# Patient Record
Sex: Female | Born: 1950
Health system: Southern US, Community
[De-identification: ages and names within clinical notes are randomized; demographics above are authoritative.]

## PROBLEM LIST (undated history)

## (undated) DIAGNOSIS — M549 Dorsalgia, unspecified: Secondary | ICD-10-CM

## (undated) DIAGNOSIS — F411 Generalized anxiety disorder: Secondary | ICD-10-CM

## (undated) DIAGNOSIS — J309 Allergic rhinitis, unspecified: Secondary | ICD-10-CM

## (undated) DIAGNOSIS — H409 Unspecified glaucoma: Secondary | ICD-10-CM

## (undated) DIAGNOSIS — Z86010 Personal history of colonic polyps: Secondary | ICD-10-CM

## (undated) DIAGNOSIS — I10 Essential (primary) hypertension: Secondary | ICD-10-CM

## (undated) DIAGNOSIS — L259 Unspecified contact dermatitis, unspecified cause: Secondary | ICD-10-CM

## (undated) HISTORY — DX: Essential (primary) hypertension: I10

## (undated) HISTORY — DX: Personal history of colonic polyps: Z86.010

## (undated) HISTORY — DX: Allergic rhinitis, unspecified: J30.9

## (undated) HISTORY — DX: Unspecified glaucoma: H40.9

## (undated) HISTORY — PX: TONSILLECTOMY AND ADENOIDECTOMY: SHX28

## (undated) HISTORY — DX: Dorsalgia, unspecified: M54.9

## (undated) HISTORY — DX: Generalized anxiety disorder: F41.1

## (undated) HISTORY — DX: Unspecified contact dermatitis, unspecified cause: L25.9

---

## 1998-09-26 ENCOUNTER — Other Ambulatory Visit: Admission: RE | Admit: 1998-09-26 | Discharge: 1998-09-26 | Payer: Self-pay | Admitting: Obstetrics and Gynecology

## 1998-11-02 ENCOUNTER — Emergency Department (HOSPITAL_COMMUNITY): Admission: EM | Admit: 1998-11-02 | Discharge: 1998-11-02 | Payer: Self-pay | Admitting: Emergency Medicine

## 1999-04-11 ENCOUNTER — Emergency Department (HOSPITAL_COMMUNITY): Admission: EM | Admit: 1999-04-11 | Discharge: 1999-04-12 | Payer: Self-pay | Admitting: *Deleted

## 1999-10-30 ENCOUNTER — Encounter: Payer: Self-pay | Admitting: Internal Medicine

## 1999-10-30 ENCOUNTER — Encounter: Admission: RE | Admit: 1999-10-30 | Discharge: 1999-10-30 | Payer: Self-pay | Admitting: Internal Medicine

## 2000-02-02 ENCOUNTER — Other Ambulatory Visit: Admission: RE | Admit: 2000-02-02 | Discharge: 2000-02-02 | Payer: Self-pay | Admitting: Obstetrics and Gynecology

## 2000-09-07 ENCOUNTER — Ambulatory Visit (HOSPITAL_COMMUNITY): Admission: RE | Admit: 2000-09-07 | Discharge: 2000-09-07 | Payer: Self-pay | Admitting: Gastroenterology

## 2000-09-07 ENCOUNTER — Encounter (INDEPENDENT_AMBULATORY_CARE_PROVIDER_SITE_OTHER): Payer: Self-pay | Admitting: *Deleted

## 2000-10-31 ENCOUNTER — Encounter: Payer: Self-pay | Admitting: Internal Medicine

## 2000-10-31 ENCOUNTER — Encounter: Admission: RE | Admit: 2000-10-31 | Discharge: 2000-10-31 | Payer: Self-pay | Admitting: Internal Medicine

## 2001-11-03 ENCOUNTER — Encounter: Payer: Self-pay | Admitting: Internal Medicine

## 2001-11-03 ENCOUNTER — Encounter: Admission: RE | Admit: 2001-11-03 | Discharge: 2001-11-03 | Payer: Self-pay | Admitting: Internal Medicine

## 2002-07-05 ENCOUNTER — Other Ambulatory Visit: Admission: RE | Admit: 2002-07-05 | Discharge: 2002-07-05 | Payer: Self-pay | Admitting: Obstetrics and Gynecology

## 2002-11-05 ENCOUNTER — Encounter: Admission: RE | Admit: 2002-11-05 | Discharge: 2002-11-05 | Payer: Self-pay | Admitting: Internal Medicine

## 2002-11-05 ENCOUNTER — Encounter: Payer: Self-pay | Admitting: Internal Medicine

## 2003-03-03 ENCOUNTER — Emergency Department (HOSPITAL_COMMUNITY): Admission: EM | Admit: 2003-03-03 | Discharge: 2003-03-03 | Payer: Self-pay | Admitting: Emergency Medicine

## 2003-09-27 ENCOUNTER — Other Ambulatory Visit: Admission: RE | Admit: 2003-09-27 | Discharge: 2003-09-27 | Payer: Self-pay | Admitting: Obstetrics and Gynecology

## 2003-10-11 ENCOUNTER — Ambulatory Visit (HOSPITAL_COMMUNITY): Admission: RE | Admit: 2003-10-11 | Discharge: 2003-10-11 | Payer: Self-pay | Admitting: Gastroenterology

## 2004-10-09 ENCOUNTER — Other Ambulatory Visit: Admission: RE | Admit: 2004-10-09 | Discharge: 2004-10-09 | Payer: Self-pay | Admitting: Obstetrics and Gynecology

## 2005-08-09 ENCOUNTER — Ambulatory Visit: Payer: Self-pay | Admitting: Internal Medicine

## 2005-08-31 ENCOUNTER — Ambulatory Visit: Payer: Self-pay | Admitting: Internal Medicine

## 2005-11-25 ENCOUNTER — Ambulatory Visit: Payer: Self-pay | Admitting: Internal Medicine

## 2005-12-06 ENCOUNTER — Ambulatory Visit: Payer: Self-pay | Admitting: Internal Medicine

## 2006-02-01 ENCOUNTER — Ambulatory Visit: Payer: Self-pay | Admitting: Internal Medicine

## 2006-05-24 ENCOUNTER — Ambulatory Visit: Payer: Self-pay | Admitting: Internal Medicine

## 2006-06-14 LAB — HM COLONOSCOPY

## 2006-08-03 ENCOUNTER — Ambulatory Visit: Payer: Self-pay | Admitting: Family Medicine

## 2006-08-17 ENCOUNTER — Ambulatory Visit: Payer: Self-pay | Admitting: Family Medicine

## 2006-09-16 ENCOUNTER — Ambulatory Visit: Payer: Self-pay

## 2007-01-27 ENCOUNTER — Encounter (INDEPENDENT_AMBULATORY_CARE_PROVIDER_SITE_OTHER): Payer: Self-pay

## 2007-01-31 DIAGNOSIS — F411 Generalized anxiety disorder: Secondary | ICD-10-CM | POA: Insufficient documentation

## 2007-01-31 DIAGNOSIS — I10 Essential (primary) hypertension: Secondary | ICD-10-CM

## 2007-01-31 HISTORY — DX: Essential (primary) hypertension: I10

## 2007-01-31 HISTORY — DX: Generalized anxiety disorder: F41.1

## 2007-03-06 ENCOUNTER — Ambulatory Visit: Payer: Self-pay | Admitting: Internal Medicine

## 2007-03-06 DIAGNOSIS — R109 Unspecified abdominal pain: Secondary | ICD-10-CM | POA: Insufficient documentation

## 2007-05-08 ENCOUNTER — Ambulatory Visit: Payer: Self-pay | Admitting: Internal Medicine

## 2007-05-08 DIAGNOSIS — L259 Unspecified contact dermatitis, unspecified cause: Secondary | ICD-10-CM | POA: Insufficient documentation

## 2007-05-08 HISTORY — DX: Unspecified contact dermatitis, unspecified cause: L25.9

## 2007-07-20 ENCOUNTER — Ambulatory Visit: Payer: Self-pay | Admitting: Internal Medicine

## 2007-08-21 ENCOUNTER — Ambulatory Visit: Payer: Self-pay | Admitting: Internal Medicine

## 2007-08-21 DIAGNOSIS — M549 Dorsalgia, unspecified: Secondary | ICD-10-CM

## 2007-08-21 HISTORY — DX: Dorsalgia, unspecified: M54.9

## 2007-09-25 ENCOUNTER — Telehealth: Payer: Self-pay | Admitting: Internal Medicine

## 2007-10-06 ENCOUNTER — Ambulatory Visit: Payer: Self-pay | Admitting: Family Medicine

## 2007-10-06 DIAGNOSIS — J309 Allergic rhinitis, unspecified: Secondary | ICD-10-CM

## 2007-10-06 HISTORY — DX: Allergic rhinitis, unspecified: J30.9

## 2008-05-24 ENCOUNTER — Encounter: Payer: Self-pay | Admitting: Internal Medicine

## 2008-08-26 ENCOUNTER — Ambulatory Visit: Payer: Self-pay | Admitting: Internal Medicine

## 2008-08-26 DIAGNOSIS — J069 Acute upper respiratory infection, unspecified: Secondary | ICD-10-CM | POA: Insufficient documentation

## 2008-11-26 ENCOUNTER — Ambulatory Visit: Payer: Self-pay | Admitting: Internal Medicine

## 2009-02-19 ENCOUNTER — Ambulatory Visit: Payer: Self-pay | Admitting: Internal Medicine

## 2009-02-19 LAB — CONVERTED CEMR LAB
ALT: 29 units/L (ref 0–35)
AST: 19 units/L (ref 0–37)
Albumin: 3.7 g/dL (ref 3.5–5.2)
Alkaline Phosphatase: 62 units/L (ref 39–117)
BUN: 15 mg/dL (ref 6–23)
Basophils Absolute: 0 10*3/uL (ref 0.0–0.1)
Basophils Relative: 0.6 % (ref 0.0–3.0)
Bilirubin Urine: NEGATIVE
Bilirubin, Direct: 0 mg/dL (ref 0.0–0.3)
CO2: 31 meq/L (ref 19–32)
Calcium: 9.2 mg/dL (ref 8.4–10.5)
Chloride: 104 meq/L (ref 96–112)
Cholesterol: 193 mg/dL (ref 0–200)
Creatinine, Ser: 0.8 mg/dL (ref 0.4–1.2)
Eosinophils Absolute: 0.1 10*3/uL (ref 0.0–0.7)
Eosinophils Relative: 3.1 % (ref 0.0–5.0)
GFR calc non Af Amer: 94.29 mL/min (ref >60)
Glucose, Bld: 101 mg/dL — ABNORMAL HIGH (ref 70–99)
Glucose, Urine, Semiquant: NEGATIVE
HCT: 43.4 % (ref 36.0–46.0)
HDL: 44.4 mg/dL (ref >39.00)
Hemoglobin: 14.8 g/dL (ref 12.0–15.0)
Ketones, urine, test strip: NEGATIVE
LDL Cholesterol: 129 mg/dL — ABNORMAL HIGH (ref 0–99)
Lymphocytes Relative: 36.1 % (ref 12.0–46.0)
Lymphs Abs: 1.7 10*3/uL (ref 0.7–4.0)
MCHC: 34.1 g/dL (ref 30.0–36.0)
MCV: 90.2 fL (ref 78.0–100.0)
Monocytes Absolute: 0.4 10*3/uL (ref 0.1–1.0)
Monocytes Relative: 9.2 % (ref 3.0–12.0)
Neutro Abs: 2.5 10*3/uL (ref 1.4–7.7)
Neutrophils Relative %: 51 % (ref 43.0–77.0)
Nitrite: NEGATIVE
Platelets: 241 10*3/uL (ref 150.0–400.0)
Potassium: 3.5 meq/L (ref 3.5–5.1)
Protein, U semiquant: NEGATIVE
RBC: 4.82 M/uL (ref 3.87–5.11)
RDW: 12.2 % (ref 11.5–14.6)
Sodium: 141 meq/L (ref 135–145)
Specific Gravity, Urine: 1.02
TSH: 0.43 microintl units/mL (ref 0.35–5.50)
Total Bilirubin: 0.8 mg/dL (ref 0.3–1.2)
Total CHOL/HDL Ratio: 4
Total Protein: 7.6 g/dL (ref 6.0–8.3)
Triglycerides: 99 mg/dL (ref 0.0–149.0)
Urobilinogen, UA: 0.2
VLDL: 19.8 mg/dL (ref 0.0–40.0)
WBC Urine, dipstick: NEGATIVE
WBC: 4.7 10*3/uL (ref 4.5–10.5)
pH: 6

## 2009-02-26 ENCOUNTER — Ambulatory Visit: Payer: Self-pay | Admitting: Internal Medicine

## 2009-02-26 DIAGNOSIS — Z860100 Personal history of colon polyps, unspecified: Secondary | ICD-10-CM

## 2009-02-26 HISTORY — DX: Personal history of colon polyps, unspecified: Z86.0100

## 2009-02-26 HISTORY — DX: Personal history of colonic polyps: Z86.010

## 2009-03-17 ENCOUNTER — Ambulatory Visit: Payer: Self-pay | Admitting: Internal Medicine

## 2009-03-17 DIAGNOSIS — T6391XA Toxic effect of contact with unspecified venomous animal, accidental (unintentional), initial encounter: Secondary | ICD-10-CM | POA: Insufficient documentation

## 2009-08-07 ENCOUNTER — Ambulatory Visit: Payer: Self-pay | Admitting: Internal Medicine

## 2009-08-08 ENCOUNTER — Encounter: Payer: Self-pay | Admitting: Internal Medicine

## 2009-08-18 ENCOUNTER — Ambulatory Visit: Payer: Self-pay | Admitting: Internal Medicine

## 2009-10-01 ENCOUNTER — Encounter (INDEPENDENT_AMBULATORY_CARE_PROVIDER_SITE_OTHER): Payer: Self-pay | Admitting: Obstetrics and Gynecology

## 2009-10-01 ENCOUNTER — Ambulatory Visit (HOSPITAL_COMMUNITY): Admission: RE | Admit: 2009-10-01 | Discharge: 2009-10-02 | Payer: Self-pay | Admitting: Obstetrics and Gynecology

## 2010-01-05 ENCOUNTER — Emergency Department (HOSPITAL_COMMUNITY): Admission: EM | Admit: 2010-01-05 | Discharge: 2010-01-05 | Payer: Self-pay | Admitting: Emergency Medicine

## 2010-01-15 ENCOUNTER — Ambulatory Visit: Payer: Self-pay | Admitting: Internal Medicine

## 2010-02-20 ENCOUNTER — Ambulatory Visit: Payer: Self-pay | Admitting: Internal Medicine

## 2010-02-20 LAB — CONVERTED CEMR LAB
ALT: 25 units/L (ref 0–35)
AST: 17 units/L (ref 0–37)
Albumin: 3.9 g/dL (ref 3.5–5.2)
Alkaline Phosphatase: 64 units/L (ref 39–117)
BUN: 13 mg/dL (ref 6–23)
Basophils Absolute: 0 10*3/uL (ref 0.0–0.1)
Basophils Relative: 0.7 % (ref 0.0–3.0)
Bilirubin Urine: NEGATIVE
Bilirubin, Direct: 0.1 mg/dL (ref 0.0–0.3)
CO2: 29 meq/L (ref 19–32)
Calcium: 9.3 mg/dL (ref 8.4–10.5)
Chloride: 104 meq/L (ref 96–112)
Cholesterol: 201 mg/dL — ABNORMAL HIGH (ref 0–200)
Creatinine, Ser: 0.6 mg/dL (ref 0.4–1.2)
Direct LDL: 143.7 mg/dL
Eosinophils Absolute: 0.1 10*3/uL (ref 0.0–0.7)
Eosinophils Relative: 2.2 % (ref 0.0–5.0)
GFR calc non Af Amer: 123.8 mL/min (ref >60)
Glucose, Bld: 92 mg/dL (ref 70–99)
Glucose, Urine, Semiquant: NEGATIVE
HCT: 41.6 % (ref 36.0–46.0)
HDL: 47.5 mg/dL (ref >39.00)
Hemoglobin: 14.3 g/dL (ref 12.0–15.0)
Ketones, urine, test strip: NEGATIVE
Lymphocytes Relative: 35.9 % (ref 12.0–46.0)
Lymphs Abs: 1.8 10*3/uL (ref 0.7–4.0)
MCHC: 34.4 g/dL (ref 30.0–36.0)
MCV: 89.4 fL (ref 78.0–100.0)
Monocytes Absolute: 0.5 10*3/uL (ref 0.1–1.0)
Monocytes Relative: 10.2 % (ref 3.0–12.0)
Neutro Abs: 2.6 10*3/uL (ref 1.4–7.7)
Neutrophils Relative %: 51 % (ref 43.0–77.0)
Nitrite: NEGATIVE
Platelets: 247 10*3/uL (ref 150.0–400.0)
Potassium: 3.8 meq/L (ref 3.5–5.1)
Protein, U semiquant: NEGATIVE
RBC: 4.66 M/uL (ref 3.87–5.11)
RDW: 13.1 % (ref 11.5–14.6)
Sodium: 141 meq/L (ref 135–145)
Specific Gravity, Urine: 1.025
TSH: 0.45 microintl units/mL (ref 0.35–5.50)
Total Bilirubin: 0.7 mg/dL (ref 0.3–1.2)
Total CHOL/HDL Ratio: 4
Total Protein: 7 g/dL (ref 6.0–8.3)
Triglycerides: 79 mg/dL (ref 0.0–149.0)
Urobilinogen, UA: 0.2
VLDL: 15.8 mg/dL (ref 0.0–40.0)
WBC Urine, dipstick: NEGATIVE
WBC: 5.1 10*3/uL (ref 4.5–10.5)
pH: 6

## 2010-03-02 ENCOUNTER — Ambulatory Visit: Payer: Self-pay | Admitting: Internal Medicine

## 2010-04-01 ENCOUNTER — Encounter: Payer: Self-pay | Admitting: Internal Medicine

## 2010-05-29 ENCOUNTER — Ambulatory Visit: Payer: Self-pay | Admitting: Internal Medicine

## 2010-05-29 DIAGNOSIS — L03039 Cellulitis of unspecified toe: Secondary | ICD-10-CM | POA: Insufficient documentation

## 2010-07-16 NOTE — Letter (Signed)
Summary: [facility]  [facility]   Imported By: Georgian Co 04/01/2010 14:29:46  _____________________________________________________________________  External Attachment:    Type:   Image     Comment:   External Document

## 2010-07-16 NOTE — Assessment & Plan Note (Signed)
Summary: F/U - H/A, SINUS PROBLEMS, ST // RS   Vital Signs:  Patient profile:   60 year old female Weight:      181 pounds Temp:     98.6 degrees F oral BP sitting:   132 / 90  (right arm) Cuff size:   regular  Vitals Entered By: Duard Brady LPN (August 18, 1608 11:12 AM) CC: c/o still with sinus drainage and pressure - stomach issues improved       not doing allergy injections until sinus issues have cleared Is Patient Diabetic? No   CC:  c/o still with sinus drainage and pressure - stomach issues improved       not doing allergy injections until sinus issues have cleared.  History of Present Illness:  60 year old patient who is seen today complaining of sinus congestion, drainage, and pressure.  No fever, localized sinus pain or purulent drainage.  She has been using  saline irrigation, and has been on her maintenance Allegra-D, and Astelin nasal spray.  She has treated hypertension, which has been stable  Allergies: 1)  ! Penicillin G Potassium (Penicillin G Potassium) 2)  ! Biaxin (Clarithromycin) 3)  ! Doxycycline Hyclate (Doxycycline Hyclate) 4)  ! Cephalosporins 5)  ! Carbaphen 12 (Phenyleph-Chlorphen-Carbetapen)  Past History:  Past Medical History: Reviewed history from 02/26/2009 and no changes required. Anxiety Hypertension Allergic rhinitis hand eczema Colonic polyps, hx of  Review of Systems       The patient complains of anorexia and hoarseness.  The patient denies fever, weight loss, weight gain, vision loss, decreased hearing, chest pain, syncope, dyspnea on exertion, peripheral edema, prolonged cough, headaches, hemoptysis, abdominal pain, melena, hematochezia, severe indigestion/heartburn, hematuria, incontinence, genital sores, muscle weakness, suspicious skin lesions, transient blindness, difficulty walking, depression, unusual weight change, abnormal bleeding, enlarged lymph nodes, angioedema, and breast masses.    Physical Exam  General:   overweight-appearing.  no distress.  Normal blood pressure Head:  Normocephalic and atraumatic without obvious abnormalities. No apparent alopecia or balding. Eyes:  No corneal or conjunctival inflammation noted. EOMI. Perrla. Funduscopic exam benign, without hemorrhages, exudates or papilledema. Vision grossly normal. Ears:  External ear exam shows no significant lesions or deformities.  Otoscopic examination reveals clear canals, tympanic membranes are intact bilaterally without bulging, retraction, inflammation or discharge. Hearing is grossly normal bilaterally. Nose:  External nasal examination shows no deformity or inflammation. Nasal mucosa are pink and moist without lesions or exudates. Mouth:  Oral mucosa and oropharynx without lesions or exudates.  Teeth in good repair. Neck:  No deformities, masses, or tenderness noted. Lungs:  Normal respiratory effort, chest expands symmetrically. Lungs are clear to auscultation, no crackles or wheezes. Heart:  Normal rate and regular rhythm. S1 and S2 normal without gallop, murmur, click, rub or other extra sounds.   Impression & Recommendations:  Problem # 1:  URI (ICD-465.9)  Her updated medication list for this problem includes:    Allegra-d 12 Hour 60-120 Mg Tb12 (Fexofenadine-pseudoephedrine) .Marland Kitchen... 1 two times a day  Problem # 2:  ALLERGIC RHINITIS (ICD-477.9)  Her updated medication list for this problem includes:    Astelin 137 Mcg/spray Soln (Azelastine hcl) ..... Use as dir    Fluticasone Propionate 50 Mcg/act Susp (Fluticasone propionate) ..... Use  daily  Problem # 3:  HYPERTENSION (ICD-401.9)  Her updated medication list for this problem includes:    Hydrochlorothiazide 25 Mg Tabs (Hydrochlorothiazide) .Marland Kitchen... 1 once daily  Complete Medication List: 1)  Hydrochlorothiazide 25 Mg Tabs (Hydrochlorothiazide) .Marland KitchenMarland KitchenMarland Kitchen  1 once daily 2)  Allegra-d 12 Hour 60-120 Mg Tb12 (Fexofenadine-pseudoephedrine) .Marland Kitchen.. 1 two times a day 3)  Astelin 137  Mcg/spray Soln (Azelastine hcl) .... Use as dir 4)  Triamcinolone Acetonide 0.1 % Oint (Triamcinolone acetonide) .... Use twice daily 5)  Allergy Inj  .... Weekly 6)  Tums 500 Mg Chew (Calcium carbonate antacid) .... One once daily 7)  Prednisone 20 Mg Tabs (Prednisone) .... One twice daily 8)  Fluticasone Propionate 50 Mcg/act Susp (Fluticasone propionate) .... Use  daily  Patient Instructions: 1)  Get plenty of rest, drink lots of clear liquids, and use Tylenol or Ibuprofen for fever and comfort. Prescriptions: FLUTICASONE PROPIONATE 50 MCG/ACT SUSP (FLUTICASONE PROPIONATE) use  daily  #1 x 4   Entered and Authorized by:   Gordy Savers  MD   Signed by:   Gordy Savers  MD on 08/18/2009   Method used:   Print then Give to Patient   RxID:   [id number] PREDNISONE 20 MG  TABS (PREDNISONE) one twice daily  #30 x 0   Entered and Authorized by:   Gordy Savers  MD   Signed by:   Gordy Savers  MD on 08/18/2009   Method used:   Print then Give to Patient   RxID:   [id number] PREDNISONE 20 MG  TABS (PREDNISONE) one twice daily  #30 x 0   Entered and Authorized by:   Gordy Savers  MD   Signed by:   Gordy Savers  MD on 08/18/2009   Method used:   [facility]        [facility] [phone][facility] (retail)       [address]       [place], [place]  [address]       Ph: [phone]       Fax: [phone]   RxID:   [id number] FLUTICASONE PROPIONATE 50 MCG/ACT SUSP (FLUTICASONE PROPIONATE) use  daily  #1 x 4   Entered and Authorized by:   Gordy Savers  MD   Signed by:   Gordy Savers  MD on 08/18/2009   Method used:   [facility]        [facility] [phone]* (retail)       [address]       [place], [place]  [address]       Ph: [phone]       Fax: [phone]   RxID:   [id number]

## 2010-07-16 NOTE — Assessment & Plan Note (Signed)
Summary: check great toe/some bleeding in corner and white area in nai...   Vital Signs:  Patient profile:   60 year old female Weight:      175 pounds Temp:     98.0 degrees F oral BP sitting:   110 / 80  (left arm) Cuff size:   regular  Vitals Entered By: Duard Brady LPN (May 29, 2010 11:13 AM) CC: (R) great oe??infection   c/o stomach issue Is Patient Diabetic? No   CC:  (R) great oe??infection   c/o stomach issue.  History of Present Illness: 60 year old patient who is complaining of a painful right great toe.  She has treated hypertension, and a history of allergic rhinitis.  Otherwise, she has done quite well except for some resolving GI issues from a viral gastroenteritis  Allergies: 1)  ! Penicillin G Potassium (Penicillin G Potassium) 2)  ! Biaxin (Clarithromycin) 3)  ! Doxycycline Hyclate (Doxycycline Hyclate) 4)  ! Cephalosporins 5)  ! Carbaphen 12 (Phenyleph-Chlorphen-Carbetapen)  Past History:  Past Medical History: Reviewed history from 02/26/2009 and no changes required. Anxiety Hypertension Allergic rhinitis hand eczema Colonic polyps, hx of  Review of Systems       The patient complains of abdominal pain and suspicious skin lesions.  The patient denies anorexia, fever, weight loss, weight gain, vision loss, decreased hearing, hoarseness, chest pain, syncope, dyspnea on exertion, peripheral edema, prolonged cough, headaches, hemoptysis, melena, hematochezia, severe indigestion/heartburn, hematuria, incontinence, genital sores, muscle weakness, transient blindness, difficulty walking, depression, unusual weight change, abnormal bleeding, enlarged lymph nodes, angioedema, and breast masses.    Physical Exam  General:  overweight-appearing.  normal blood pressure.  Afebrile. Msk:  a mild paronychia involving her lateral aspect of the right great toe   Impression & Recommendations:  Problem # 1:  HYPERTENSION (ICD-401.9)  Her updated  medication list for this problem includes:    Hydrochlorothiazide 25 Mg Tabs (Hydrochlorothiazide) .Marland Kitchen... 1 once daily  Problem # 2:  PARONYCHIA, RIGHT GREAT TOE (ICD-681.11)  Her updated medication list for this problem includes:    Ciprofloxacin Hcl 500 Mg Tabs (Ciprofloxacin hcl) ..... One twice daily  Complete Medication List: 1)  Hydrochlorothiazide 25 Mg Tabs (Hydrochlorothiazide) .Marland Kitchen.. 1 once daily 2)  Allegra-d 12 Hour 60-120 Mg Tb12 (Fexofenadine-pseudoephedrine) .Marland Kitchen.. 1 two times a day 3)  Astelin 137 Mcg/spray Soln (Azelastine hcl) .... Use as dir 4)  Triamcinolone Acetonide 0.1 % Oint (Triamcinolone acetonide) .... Use twice daily 5)  Allergy Inj  .... Weekly 6)  Tums 500 Mg Chew (Calcium carbonate antacid) .... One once daily 7)  Fluticasone Propionate 50 Mcg/act Susp (Fluticasone propionate) .... Use  daily 8)  Lorazepam 0.5 Mg Tabs (Lorazepam) .... One twice daily as needed 9)  Ciprofloxacin Hcl 500 Mg Tabs (Ciprofloxacin hcl) .... One twice daily  Patient Instructions: 1)  Please schedule a follow-up appointment in 6 months. 2)  Limit your Sodium (Salt) to less than 2 grams a day(slightly less than 1/2 a teaspoon) to prevent fluid retention, swelling, or worsening of symptoms. 3)  Take your antibiotic as prescribed until ALL of it is gone, but stop if you develop a rash or swelling and contact our office as soon as possible. 4)  ALIGN ONE DAILY  Prescriptions: CIPROFLOXACIN HCL 500 MG TABS (CIPROFLOXACIN HCL) one twice daily  #14 x 0   Entered and Authorized by:   Gordy Savers  MD   Signed by:   Gordy Savers  MD on 05/29/2010   Method  used:   [facility]        [facility]       [address]       [place], [place]  [address]       Ph: [phone]       Fax: [phone]   RxID:   [id number]    Orders Added: 1)  Est. Patient Level III [[address]]

## 2010-07-16 NOTE — Assessment & Plan Note (Signed)
Summary: cpx no pap//ccm/pt rescd//ccm   Vital Signs:  Patient profile:   60 year old female Height:      61.5 inches Weight:      176 pounds BMI:     32.83 Temp:     98.4 degrees F oral BP sitting:   128 / 72  (right arm) Cuff size:   regular  Vitals Entered By: Duard Brady LPN (March 02, 2010 2:39 PM) CC: cpx - nasal congestion  Is Patient Diabetic? No   CC:  cpx - nasal congestion .  History of Present Illness: 60 -year-old patient who is seen today for a health maintenance examination.  Medical problems include hypertension, allergic rhinitis, and history of colonic polyps.  She is followed closely by OB/GYN for her annual gynecologic care.  She had this earlier this summer.  No concerns or complaints.  Allergies: 1)  ! Penicillin G Potassium (Penicillin G Potassium) 2)  ! Biaxin (Clarithromycin) 3)  ! Doxycycline Hyclate (Doxycycline Hyclate) 4)  ! Cephalosporins 5)  ! Carbaphen 12 (Phenyleph-Chlorphen-Carbetapen)  Past History:  Past Medical History: Reviewed history from 02/26/2009 and no changes required. Anxiety Hypertension Allergic rhinitis hand eczema Colonic polyps, hx of  Past Surgical History: T&A Colonoscopy ([facility]) 2008   GYN Tenny [facility])  Family History: Reviewed history from 02/26/2009 and no changes required. Family History of Colon CA 1st degree relative <60 Family History of Cardiovascular disorder   2  Brothers, one sister with colon CA, Hodgkin's Disease Father- died colon Ca 5 brothers, one sister, colon ca, Hodgins Disease Mother-HTN, died CVA  Social History: Reviewed history from 02/26/2009 and no changes required. Occupation: Married Never Smoked Regular exercise-yes  Review of Systems  The patient denies anorexia, fever, weight loss, weight gain, vision loss, decreased hearing, hoarseness, chest pain, syncope, dyspnea on exertion, peripheral edema, prolonged cough, headaches, hemoptysis, abdominal pain, melena,  hematochezia, severe indigestion/heartburn, hematuria, incontinence, genital sores, muscle weakness, suspicious skin lesions, transient blindness, difficulty walking, depression, unusual weight change, abnormal bleeding, enlarged lymph nodes, angioedema, and breast masses.    Physical Exam  General:  overweight-appearing.  144/80overweight-appearing.   Head:  Normocephalic and atraumatic without obvious abnormalities. No apparent alopecia or balding. Eyes:  No corneal or conjunctival inflammation noted. EOMI. Perrla. Funduscopic exam benign, without hemorrhages, exudates or papilledema. Vision grossly normal. Ears:  External ear exam shows no significant lesions or deformities.  Otoscopic examination reveals clear canals, tympanic membranes are intact bilaterally without bulging, retraction, inflammation or discharge. Hearing is grossly normal bilaterally. Nose:  External nasal examination shows no deformity or inflammation. Nasal mucosa are pink and moist without lesions or exudates. Mouth:  Oral mucosa and oropharynx without lesions or exudates.  Teeth in good repair. Neck:  No deformities, masses, or tenderness noted. Chest Wall:  No deformities, masses, or tenderness noted. Lungs:  Normal respiratory effort, chest expands symmetrically. Lungs are clear to auscultation, no crackles or wheezes. Heart:  Normal rate and regular rhythm. S1 and S2 normal without gallop, murmur, click, rub or other extra sounds. Abdomen:  Bowel sounds positive,abdomen soft and non-tender without masses, organomegaly or hernias noted. Msk:  No deformity or scoliosis noted of thoracic or lumbar spine.   Pulses:  R and L carotid,radial,femoral,dorsalis pedis and posterior tibial pulses are full and equal bilaterally Extremities:  No clubbing, cyanosis, edema, or deformity noted with normal full range of motion of all joints.   Neurologic:  No cranial nerve deficits noted. Station and gait are normal. Plantar reflexes are  down-going bilaterally. DTRs are symmetrical throughout. Sensory, motor and coordinative functions appear intact. Skin:  Intact without suspicious lesions or rashes Cervical Nodes:  No lymphadenopathy noted Axillary Nodes:  No palpable lymphadenopathy Inguinal Nodes:  No significant adenopathy Psych:  Cognition and judgment appear intact. Alert and cooperative with normal attention span and concentration. No apparent delusions, illusions, hallucinations   Impression & Recommendations:  Problem # 1:  Preventive Health Care (ICD-V70.0)  Complete Medication List: 1)  Hydrochlorothiazide 25 Mg Tabs (Hydrochlorothiazide) .Marland Kitchen.. 1 once daily 2)  Allegra-d 12 Hour 60-120 Mg Tb12 (Fexofenadine-pseudoephedrine) .Marland Kitchen.. 1 two times a day 3)  Astelin 137 Mcg/spray Soln (Azelastine hcl) .... Use as dir 4)  Triamcinolone Acetonide 0.1 % Oint (Triamcinolone acetonide) .... Use twice daily 5)  Allergy Inj  .... Weekly 6)  Tums 500 Mg Chew (Calcium carbonate antacid) .... One once daily 7)  Fluticasone Propionate 50 Mcg/act Susp (Fluticasone propionate) .... Use  daily 8)  Lorazepam 0.5 Mg Tabs (Lorazepam) .... One twice daily as needed  Other Orders: Re-certification of Home Health [phone]) EKG w/ Interpretation (93000)  Patient Instructions: 1)  Please schedule a follow-up appointment in 4 months. 2)  Limit your Sodium (Salt) to less than 2 grams a day(slightly less than 1/2 a teaspoon) to prevent fluid retention, swelling, or worsening of symptoms. 3)  It is important that you exercise regularly at least 20 minutes 5 times a week. If you develop chest pain, have severe difficulty breathing, or feel very tired , stop exercising immediately and seek medical attention. 4)  You need to lose weight. Consider a lower calorie diet and regular exercise.  5)  Check your Blood Pressure regularly. If it is above: 150/90 you should make an appointment.

## 2010-07-16 NOTE — Assessment & Plan Note (Signed)
Summary: COUGH, SORE THROAT // RS   Vital Signs:  Patient profile:   60 year old female Weight:      183 pounds Temp:     98.6 degrees F oral BP sitting:   148 / 70  (left arm) Cuff size:   regular  Vitals Entered By: Duard Brady LPN (August 07, 2009 10:25 AM) CC: c/o stomach ache , body aches , chills and diarrhea , no n/v or fever Is Patient Diabetic? No   CC:  c/o stomach ache , body aches , chills and diarrhea , and no n/v or fever.  History of Present Illness: 60 year old patient who is seen today for follow-up of her hypertension.  She also has allergic rhinitis.  For the past few days.  She has had some stomach upset with diarrhea.  This has largely resolved.  She continues  to have some weakness, mild sore throat, and chills.  Preventive Screening-Counseling & Management  Alcohol-Tobacco     Smoking Status: never  Allergies: 1)  ! Penicillin G Potassium (Penicillin G Potassium) 2)  ! Biaxin (Clarithromycin) 3)  ! Doxycycline Hyclate (Doxycycline Hyclate)  Past History:  Past Medical History: Reviewed history from 02/26/2009 and no changes required. Anxiety Hypertension Allergic rhinitis hand eczema Colonic polyps, hx of  Past Surgical History: Reviewed history from 02/26/2009 and no changes required. T&A Colonoscopy ([facility])   GYN ([facility])  Review of Systems       The patient complains of anorexia, fever, and abdominal pain.  The patient denies weight loss, weight gain, vision loss, decreased hearing, hoarseness, chest pain, syncope, dyspnea on exertion, peripheral edema, prolonged cough, headaches, hemoptysis, melena, hematochezia, severe indigestion/heartburn, hematuria, incontinence, genital sores, muscle weakness, suspicious skin lesions, transient blindness, difficulty walking, depression, unusual weight change, abnormal bleeding, enlarged lymph nodes, angioedema, and breast masses.    Physical Exam  General:  overweight-appearing.   140/82overweight-appearing.   Head:  Normocephalic and atraumatic without obvious abnormalities. No apparent alopecia or balding. Eyes:  No corneal or conjunctival inflammation noted. EOMI. Perrla. Funduscopic exam benign, without hemorrhages, exudates or papilledema. Vision grossly normal. Mouth:  Oral mucosa and oropharynx without lesions or exudates.  Teeth in good repair. Neck:  No deformities, masses, or tenderness noted. Lungs:  Normal respiratory effort, chest expands symmetrically. Lungs are clear to auscultation, no crackles or wheezes. Heart:  Normal rate and regular rhythm. S1 and S2 normal without gallop, murmur, click, rub or other extra sounds. Abdomen:  Bowel sounds positive,abdomen soft and non-tender without masses, organomegaly or hernias noted. Msk:  No deformity or scoliosis noted of thoracic or lumbar spine.   Pulses:  R and L carotid,radial,femoral,dorsalis pedis and posterior tibial pulses are full and equal bilaterally Extremities:  No clubbing, cyanosis, edema, or deformity noted with normal full range of motion of all joints.     Impression & Recommendations:  Problem # 1:  URI (ICD-465.9)  Her updated medication list for this problem includes:    Allegra-d 12 Hour 60-120 Mg Tb12 (Fexofenadine-pseudoephedrine) .Marland Kitchen... 1 two times a day  Her updated medication list for this problem includes:    Allegra-d 12 Hour 60-120 Mg Tb12 (Fexofenadine-pseudoephedrine) .Marland Kitchen... 1 two times a day  Problem # 2:  HYPERTENSION (ICD-401.9)  Her updated medication list for this problem includes:    Hydrochlorothiazide 25 Mg Tabs (Hydrochlorothiazide) .Marland Kitchen... 1 once daily    Her updated medication list for this problem includes:    Hydrochlorothiazide 25 Mg Tabs (Hydrochlorothiazide) .Marland Kitchen... 1 once daily  Complete  Medication List: 1)  Hydrochlorothiazide 25 Mg Tabs (Hydrochlorothiazide) .Marland Kitchen.. 1 once daily 2)  Allegra-d 12 Hour 60-120 Mg Tb12 (Fexofenadine-pseudoephedrine) .Marland Kitchen.. 1 two  times a day 3)  Astelin 137 Mcg/spray Soln (Azelastine hcl) .... Use as dir 4)  Triamcinolone Acetonide 0.1 % Oint (Triamcinolone acetonide) .... Use twice daily 5)  Allergy Inj  .... Weekly 6)  Tums 500 Mg Chew (Calcium carbonate antacid) .... One once daily 7)  Prednisone 20 Mg Tabs (Prednisone) .... Uad  Other Orders: Prescription Created Electronically [id number])  Patient Instructions: 1)  Please schedule a follow-up appointment in 6 months. 2)  Limit your Sodium (Salt). 3)  It is important that you exercise regularly at least 20 minutes 5 times a week. If you develop chest pain, have severe difficulty breathing, or feel very tired , stop exercising immediately and seek medical attention. 4)  You need to lose weight. Consider a lower calorie diet and regular exercise.  5)  Check your Blood Pressure regularly. If it is above: 150/90 you should make an appointment. Prescriptions: ASTELIN 137 MCG/SPRAY  SOLN (AZELASTINE HCL) use as dir  #3 x 6   Entered and Authorized by:   Gordy Savers  MD   Signed by:   Gordy Savers  MD on 08/07/2009   Method used:   [facility]        [facility] [phone]* (retail)       [address]       [place], [place]  [address]       Ph: [phone]       Fax: [phone]   RxID:   [id number] ALLEGRA-D 12 HOUR 60-120 MG  TB12 (FEXOFENADINE-PSEUDOEPHEDRINE) 1 two times a day  #100 x 6   Entered and Authorized by:   Gordy Savers  MD   Signed by:   Gordy Savers  MD on 08/07/2009   Method used:   [facility]        [facility] [id number] ([facility]       [address]       [place], [place]  [address]       Ph: [phone]       Fax: [phone]   RxID:   [id number] HYDROCHLOROTHIAZIDE 25 MG  TABS (HYDROCHLOROTHIAZIDE) 1 once daily  #90 x 6   Entered and Authorized by:   Gordy Savers  MD   Signed by:   Gordy Savers  MD on 08/07/2009   Method  used:   [facility]        [facility] [phone]* (retail)       [address]       [place], [place]  [address]       Ph: [phone]       Fax: [phone]   RxID:   [id number]

## 2010-07-16 NOTE — Medication Information (Signed)
Summary: Coverage Approval for Fexofenadine  Coverage Approval for Fexofenadine   Imported By: Maryln Gottron 08/12/2009 13:46:50  _____________________________________________________________________  External Attachment:    Type:   Image     Comment:   External Document

## 2010-07-16 NOTE — Assessment & Plan Note (Signed)
Summary: stitches removal from finger/njr   Vital Signs:  Becker profile:   60 year old female Weight:      180 pounds Temp:     98.0 degrees F oral BP sitting:   110 / 70  (right arm) Cuff size:   regular  Vitals Entered By: Duard Brady LPN (January 15, 2010 11:25 AM) CC: stitches to be removed (R) ring finger Is Becker Diabetic? No   CC:  stitches to be removed (R) ring finger.  History of Present Illness: Kristy Becker who is seen today for suture removal involving the left fourth finger.  She has a history of hypertension, which has been well-controlled.  No concerns or complaints today except for some mild anxiety issues  Allergies: 1)  ! Penicillin G Potassium (Penicillin G Potassium) 2)  ! Biaxin (Clarithromycin) 3)  ! Doxycycline Hyclate (Doxycycline Hyclate) 4)  ! Cephalosporins 5)  ! Carbaphen 12 (Phenyleph-Chlorphen-Carbetapen)  Physical Exam  General:  Well-developed,well-nourished,in no acute distress; alert,appropriate and cooperative throughout examination;  110/70 Skin:  3 sutures removed from the palmar aspect of her left fourth finger without difficulty   Impression & Recommendations:  Problem # 1:  ENCOUNTER FOR REMOVAL OF SUTURES (ICD-V58.32)  Problem # 2:  HYPERTENSION (ICD-401.9)  Her updated medication list for this problem includes:    Hydrochlorothiazide 25 Mg Tabs (Hydrochlorothiazide) .Marland Kitchen... 1 once daily  Complete Medication List: 1)  Hydrochlorothiazide 25 Mg Tabs (Hydrochlorothiazide) .Marland Kitchen.. 1 once daily 2)  Allegra-d 12 Hour 60-120 Mg Tb12 (Fexofenadine-pseudoephedrine) .Marland Kitchen.. 1 two times a day 3)  Astelin 137 Mcg/spray Soln (Azelastine hcl) .... Use as dir 4)  Triamcinolone Acetonide 0.1 % Oint (Triamcinolone acetonide) .... Use twice daily 5)  Allergy Inj  .... Weekly 6)  Tums 500 Mg Chew (Calcium carbonate antacid) .... One once daily 7)  Fluticasone Propionate 50 Mcg/act Susp (Fluticasone propionate) .... Use  daily 8)   Lorazepam 0.5 Mg Tabs (Lorazepam) .... One twice daily as needed  Becker Instructions: 1)  Limit your Sodium (Salt) to less than 2 grams a day(slightly less than 1/2 a teaspoon) to prevent fluid retention, swelling, or worsening of symptoms. 2)  It is important that you exercise regularly at least 20 minutes 5 times a week. If you develop chest pain, have severe difficulty breathing, or feel very tired , stop exercising immediately and seek medical attention. 3)  You need to lose weight. Consider a lower calorie diet and regular exercise.  Prescriptions: LORAZEPAM 0.5 MG TABS (LORAZEPAM) one twice daily as needed  #50 x 2   Entered and Authorized by:   Gordy Savers  MD   Signed by:   Gordy Savers  MD on 01/15/2010   Method used:   Print then Give to Becker   RxID:   [id number] FLUTICASONE PROPIONATE 50 MCG/ACT SUSP (FLUTICASONE PROPIONATE) use  daily  #1 x 4   Entered and Authorized by:   Gordy Savers  MD   Signed by:   Gordy Savers  MD on 01/15/2010   Method used:   Print then Give to Becker   RxID:   [id number] ASTELIN 137 MCG/SPRAY  SOLN (AZELASTINE HCL) use as dir  #3 x 6   Entered and Authorized by:   Gordy Savers  MD   Signed by:   Gordy Savers  MD on 01/15/2010   Method used:   Print then Give to Becker   RxID:   [id number] ALLEGRA-D 12 HOUR  60-120 MG  TB12 (FEXOFENADINE-PSEUDOEPHEDRINE) 1 two times a day  #100 x 6   Entered and Authorized by:   Gordy Savers  MD   Signed by:   Gordy Savers  MD on 01/15/2010   Method used:   Print then Give to Becker   RxID:   [id number] HYDROCHLOROTHIAZIDE 25 MG  TABS (HYDROCHLOROTHIAZIDE) 1 once daily  #100 Tablet x 2   Entered and Authorized by:   Gordy Savers  MD   Signed by:   Gordy Savers  MD on 01/15/2010   Method used:   Print then Give to Becker   RxID:   [id number]    Immunization History:  Tetanus/Td Immunization History:     Tetanus/Td:  historical (09/12/2009)

## 2010-07-25 ENCOUNTER — Other Ambulatory Visit: Payer: Self-pay | Admitting: Internal Medicine

## 2010-07-25 DIAGNOSIS — I10 Essential (primary) hypertension: Secondary | ICD-10-CM

## 2010-07-27 ENCOUNTER — Telehealth: Payer: Self-pay

## 2010-07-27 NOTE — Telephone Encounter (Signed)
Open in error

## 2010-09-01 LAB — URINE MICROSCOPIC-ADD ON

## 2010-09-01 LAB — CBC
HCT: 46.3 % — ABNORMAL HIGH (ref 36.0–46.0)
Hemoglobin: 13.2 g/dL (ref 12.0–15.0)
Hemoglobin: 15.7 g/dL — ABNORMAL HIGH (ref 12.0–15.0)
MCHC: 34.3 g/dL (ref 30.0–36.0)
Platelets: 230 10*3/uL (ref 150–400)
RBC: 5.09 MIL/uL (ref 3.87–5.11)
RDW: 12.7 % (ref 11.5–15.5)
WBC: 5.8 10*3/uL (ref 4.0–10.5)

## 2010-09-01 LAB — HEMOGLOBIN AND HEMATOCRIT, BLOOD
HCT: 39.4 % (ref 36.0–46.0)
Hemoglobin: 13.2 g/dL (ref 12.0–15.0)

## 2010-09-01 LAB — URINALYSIS, ROUTINE W REFLEX MICROSCOPIC
Glucose, UA: NEGATIVE mg/dL
Leukocytes, UA: NEGATIVE
Protein, ur: NEGATIVE mg/dL
pH: 6 (ref 5.0–8.0)

## 2010-09-01 LAB — PROTIME-INR
INR: 1.01 (ref 0.00–1.49)
Prothrombin Time: 13.2 seconds (ref 11.6–15.2)

## 2010-09-01 LAB — COMPREHENSIVE METABOLIC PANEL
ALT: 29 U/L (ref 0–35)
Alkaline Phosphatase: 59 U/L (ref 39–117)
BUN: 9 mg/dL (ref 6–23)
Chloride: 99 mEq/L (ref 96–112)
Glucose, Bld: 87 mg/dL (ref 70–99)
Potassium: 3 mEq/L — ABNORMAL LOW (ref 3.5–5.1)
Sodium: 137 mEq/L (ref 135–145)
Total Bilirubin: 0.5 mg/dL (ref 0.3–1.2)

## 2010-10-30 NOTE — Procedures (Signed)
[place]  Patient:    Kristy Becker, Kristy Becker                     MRN: [id number] Proc. Date: 09/07/00 Adm. Date:  [id number] Attending:  Orland Mustard CC:         Gordy Savers, M.D.   Procedure Report  PROCEDURE PERFORMED:  Colonoscopy with removal of polyp.  ENDOSCOPIST:  Llana Aliment. Randa Evens, M.D.  MEDICATIONS USED:  Fentanyl 80 mcg, Versed 10 mg IV.  INSTRUMENT:  Adult Olympus video colonoscope.  INDICATIONS:  Three-year follow-up for previous colon polyps.  DESCRIPTION OF PROCEDURE:  The procedure had been explained to the patient and consent obtained.  With the patient in the left lateral decubitus position, a digital exam was performed.  There were no abnormalities.  The adult Olympus video colonoscope was inserted.  There was moderate diverticular disease more so than previous exam and we were finally able to pass this and advance to the cecum.  The right lower quadrant was transilluminated and the ileocecal valve was seen.  The scope was withdrawn.  The cecum, ascending colon, hepatic flexure, transverse colon, splenic flexure, descending and sigmoid colon were seen well.  In the proximal rectum a 1.5 cm polyp was removed.  It was sessile and there was no active bleeding.  No other polyps were seen in the rectum. Scope withdrawn, patient tolerated the procedure well.  ASSESSMENT: 1. Rectal polyp removed. 2. Moderate diverticulosis more so than on previous colonoscopy.  PLAN: 1. Routine postpolypectomy instructions. 2. Recommend repeating in three years and will plan on using pediatric    colonoscope. DD:  09/07/00 TD:  09/07/00 Job: [id number]

## 2010-10-30 NOTE — Op Note (Signed)
NAME:  Kristy Becker, Kristy Becker                        ACCOUNT NO.:  [id number]   MEDICAL RECORD NO.:  [id number]                   PATIENT TYPE:  AMB   LOCATION:  ENDO                                 FACILITY:  [facility]   PHYSICIAN:  James L. Malon Kindle., M.D.          DATE OF BIRTH:  1951-03-17   DATE OF PROCEDURE:  10/11/2003  DATE OF DISCHARGE:                                 OPERATIVE REPORT   PROCEDURE:  Colonoscopy.   MEDICATIONS:  Fentanyl 100 mcg, Versed 10 mg IV.   SCOPE:  Olympus pediatric colonoscope.   INDICATIONS:  Previous history of colon polyps.   DESCRIPTION OF PROCEDURE:  The procedure had been explained to the patient  and consent obtained.   With the patient in the left lateral decubitus position, the scope was  inserted and advanced.  The prep was excellent.  The patient had marked  diverticular disease.  Eventually we were able to pass the sigmoid colon and  advance rapidly to the cecum.  The ileocecal valve and appendiceal orifice  were seen.  The scope was withdrawn and the colon carefully examined upon  withdrawal.  The ascending colon, transverse colon, descending and sigmoid  colon were seen well.  Marked diverticular disease of the sigmoid.  No  polyps were seen throughout.  The rectum was free of polyps.  The scope was  withdrawn.  The patient tolerated the procedure well.   ASSESSMENT:  1. No evidence of further colon polyps.   PLAN:  Will recommend yearly hemoccults and repeat colonoscopy in five  years.  Will give her diverticulosis information sheet.                                               James L. Malon Kindle., M.D.    Waldron Session  D:  10/11/2003  T:  10/11/2003  Job:  [id number]   cc:   Gordy Savers, M.D. [facility]

## 2010-11-02 ENCOUNTER — Encounter: Payer: Self-pay | Admitting: Internal Medicine

## 2010-11-02 ENCOUNTER — Ambulatory Visit (INDEPENDENT_AMBULATORY_CARE_PROVIDER_SITE_OTHER): Payer: BC Managed Care – PPO | Admitting: Internal Medicine

## 2010-11-02 DIAGNOSIS — J309 Allergic rhinitis, unspecified: Secondary | ICD-10-CM

## 2010-11-02 DIAGNOSIS — I10 Essential (primary) hypertension: Secondary | ICD-10-CM

## 2010-11-02 NOTE — Progress Notes (Signed)
  Subjective:    Patient ID: Kristy Becker, female    DOB: 1951-04-07, 60 y.o.   MRN: [id number]  HPI  60 year old patient who is seen today with a history of dizziness. This began last night and today is much improved she describes a sense of disequilibrium and mild dizziness with quick change in posture. She does have a history of allergic rhinitis but has not been on her maintenance inhalational medications. She did resume taking Allegra-D last night and has improved. She does have a history of hypertension which has been stable she has been using  saline irrigation  as well as Allegra-D Review of Systems  Constitutional: Positive for fatigue. Negative for fever.  HENT: Positive for congestion and postnasal drip. Negative for hearing loss, sore throat, rhinorrhea, dental problem, sinus pressure and tinnitus.   Eyes: Negative for pain, discharge and visual disturbance.  Respiratory: Negative for cough and shortness of breath.   Cardiovascular: Negative for chest pain, palpitations and leg swelling.  Gastrointestinal: Negative for nausea, vomiting, abdominal pain, diarrhea, constipation, blood in stool and abdominal distention.  Genitourinary: Negative for dysuria, urgency, frequency, hematuria, flank pain, vaginal bleeding, vaginal discharge, difficulty urinating, vaginal pain and pelvic pain.  Musculoskeletal: Negative for joint swelling, arthralgias and gait problem.  Skin: Negative for rash.  Neurological: Negative for dizziness, syncope, speech difficulty, weakness, numbness and headaches.  Hematological: Negative for adenopathy.  Psychiatric/Behavioral: Negative for behavioral problems, dysphoric mood and agitation. The patient is not nervous/anxious.        Objective:   Physical Exam  Constitutional: She is oriented to person, place, and time. She appears well-developed and well-nourished.  HENT:  Head: Normocephalic.  Right Ear: External ear normal.  Left Ear: External ear  normal.  Mouth/Throat: Oropharynx is clear and moist.  Eyes: Conjunctivae and EOM are normal. Pupils are equal, round, and reactive to light.  Neck: Normal range of motion. Neck supple. No thyromegaly present.  Cardiovascular: Normal rate, regular rhythm, normal heart sounds and intact distal pulses.   Pulmonary/Chest: Effort normal and breath sounds normal.  Abdominal: Soft. Bowel sounds are normal. She exhibits no mass. There is no tenderness.  Musculoskeletal: Normal range of motion.  Lymphadenopathy:    She has no cervical adenopathy.  Neurological: She is alert and oriented to person, place, and time. She has normal reflexes. No cranial nerve deficit. Coordination normal.  Skin: Skin is warm and dry. No rash noted.  Psychiatric: She has a normal mood and affect. Her behavior is normal.          Assessment & Plan:   Allergic rhinitis Positional vertigo. Larger resolved Hypertension stable  We'll continue aggressive allergy medication her Astelin and Flonase will be resumed. She will continue on Allegra and samples were provided. She has been asked return in 3 months for followup of her hypertension which is well-controlled today

## 2010-11-02 NOTE — Patient Instructions (Signed)
Limit your sodium (Salt) intake  Please check your blood pressure on a regular basis.  If it is consistently greater than 150/90, please make an office appointment.  Return in 6 months for follow-up   

## 2010-11-27 ENCOUNTER — Ambulatory Visit: Payer: Self-pay | Admitting: Internal Medicine

## 2010-12-15 ENCOUNTER — Other Ambulatory Visit: Payer: Self-pay | Admitting: Obstetrics and Gynecology

## 2011-02-12 ENCOUNTER — Other Ambulatory Visit: Payer: Self-pay | Admitting: Internal Medicine

## 2011-02-26 ENCOUNTER — Other Ambulatory Visit (INDEPENDENT_AMBULATORY_CARE_PROVIDER_SITE_OTHER): Payer: BC Managed Care – PPO

## 2011-02-26 DIAGNOSIS — Z0000 Encounter for general adult medical examination without abnormal findings: Secondary | ICD-10-CM

## 2011-02-26 LAB — CBC WITH DIFFERENTIAL/PLATELET
Basophils Absolute: 0 10*3/uL (ref 0.0–0.1)
Basophils Relative: 0.7 % (ref 0.0–3.0)
Eosinophils Relative: 1.4 % (ref 0.0–5.0)
HCT: 42.8 % (ref 36.0–46.0)
Hemoglobin: 14.2 g/dL (ref 12.0–15.0)
Lymphocytes Relative: 34 % (ref 12.0–46.0)
Lymphs Abs: 1.5 10*3/uL (ref 0.7–4.0)
Monocytes Relative: 8.9 % (ref 3.0–12.0)
Neutro Abs: 2.3 10*3/uL (ref 1.4–7.7)
RBC: 4.69 Mil/uL (ref 3.87–5.11)
RDW: 12.9 % (ref 11.5–14.6)
WBC: 4.3 10*3/uL — ABNORMAL LOW (ref 4.5–10.5)

## 2011-02-26 LAB — POCT URINALYSIS DIPSTICK
Glucose, UA: NEGATIVE
Ketones, UA: NEGATIVE
Leukocytes, UA: NEGATIVE
Protein, UA: NEGATIVE
Urobilinogen, UA: 0.2

## 2011-02-26 LAB — LIPID PANEL
Cholesterol: 175 mg/dL (ref 0–200)
VLDL: 13.4 mg/dL (ref 0.0–40.0)

## 2011-02-26 LAB — BASIC METABOLIC PANEL
Calcium: 9.2 mg/dL (ref 8.4–10.5)
GFR: 130.53 mL/min (ref >60.00)
Glucose, Bld: 99 mg/dL (ref 70–99)
Potassium: 4.7 mEq/L (ref 3.5–5.1)
Sodium: 142 mEq/L (ref 135–145)

## 2011-02-26 LAB — HEPATIC FUNCTION PANEL
ALT: 23 U/L (ref 0–35)
AST: 21 U/L (ref 0–37)
Albumin: 3.9 g/dL (ref 3.5–5.2)
Alkaline Phosphatase: 61 U/L (ref 39–117)

## 2011-02-26 LAB — TSH: TSH: 0.32 u[IU]/mL — ABNORMAL LOW (ref 0.35–5.50)

## 2011-03-05 ENCOUNTER — Encounter: Payer: Self-pay | Admitting: Internal Medicine

## 2011-03-05 ENCOUNTER — Ambulatory Visit (INDEPENDENT_AMBULATORY_CARE_PROVIDER_SITE_OTHER): Payer: BC Managed Care – PPO | Admitting: Internal Medicine

## 2011-03-05 VITALS — BP 122/80 | HR 64 | Temp 98.1°F | Resp 18 | Ht 62.5 in | Wt 173.0 lb

## 2011-03-05 DIAGNOSIS — I10 Essential (primary) hypertension: Secondary | ICD-10-CM

## 2011-03-05 DIAGNOSIS — Z0000 Encounter for general adult medical examination without abnormal findings: Secondary | ICD-10-CM

## 2011-03-05 MED ORDER — AZELASTINE HCL 0.1 % NA SOLN: 1.0000 | Freq: Two times a day (BID) | NASAL | Status: DC

## 2011-03-05 MED ORDER — HYDROCHLOROTHIAZIDE 25 MG PO TABS: 25.0000 mg | ORAL_TABLET | Freq: Every day | ORAL | Status: DC

## 2011-03-05 MED ORDER — LORAZEPAM 0.5 MG PO TABS: 0.5000 mg | ORAL_TABLET | Freq: Two times a day (BID) | ORAL | Status: DC | PRN

## 2011-03-05 MED ORDER — FLUTICASONE PROPIONATE 50 MCG/ACT NA SUSP: 2.0000 | Freq: Every day | NASAL | Status: DC

## 2011-03-05 NOTE — Patient Instructions (Signed)
Limit your sodium (Salt) intake    It is important that you exercise regularly, at least 20 minutes 3 to 4 times per week.  If you develop chest pain or shortness of breath seek  medical attention.  Please check your blood pressure on a regular basis.  If it is consistently greater than 150/90, please make an office appointment.  Return in one year for follow-up  

## 2011-03-05 NOTE — Progress Notes (Signed)
Subjective:    Patient ID: Kristy Becker, female    DOB: 26-Jul-1950, 60 y.o.   MRN: [id number]  HPI   Wt Readings from Last 3 Encounters:  03/05/11 173 lb (78.472 kg)  11/02/10 174 lb (78.926 kg)  05/29/10 175 lb (79.70 kg)   60 year old patient who is seen today for a preventive health examination. She has been seen by gynecology in the spring that included a mammogram and Pap. She is doing quite well. She is exercising regularly and goes to a health club on a regular basis. There's been some modest ongoing weight loss. She feels quite well. Her laboratory studies were reviewed and cholesterol was at her all time low. She has no concerns or complaints today. She has a history of colonic polyps and a strong family history of colon cancer. She is scheduled for followup colonoscopy next year. She has a history of hypertension which is controlled on diuretic therapy. She has allergic rhinitis mild anxiety and occasional back pain her clinical status has been quite stable.  Allergies:  1) ! Penicillin G Potassium (Penicillin G Potassium)  2) ! Biaxin (Clarithromycin)  3) ! Doxycycline Hyclate (Doxycycline Hyclate)  4) ! Cephalosporins  5) ! Carbaphen 12 (Phenyleph-Chlorphen-Carbetapen)   Past History:  Past Medical History:  Reviewed history from 02/26/2009 and no changes required.  Anxiety  Hypertension  Allergic rhinitis  hand eczema  Colonic polyps, hx of   Past Surgical History:  T&A  Colonoscopy ([facility]) 2008  GYN Tenny Craw)   Family History:  Reviewed history from 02/26/2009 and no changes required.  Family History of Colon CA 1st degree relative <60  Family History of Cardiovascular disorder  2 Brothers, one sister with colon CA, Hodgkin's Disease  Father- died colon Ca  5 brothers, one sister, colon ca, Hodgins Disease  Mother-HTN, died CVA   Social History:  Reviewed history from 02/26/2009 and no changes required.  Occupation:  Married  Never Smoked  Regular  exercise-yes   Review of Systems  Constitutional: Negative for fever, appetite change, fatigue and unexpected weight change.  HENT: Negative for hearing loss, ear pain, nosebleeds, congestion, sore throat, mouth sores, trouble swallowing, neck stiffness, dental problem, voice change, sinus pressure and tinnitus.   Eyes: Negative for photophobia, pain, redness and visual disturbance.  Respiratory: Negative for cough, chest tightness and shortness of breath.   Cardiovascular: Negative for chest pain, palpitations and leg swelling.  Gastrointestinal: Negative for nausea, vomiting, abdominal pain, diarrhea, constipation, blood in stool, abdominal distention and rectal pain.  Genitourinary: Negative for dysuria, urgency, frequency, hematuria, flank pain, vaginal bleeding, vaginal discharge, difficulty urinating, genital sores, vaginal pain, menstrual problem and pelvic pain.  Musculoskeletal: Negative for back pain and arthralgias.  Skin: Negative for rash.  Neurological: Negative for dizziness, syncope, speech difficulty, weakness, light-headedness, numbness and headaches.  Hematological: Negative for adenopathy. Does not bruise/bleed easily.  Psychiatric/Behavioral: Negative for suicidal ideas, behavioral problems, self-injury, dysphoric mood and agitation. The patient is not nervous/anxious.        Objective:   Physical Exam  Constitutional: She is oriented to person, place, and time. She appears well-developed and well-nourished.  HENT:  Head: Normocephalic and atraumatic.  Right Ear: External ear normal.  Left Ear: External ear normal.  Mouth/Throat: Oropharynx is clear and moist.  Eyes: Conjunctivae and EOM are normal.  Neck: Normal range of motion. Neck supple. No JVD present. No thyromegaly present.  Cardiovascular: Normal rate, regular rhythm, normal heart sounds and intact distal pulses.  No murmur heard. Pulmonary/Chest: Effort normal and breath sounds normal. She has no  wheezes. She has no rales.  Abdominal: Soft. Bowel sounds are normal. She exhibits no distension and no mass. There is no tenderness. There is no rebound and no guarding.  Musculoskeletal: Normal range of motion. She exhibits no edema and no tenderness.  Neurological: She is alert and oriented to person, place, and time. She has normal reflexes. No cranial nerve deficit. She exhibits normal muscle tone. Coordination normal.  Skin: Skin is warm and dry. No rash noted.  Psychiatric: She has a normal mood and affect. Her behavior is normal.          Assessment & Plan:    Preventive health examination Colonic polyps with family history of colon cancer. Followup colonoscopy next year Hypertension. Well controlled on diuretic therapy Allergic rhinitis stable  Low salt diet encouraged regular exercise will be continued. She is asked to monitor blood pressure readings at home occasionally. Recheck 1 year

## 2011-04-02 ENCOUNTER — Emergency Department (HOSPITAL_COMMUNITY)
Admission: EM | Admit: 2011-04-02 | Discharge: 2011-04-03 | Disposition: A | Discharge status: Home / Self Care | Payer: BC Managed Care – PPO | Attending: Emergency Medicine | Admitting: Emergency Medicine

## 2011-04-02 DIAGNOSIS — T6391XA Toxic effect of contact with unspecified venomous animal, accidental (unintentional), initial encounter: Secondary | ICD-10-CM | POA: Insufficient documentation

## 2011-04-02 DIAGNOSIS — T63391A Toxic effect of venom of other spider, accidental (unintentional), initial encounter: Secondary | ICD-10-CM | POA: Insufficient documentation

## 2011-04-02 DIAGNOSIS — I10 Essential (primary) hypertension: Secondary | ICD-10-CM | POA: Insufficient documentation

## 2011-08-13 ENCOUNTER — Telehealth: Payer: Self-pay | Admitting: *Deleted

## 2011-08-13 ENCOUNTER — Ambulatory Visit (INDEPENDENT_AMBULATORY_CARE_PROVIDER_SITE_OTHER): Payer: BC Managed Care – PPO | Admitting: Internal Medicine

## 2011-08-13 ENCOUNTER — Encounter: Payer: Self-pay | Admitting: Internal Medicine

## 2011-08-13 VITALS — BP 140/82 | HR 68 | Temp 98.3°F | Resp 16 | Wt 173.0 lb

## 2011-08-13 DIAGNOSIS — I10 Essential (primary) hypertension: Secondary | ICD-10-CM

## 2011-08-13 DIAGNOSIS — F411 Generalized anxiety disorder: Secondary | ICD-10-CM

## 2011-08-13 DIAGNOSIS — R002 Palpitations: Secondary | ICD-10-CM

## 2011-08-13 NOTE — Patient Instructions (Signed)
Limit your sodium (Salt) intake    It is important that you exercise regularly, at least 20 minutes 3 to 4 times per week.  If you develop chest pain or shortness of breath seek  medical attention.  Please check your blood pressure on a regular basis.  If it is consistently greater than 150/90, please make an office appointment.  Return in 6 months for follow-up   

## 2011-08-13 NOTE — Telephone Encounter (Signed)
Pt calls stating she has had some fluttering in her chest with anxiety in the past few days.  This happens especially when she is upset.  No chest pain, SOB, sweating or any other suspicious symptoms and knows to go straight to the ER if she does have any of MI symptoms.  Will see Dr. Kirtland Bouchard.

## 2011-08-13 NOTE — Progress Notes (Signed)
  Subjective:    Patient ID: Kristy Becker, female    DOB: 18-Dec-1950, 61 y.o.   MRN: [id number]  HPI  61 year old patient who has a history of anxiety and treated hypertension. For the past 4 or 5 days she has had occasional episodes described as heart fluttering. She really has a difficult time describing this well but does not describe any rapid pulse rate. It sounds like she notes an occasional ectopic beat. There are no associated symptoms ; symptoms are quite brief and fleeting    Review of Systems  Respiratory: Negative for shortness of breath and wheezing.   Cardiovascular: Positive for palpitations. Negative for chest pain and leg swelling.  Neurological: Negative for weakness and light-headedness.       Objective:   Physical Exam  Constitutional: She is oriented to person, place, and time. She appears well-developed and well-nourished.       Blood pressure 124/80  HENT:  Head: Normocephalic.  Right Ear: External ear normal.  Left Ear: External ear normal.  Mouth/Throat: Oropharynx is clear and moist.  Eyes: Conjunctivae and EOM are normal. Pupils are equal, round, and reactive to light.  Neck: Normal range of motion. Neck supple. No thyromegaly present.  Cardiovascular: Normal rate, regular rhythm, normal heart sounds and intact distal pulses.   Pulmonary/Chest: Effort normal and breath sounds normal.       O2 saturation 98% Pulse rate 64  Abdominal: Soft. Bowel sounds are normal. She exhibits no mass. There is no tenderness.  Musculoskeletal: Normal range of motion.  Lymphadenopathy:    She has no cervical adenopathy.  Neurological: She is alert and oriented to person, place, and time.  Skin: Skin is warm and dry. No rash noted.  Psychiatric: She has a normal mood and affect. Her behavior is normal.          Assessment & Plan:   Palpitations. Unremarkable clinical exam and normal 12-lead EKG. Patient was reassured. We'll continue to observe Hypertension well  controlled. We'll continue present regimen  Allergic rhinitis we'll attempt to moderate decongestant use. She has not taken decongestants this week

## 2011-11-04 ENCOUNTER — Encounter: Payer: Self-pay | Admitting: Internal Medicine

## 2011-11-09 ENCOUNTER — Other Ambulatory Visit: Payer: Self-pay | Admitting: Internal Medicine

## 2011-11-19 ENCOUNTER — Encounter (HOSPITAL_COMMUNITY): Payer: Self-pay

## 2011-11-19 ENCOUNTER — Emergency Department (HOSPITAL_COMMUNITY)
Admission: EM | Admit: 2011-11-19 | Discharge: 2011-11-19 | Disposition: A | Discharge status: Home / Self Care | Payer: BC Managed Care – PPO | Attending: Emergency Medicine | Admitting: Emergency Medicine

## 2011-11-19 ENCOUNTER — Emergency Department (HOSPITAL_COMMUNITY): Payer: BC Managed Care – PPO

## 2011-11-19 DIAGNOSIS — R296 Repeated falls: Secondary | ICD-10-CM | POA: Insufficient documentation

## 2011-11-19 DIAGNOSIS — M25519 Pain in unspecified shoulder: Secondary | ICD-10-CM

## 2011-11-19 DIAGNOSIS — F411 Generalized anxiety disorder: Secondary | ICD-10-CM | POA: Insufficient documentation

## 2011-11-19 DIAGNOSIS — I10 Essential (primary) hypertension: Secondary | ICD-10-CM | POA: Insufficient documentation

## 2011-11-19 DIAGNOSIS — R079 Chest pain, unspecified: Secondary | ICD-10-CM | POA: Insufficient documentation

## 2011-11-19 MED ORDER — OXYCODONE-ACETAMINOPHEN 5-325 MG PO TABS: 1.0000 | ORAL_TABLET | ORAL | Status: AC | PRN

## 2011-11-19 MED ORDER — OXYCODONE-ACETAMINOPHEN 5-325 MG PO TABS
1.0000 | ORAL_TABLET | Freq: Once | ORAL | Status: AC
  Administered 2011-11-19: 1 via ORAL
  Filled 2011-11-19: qty 1

## 2011-11-19 NOTE — ED Provider Notes (Signed)
History   This chart was scribed for Celene Kras, MD by Shari Heritage. The patient was seen in room STRE5/STRE5. Patient's care was started at 1311.     CSN: [id number]  Arrival date & time 11/19/11  1311   First MD Initiated Contact with Patient 11/19/11 1450      Chief Complaint  Patient presents with  . Shoulder Injury    (Consider location/radiation/quality/duration/timing/severity/associated sxs/prior treatment) The history is provided by the patient. No language interpreter was used.   Kristy Becker is a 61 y.o. female who presents to the Emergency Department complaining of a fall with associated right upper arm and shoulder. Patient fell on her right shoulder and arm while trying to escape a dog. Patient has difficulty moving her arm, shoulder and hand without feeling pain. Patient with h/o of anxiety, back pain, colonic polyps and HTN. Patient with surgical h/o of tonsillectomy and adenoidectomy. Patient has never smoked.   Past Medical History  Diagnosis Date  . ALLERGIC RHINITIS 10/06/2007  . ANXIETY 01/31/2007  . BACK PAIN 08/21/2007  . COLONIC POLYPS, HX OF 02/26/2009  . ECZEMA, HANDS 05/08/2007  . HYPERTENSION 01/31/2007    Past Surgical History  Procedure Date  . Tonsillectomy and adenoidectomy     No family history on file.  History  Substance Use Topics  . Smoking status: Never Smoker   . Smokeless tobacco: Never Used  . Alcohol Use: No    OB History    Grav Para Term Preterm Abortions TAB SAB Ect Mult Living                  Review of Systems A complete 10 system review of systems was obtained and all systems are negative except as noted in the HPI and PMH.   Allergies  Cephalosporins; Clarithromycin; Doxycycline hyclate; and Penicillins  Home Medications   Current Outpatient Rx  Name Route Sig Dispense Refill  . AZELASTINE HCL 137 MCG/SPRAY NA SOLN Nasal Place 1 spray into the nose 2 (two) times daily. Use in each nostril as directed    .  CALCIUM CARBONATE ANTACID 500 MG PO CHEW Oral Chew 1 tablet by mouth daily as needed. For indigestion.    Marland Kitchen FEXOFENADINE-PSEUDOEPHED ER 60-120 MG PO TB12 Oral Take 1 tablet by mouth 2 (two) times daily.     Marland Kitchen FLUTICASONE PROPIONATE 50 MCG/ACT NA SUSP Nasal Place 2 sprays into the nose daily as needed. For congestion.    Marland Kitchen HYDROCHLOROTHIAZIDE 25 MG PO TABS Oral Take 25 mg by mouth daily.    Marland Kitchen LORAZEPAM 0.5 MG PO TABS Oral Take 0.5 mg by mouth 2 (two) times daily as needed. For anxiety.    . ADULT MULTIVITAMIN W/MINERALS CH Oral Take 1 tablet by mouth daily.    . NON FORMULARY Injection Inject 1-2 each as directed once a week. Weekly allergy injections.  Last injection 11/16/2011.      BP 141/71  Pulse 64  Temp(Src) 98.1 F (36.7 C) (Oral)  Resp 16  SpO2 99%  Physical Exam  Nursing note and vitals reviewed. Constitutional: She appears well-developed and well-nourished. No distress.  HENT:  Head: Normocephalic and atraumatic.  Right Ear: External ear normal.  Left Ear: External ear normal.  Eyes: Conjunctivae are normal. Right eye exhibits no discharge. Left eye exhibits no discharge. No scleral icterus.  Neck: Neck supple. No tracheal deviation present.  Cardiovascular: Normal rate.   Pulmonary/Chest: Effort normal. No stridor. No respiratory distress.  Musculoskeletal: She  exhibits tenderness. She exhibits no edema.       Right shoulder: She exhibits decreased range of motion, tenderness (Proximal mid-humerus) and bony tenderness. She exhibits no swelling, no effusion, no deformity and normal pulse.       Right elbow: Normal.She exhibits normal range of motion, no swelling, no effusion and no deformity.       Right wrist: Normal. She exhibits normal range of motion, no tenderness, no bony tenderness and no swelling.       Right upper arm: She exhibits tenderness and bony tenderness. She exhibits no swelling.  Neurological: She is alert. Cranial nerve deficit: no gross deficits.  Skin:  Skin is warm and dry. No rash noted.  Psychiatric: She has a normal mood and affect.    ED Course  Procedures (including critical care time) DIAGNOSTIC STUDIES: Oxygen Saturation is 99% on room air, normal by my interpretation.    COORDINATION OF CARE: 3:04PM - Patient informed of current plan for treatment and evaluation and agrees with plan at this time. X-ray of shoulder shows no fracture. Will administer pain medication and an immobilizer in ED.   Labs Reviewed - No data to display Dg Shoulder Right  11/19/2011  *RADIOLOGY REPORT*  Clinical Data: Fall, right shoulder pain  RIGHT SHOULDER - 2+ VIEW  Comparison: None.  Findings: Three views of the right shoulder submitted.  No acute fracture or subluxation.  Mild degenerative changes right acromioclavicular joint.  IMPRESSION: No acute fracture or subluxation.  Mild generative changes right acromioclavicular joint.  Original Report Authenticated By: Natasha Mead, M.D.   Dg Humerus Right  11/19/2011  *RADIOLOGY REPORT*  Clinical Data: Fall.  Pain.  RIGHT HUMERUS - 2+ VIEW  Comparison: Right shoulder films same date.  Findings: Acromioclavicular joint degenerative changes.  No right humerus fracture.  If elbow injury is of concern, cone down views including direct lateral view recommended.  IMPRESSION: No fracture.  Please see above.  Original Report Authenticated By: Fuller Canada, M.D.     1. AC joint pain       MDM  Patient had a mechanical fall and complains of pain primarily in her right upper chest on the in the mid to proximal humerus. X-rays of her shoulder and humerus do not show any signs of any fracture or dislocation. I suspect she has had an a.c. joint sprain. Patient has been given a sling and no discharge her home with a prescription for medications for pain. Recommended followup with her primary Dr. or consider seeing an orthopedic doctor if she does not improve in the next week or      I personally performed the  services described in this documentation, which was scribed in my presence.  The recorded information has been reviewed and considered.    Celene Kras, MD 11/19/11 [phone]

## 2011-11-19 NOTE — ED Notes (Signed)
Pt. Kristy Becker due to a dog coming at her.  Pt. Fell onto her rt. Shoulder and rt. Upper arm.  She is unable to mover her rt. Arm up. No deformity noted or rt. Upper is arm is slightly swollen, + radial pulse,  +CNS

## 2011-11-19 NOTE — Discharge Instructions (Signed)
Acromioclavicular Injuries  The AC (acromioclavicular) joint is the joint in the shoulder where the collarbone (clavicle) meets the shoulder blade (scapula). The part of the shoulder blade connected to the collarbone is called the acromion. Common problems with and treatments for the AC joint are detailed below.  ARTHRITIS  Arthritis occurs when the joint has been injured and the smooth padding between the joints (cartilage) is lost. This is the wear and tear seen in most joints of the body if they have been overused. This causes the joint to produce pain and swelling which is worse with activity.   AC JOINT SEPARATION  AC joint separation means that the ligaments connecting the acromion of the shoulder blade and collarbone have been damaged, and the two bones no longer line up. AC separations can be anywhere from mild to severe, and are "graded" depending upon which ligaments are torn and how badly they are torn.   Grade I Injury: the least damage is done, and the AC joint still lines up.   Grade II Injury: damage to the ligaments which reinforce the AC joint. In a Grade II injury, these ligaments are stretched but not entirely torn. When stressed, the AC joint becomes painful and unstable.   Grade III Injury: AC and secondary ligaments are completely torn, and the collarbone is no longer attached to the shoulder blade. This results in deformity; a prominence of the end of the clavicle.  AC JOINT FRACTURE  AC joint fracture means that there has been a break in the bones of the AC joint, usually the end of the clavicle.  TREATMENT  TREATMENT OF AC ARTHRITIS   There is currently no way to replace the cartilage damaged by arthritis. The best way to improve the condition is to decrease the activities which aggravate the problem. Application of ice to the joint helps decrease pain and soreness (inflammation). The use of non-steroidal anti-inflammatory medication is helpful.   If less conservative measures do not  work, then cortisone shots (injections) may be used. These are anti-inflammatories; they decrease the soreness in the joint and swelling.   If non-surgical measures fail, surgery may be recommended. The procedure is generally removal of a portion of the end of the clavicle. This is the part of the collarbone closest to your acromion which is stabilized with ligaments to the acromion of the shoulder blade. This surgery may be performed using a tube-like instrument with a light (arthroscope) for looking into a joint. It may also be performed as an open surgery through a small incision by the surgeon. Most patients will have good range of motion within 6 weeks and may return to all activity including sports by 8-12 weeks, barring complications.  TREATMENT OF AN AC SEPARATION   The initial treatment is to decrease pain. This is best accomplished by immobilizing the arm in a sling and placing an ice pack to the shoulder for 20 to 30 minutes every 2 hours as needed. As the pain starts to subside, it is important to begin moving the fingers, wrist, elbow and eventually the shoulder in order to prevent a stiff or "frozen" shoulder. Instruction on when and how much to move the shoulder will be provided by your caregiver. The length of time needed to regain full motion and function depends on the amount or grade of the injury. Recovery from a Grade I AC separation usually takes 10 to 14 days, whereas a Grade III may take 6 to 8 weeks.   Grade   I and II separations usually do not require surgery. Even Grade III injuries usually allow return to full activity with few restrictions. Treatment is also based on the activity demands of the injured shoulder. For example, a high level quarterback with an injured throwing arm will receive more aggressive treatment than someone with a desk job who rarely uses his/her arm for strenuous activities. In some cases, a painful lump may persist which could require a later surgery. Surgery  can be very successful, but the benefits must be weighed against the potential risks.  TREATMENT OF AN AC JOINT FRACTURE  Fracture treatment depends on the type of fracture. Sometimes a splint or sling may be all that is required. Other times surgery may be required for repair. This is more frequently the case when the ligaments supporting the clavicle are completely torn. Your caregiver will help you with these decisions and together you can decide what will be the best treatment.  HOME CARE INSTRUCTIONS    Apply ice to the injury for 15 to 20 minutes each hour while awake for 2 days. Put the ice in a plastic bag and place a towel between the bag of ice and skin.   If a sling has been applied, wear it constantly for as long as directed by your caregiver, even at night. The sling or splint can be removed for bathing or showering or as directed. Be sure to keep the shoulder in the same place as when the sling is on. Do not lift the arm.   If a figure-of-eight splint has been applied it should be tightened gently by another person every day. Tighten it enough to keep the shoulders held back. Allow enough room to place the index finger between the body and strap. Loosen the splint immediately if there is numbness or tingling in the hands.   Take over-the-counter or prescription medicines for pain, discomfort or fever as directed by your caregiver.   If you or your child has received a follow up appointment, it is very important to keep that appointment in order to avoid long term complications, chronic pain or disability.  SEEK MEDICAL CARE IF:    The pain is not relieved with medications.   There is increased swelling or discoloration that continues to get worse rather than better.   You or your child has been unable to follow up as instructed.   There is progressive numbness and tingling in the arm, forearm or hand.  SEEK IMMEDIATE MEDICAL CARE IF:    The arm is numb, cold or pale.   There is increasing  pain in the hand, forearm or fingers.  MAKE SURE YOU:    Understand these instructions.   Will watch your condition.   Will get help right away if you are not doing well or get worse.  Document Released: 03/10/2005 Document Revised: 05/20/2011 Document Reviewed: 09/02/2008  [employer] Patient Information 2012 [employer].

## 2011-11-19 NOTE — Progress Notes (Signed)
Orthopedic Tech Progress Note Patient Details:  Kristy Becker [birth year] [id number]  Ortho Devices Type of Ortho Device: Sling immobilizer Ortho Device/Splint Location: (R) UE Ortho Device/Splint Interventions: Application   Jennye Moccasin 11/19/2011, 3:31 PM

## 2011-12-20 ENCOUNTER — Encounter: Payer: Self-pay | Admitting: Internal Medicine

## 2011-12-20 ENCOUNTER — Ambulatory Visit (INDEPENDENT_AMBULATORY_CARE_PROVIDER_SITE_OTHER): Payer: BC Managed Care – PPO | Admitting: Internal Medicine

## 2011-12-20 VITALS — BP 130/70 | Temp 98.0°F | Wt 171.0 lb

## 2011-12-20 DIAGNOSIS — R109 Unspecified abdominal pain: Secondary | ICD-10-CM

## 2011-12-20 DIAGNOSIS — I10 Essential (primary) hypertension: Secondary | ICD-10-CM

## 2011-12-20 NOTE — Patient Instructions (Addendum)
Limit your sodium (Salt) intake  Please check your blood pressure on a regular basis.  If it is consistently greater than 150/90, please make an office appointment.  Return in 3 months for follow-up  

## 2011-12-20 NOTE — Progress Notes (Signed)
  Subjective:    Patient ID: Kristy Becker, female    DOB: 1951/02/01, 62 y.o.   MRN: [id number]  HPI  61 year old patient who is seen today with a chief complaint of mild stomach upset. She does have mild abdominal discomfort excess gaseousness and episodes of diarrhea. Symptoms began shortly after visiting in a restaurant in [place] and consuming rich foods. The diarrhea has resolved. At the present time she complains of mild gaseousness only. No nausea or vomiting . She does have treated hypertension which has been stable. She has a history of allergic rhinitis. She has anxiety disorder   Review of Systems  Constitutional: Negative.   HENT: Negative for hearing loss, congestion, sore throat, rhinorrhea, dental problem, sinus pressure and tinnitus.   Eyes: Negative for pain, discharge and visual disturbance.  Respiratory: Negative for cough and shortness of breath.   Cardiovascular: Negative for chest pain, palpitations and leg swelling.  Gastrointestinal: Negative for nausea, vomiting, abdominal pain, diarrhea, constipation, blood in stool and abdominal distention.  Genitourinary: Negative for dysuria, urgency, frequency, hematuria, flank pain, vaginal bleeding, vaginal discharge, difficulty urinating, vaginal pain and pelvic pain.  Musculoskeletal: Negative for joint swelling, arthralgias and gait problem.  Skin: Negative for rash.  Neurological: Negative for dizziness, syncope, speech difficulty, weakness, numbness and headaches.  Hematological: Negative for adenopathy.  Psychiatric/Behavioral: Negative for behavioral problems, dysphoric mood and agitation. The patient is not nervous/anxious.        Objective:   Physical Exam  Constitutional: She is oriented to person, place, and time. She appears well-developed and well-nourished.  HENT:  Head: Normocephalic.  Right Ear: External ear normal.  Left Ear: External ear normal.  Mouth/Throat: Oropharynx is clear and moist.  Eyes:  Conjunctivae and EOM are normal. Pupils are equal, round, and reactive to light.  Neck: Normal range of motion. Neck supple. No thyromegaly present.  Cardiovascular: Normal rate, regular rhythm, normal heart sounds and intact distal pulses.   Pulmonary/Chest: Effort normal and breath sounds normal.  Abdominal: Soft. Bowel sounds are normal. She exhibits no distension and no mass. There is no tenderness. There is no rebound and no guarding.  Musculoskeletal: Normal range of motion.  Lymphadenopathy:    She has no cervical adenopathy.  Neurological: She is alert and oriented to person, place, and time.  Skin: Skin is warm and dry. No rash noted.  Psychiatric: She has a normal mood and affect. Her behavior is normal.          Assessment & Plan:   Abdominal Pain-largely resolved  Will observe; prob food intolerance HTN- stable

## 2012-01-18 ENCOUNTER — Ambulatory Visit (INDEPENDENT_AMBULATORY_CARE_PROVIDER_SITE_OTHER): Payer: BC Managed Care – PPO | Admitting: Internal Medicine

## 2012-01-18 ENCOUNTER — Encounter: Payer: Self-pay | Admitting: Internal Medicine

## 2012-01-18 VITALS — BP 130/80 | Temp 98.7°F | Wt 169.0 lb

## 2012-01-18 DIAGNOSIS — I10 Essential (primary) hypertension: Secondary | ICD-10-CM

## 2012-01-18 DIAGNOSIS — K219 Gastro-esophageal reflux disease without esophagitis: Secondary | ICD-10-CM

## 2012-01-18 NOTE — Patient Instructions (Signed)
Diet for GERD or PUD Nutrition therapy can help ease the discomfort of gastroesophageal reflux disease (GERD) and peptic ulcer disease (PUD).   HOME CARE INSTRUCTIONS    Eat your meals slowly, in a relaxed setting.   Eat 5 to 6 small meals per day.   If a food causes distress, stop eating it for a period of time.  FOODS TO AVOID  Coffee, regular or decaffeinated.   Cola beverages, regular or low calorie.   Tea, regular or decaffeinated.   Pepper.   Cocoa.   High fat foods, including meats.   Butter, margarine, hydrogenated oil (trans fats).   Peppermint or spearmint (if you have GERD).   Fruits and vegetables if not tolerated.   Alcohol.   Nicotine (smoking or chewing). This is one of the most potent stimulants to acid production in the gastrointestinal tract.   Any food that seems to aggravate your condition.  If you have questions regarding your diet, ask your caregiver or a registered dietitian. TIPS  Lying flat may make symptoms worse. Keep the head of your bed raised 6 to 9 inches (15 to 23 cm) by using a foam wedge or blocks under the legs of the bed.   Do not lay down until 3 hours after eating a meal.   Daily physical activity may help reduce symptoms.  MAKE SURE YOU:    Understand these instructions.   Will watch your condition.   Will get help right away if you are not doing well or get worse.  Document Released: 05/31/2005 Document Revised: 05/20/2011 Document Reviewed: 04/16/2011 [facility] Patient Information 2012 [place], [place].Gastroesophageal Reflux Disease, Adult Gastroesophageal reflux disease (GERD) happens when acid from your stomach goes into your food pipe (esophagus). The acid can cause a burning feeling in your chest. Over time, the acid can make small holes (ulcers) in your food pipe.   HOME CARE  Ask your doctor for advice about:   Losing weight.   Quitting smoking.   Alcohol use.   Avoid foods and drinks that make your problems  worse. You may want to avoid:   Caffeine and alcohol.   Chocolate.   Mints.   Garlic and onions.   Spicy foods.   Citrus fruits, such as oranges, lemons, or limes.   Foods that contain tomato, such as sauce, chili, salsa, and pizza.   Fried and fatty foods.   Avoid lying down for 3 hours before you go to bed or before you take a nap.   Eat small meals often, instead of large meals.   Wear loose-fitting clothing. Do not wear anything tight around your waist.   Raise (elevate) the head of your bed 6 to 8 inches with wood blocks. Using extra pillows does not help.   Only take medicines as told by your doctor.   Do not take aspirin or ibuprofen.  GET HELP RIGHT AWAY IF:    You have pain in your arms, neck, jaw, teeth, or back.   Your pain gets worse or changes.   You feel sick to your stomach (nauseous), throw up (vomit), or sweat (diaphoresis).   You feel short of breath, or you pass out (faint).   Your throw up is green, yellow, black, or looks like coffee grounds or blood.   Your poop (stool) is red, bloody, or black.  MAKE SURE YOU:    Understand these instructions.   Will watch your condition.   Will get help right away if you are not  doing well or get worse.  Document Released: 11/17/2007 Document Revised: 05/20/2011 Document Reviewed: 12/18/2010 [facility] Patient Information 2012 [place], [place].

## 2012-01-18 NOTE — Progress Notes (Signed)
  Subjective:    Patient ID: Kristy Becker, female    DOB: Jan 14, 1951, 61 y.o.   MRN: [id number]  HPI  61 year old patient who has a history of hypertension and allergic rhinitis. She has some mild intermittent reflux symptoms;  yesterday had some burning substernal discomfort that lasted intermittently throughout the day. Symptoms were relieved by belching and indigestion of carbonated beverages. No nausea or vomiting. No dysphagia    Review of Systems  Gastrointestinal: Negative.        Objective:   Physical Exam  Constitutional: She is oriented to person, place, and time. She appears well-developed and well-nourished.  HENT:  Head: Normocephalic.  Right Ear: External ear normal.  Left Ear: External ear normal.  Mouth/Throat: Oropharynx is clear and moist.  Eyes: Conjunctivae and EOM are normal. Pupils are equal, round, and reactive to light.  Neck: Normal range of motion. Neck supple. No thyromegaly present.  Cardiovascular: Normal rate, regular rhythm, normal heart sounds and intact distal pulses.   Pulmonary/Chest: Effort normal and breath sounds normal. No respiratory distress. She has no wheezes.  Abdominal: Soft. Bowel sounds are normal. She exhibits no distension and no mass. There is no tenderness.  Musculoskeletal: Normal range of motion.  Lymphadenopathy:    She has no cervical adenopathy.  Neurological: She is alert and oriented to person, place, and time.  Skin: Skin is warm and dry. No rash noted.  Psychiatric: She has a normal mood and affect. Her behavior is normal.          Assessment & Plan:     Mild reflux esophagitis.  Will give information concerning an antireflux regimen and treat with short-term PPI  Hypertension stable

## 2012-01-21 ENCOUNTER — Telehealth: Payer: Self-pay | Admitting: Internal Medicine

## 2012-01-21 NOTE — Telephone Encounter (Signed)
[name]: Kristy Becker/Patient; PCP: Eleonore Chiquito; CB#: [phone]; Call regarding Feeling Air in Throat and Given Med for GERD- Samples of Cegerid 40/110 Mgs to be taken QD - Not Sure When To Take Med. She Is Taking With [employer]; She has taken it for the last 3 days. Last night she noticed some puffiness on bottom of L foot and funny feeling under several toes of L foot. Today foot back to normal. She is wondering if she needs to stop med. Instructions and side effects of med given per WebMD - Advised to take med 1 hour before meal on empty stomach with full glass of water. Advised not to take any Calcium products with med. She was taking TUMS. Advised to stop med if sx return  and call. She may try taking it every other day with TUMS on alternate days. She may want to switch to OTC Prilosec if sx return. Medication Questions Protocol. Please call if Dr. Lesia Hausen has any other suggestions/concerns.

## 2012-01-25 ENCOUNTER — Telehealth: Payer: Self-pay | Admitting: Internal Medicine

## 2012-01-25 NOTE — Telephone Encounter (Signed)
[name]: [name]/Patient; Patient Name: Kristy Becker; PCP: Eleonore Chiquito; Best Callback Phone Number: [phone]     Started Zegerid on 8/6 after Office visit for symptoms of air feeling trapped in throat.  After starting the new medication had swelling on bottom of foot on 8/8.  Called and talked with nurse and decided not to take the medication every morning.  Called pharmacist on Sat 8/10 and she suggested a change to Prilosec.  Was okay after her first dose.  Then yesterday felt woozy, dizziness about an hour after the dose.  Dizziness lasting for 15 minutes.    Is feeling better since changing from Zegerid to Prilosec.   Now concern that the dizziness might have been a side effect of the Prilosec.  Is looking for a medicine  that works without dizziness.  Asking if she can break the tablet in half for daily dose or she should get another medicine.  Did not take dose this am    No problems today with air feeling trapped or burning in throat.  Triaged in Dizziness Guideline - Disposition:  See Provider Within 24 Hours due to symptoms beginning after starting new medicine.  Has been changing eating habits.  Declined appointment with anyone other than Dr Amador Cunas.  PLEASE REVIEW AND CONTACT PATIENT ABOUT INSTRUCTIONS CONCERNING HER MEDICATION.  CAn reach patient at  [phone].

## 2012-01-25 NOTE — Telephone Encounter (Signed)
Spoke with pt - discussed GERD and prilosec , having some dizziness - cont really target prilosec as cause - pt will take qod with morning meal thru the wkend to see if sx return and will report back to me.

## 2012-02-09 ENCOUNTER — Telehealth: Payer: Self-pay | Admitting: Internal Medicine

## 2012-02-09 NOTE — Telephone Encounter (Signed)
[name]: Kristy Becker/Patient; Patient Name: Kristy Becker; PCP: Eleonore Chiquito [facility]); Best Callback Phone Number: [phone] Kaylynn started taking Prilosec approximately 3 weeks ago for reflux.  Developed throat tightness prior to taking Prilosec.  Continuing to have episodes of throat tightness, intermittently.  At this moment, is not having throat tightness.  Denies difficulty swallowing or breathing.  Ate grapes last night and developed hives.  Hives are gone today.  Chastin states she is anxious and worried.  Wanting to be seen by Dr. Amador Cunas.  Utilized Allergic Reaction Guideline.  See Provider within 24 hours disposition due to "persistent or recurrent symptoms that do not respond to treatment".  Scheduled a 30 minute appointment with Dr. Amador Cunas at 1100 on 02/10/12.  Parameters reviewed concerning when to call back.

## 2012-02-10 ENCOUNTER — Ambulatory Visit (INDEPENDENT_AMBULATORY_CARE_PROVIDER_SITE_OTHER): Payer: BC Managed Care – PPO | Admitting: Internal Medicine

## 2012-02-10 ENCOUNTER — Encounter: Payer: Self-pay | Admitting: Internal Medicine

## 2012-02-10 VITALS — BP 128/80

## 2012-02-10 DIAGNOSIS — I10 Essential (primary) hypertension: Secondary | ICD-10-CM

## 2012-02-10 DIAGNOSIS — F411 Generalized anxiety disorder: Secondary | ICD-10-CM

## 2012-02-10 DIAGNOSIS — J309 Allergic rhinitis, unspecified: Secondary | ICD-10-CM

## 2012-02-10 MED ORDER — HYDROXYZINE HCL 25 MG PO TABS: 25.0000 mg | ORAL_TABLET | Freq: Three times a day (TID) | ORAL | Status: AC | PRN

## 2012-02-10 MED ORDER — FAMOTIDINE 20 MG PO CHEW: CHEWABLE_TABLET | ORAL | Status: DC

## 2012-02-10 NOTE — Telephone Encounter (Signed)
appt noted.

## 2012-02-10 NOTE — Progress Notes (Signed)
  Subjective:    Patient ID: Kristy Becker, female    DOB: 1950/08/03, 61 y.o.   MRN: [id number]  HPI  Wt Readings from Last 3 Encounters:  01/18/12 169 lb (76.658 kg)  12/20/11 171 lb (77.565 kg)  08/13/11 173 lb (78.472 kg)    Review of Systems     Objective:   Physical Exam        Assessment & Plan:

## 2012-02-10 NOTE — Patient Instructions (Signed)
Followup allergist  Take new medications as directed  Call or return to clinic prn if these symptoms worsen or fail to improve as anticipated.

## 2012-02-10 NOTE — Progress Notes (Signed)
Subjective:    Patient ID: Kristy Becker, female    DOB: [birth year], 61 y.o.   MRN: [id number]  HPI  61 year old patient who is seen today for followup. She has a history of allergic rhinitis anxiety disorder and treated hypertension. She has not been seen by allergy and is being evaluated for hives. Last visit she was felt to have some reflux symptoms and was placed on omeprazole. She states that she does not tolerate this well and has only taken one half tablet daily. Still having some mild the throat tightness.  Past Medical History  Diagnosis Date  . ALLERGIC RHINITIS 10/06/2007  . ANXIETY 01/31/2007  . BACK PAIN 08/21/2007  . COLONIC POLYPS, HX OF 02/26/2009  . ECZEMA, HANDS 05/08/2007  . HYPERTENSION 01/31/2007    History   Social History  . Marital Status: Married    Spouse Name: N/A    Number of Children: N/A  . Years of Education: N/A   Occupational History  . Not on file.   Social History Main Topics  . Smoking status: Never Smoker   . Smokeless tobacco: Never Used  . Alcohol Use: No  . Drug Use: No  . Sexually Active: Not on file   Other Topics Concern  . Not on file   Social History Narrative  . No narrative on file    Past Surgical History  Procedure Date  . Tonsillectomy and adenoidectomy     No family history on file.  Allergies  Allergen Reactions  . Cephalosporins Other (See Comments)    unknown  . Clarithromycin Other (See Comments)    unknown  . Doxycycline Hyclate Other (See Comments)  . Penicillins Hives    Current Outpatient Prescriptions on File Prior to Visit  Medication Sig Dispense Refill  . azelastine (ASTELIN) 137 MCG/SPRAY nasal spray Place 1 spray into the nose 2 (two) times daily. Use in each nostril as directed      . calcium carbonate (TUMS - DOSED IN MG ELEMENTAL CALCIUM) 500 MG chewable tablet Chew 1 tablet by mouth daily as needed. For indigestion.      . fexofenadine-pseudoephedrine (ALLEGRA-D) 60-120 MG per tablet Take 1  tablet by mouth 2 (two) times daily.       . fluticasone (FLONASE) 50 MCG/ACT nasal spray Place 2 sprays into the nose daily as needed. For congestion.      . hydrochlorothiazide (HYDRODIURIL) 25 MG tablet Take 25 mg by mouth daily.      Marland Kitchen LORazepam (ATIVAN) 0.5 MG tablet Take 0.5 mg by mouth 2 (two) times daily as needed. For anxiety.      . Multiple Vitamin (MULTIVITAMIN WITH MINERALS) TABS Take 1 tablet by mouth daily.      . NON FORMULARY Inject 1-2 each as directed once a week. Weekly allergy injections.  Last injection 11/16/2011.      . Famotidine 20 MG CHEW 1 twice daily  60 each  3    BP 128/80     .    Review of Systems  Constitutional: Negative.   HENT: Negative for hearing loss, congestion, sore throat, rhinorrhea, dental problem, sinus pressure and tinnitus.   Eyes: Negative for pain, discharge and visual disturbance.  Respiratory: Negative for cough and shortness of breath.   Cardiovascular: Negative for chest pain, palpitations and leg swelling.  Gastrointestinal: Negative for nausea, vomiting, abdominal pain, diarrhea, constipation, blood in stool and abdominal distention.  Genitourinary: Negative for dysuria, urgency, frequency, hematuria, flank pain, vaginal bleeding, vaginal  discharge, difficulty urinating, vaginal pain and pelvic pain.  Musculoskeletal: Negative for joint swelling, arthralgias and gait problem.  Skin: Positive for rash.  Neurological: Negative for dizziness, syncope, speech difficulty, weakness, numbness and headaches.  Hematological: Negative for adenopathy.  Psychiatric/Behavioral: Negative for behavioral problems, dysphoric mood and agitation. The patient is not nervous/anxious.        Objective:   Physical Exam  Constitutional: She is oriented to person, place, and time. She appears well-developed and well-nourished.        Anxious no distress Blood pressure 120/82  HENT:  Head: Normocephalic.  Right Ear: External ear normal.  Left  Ear: External ear normal.  Mouth/Throat: Oropharynx is clear and moist.       Oropharynx benign  Eyes: Conjunctivae and EOM are normal. Pupils are equal, round, and reactive to light.  Neck: Normal range of motion. Neck supple. No thyromegaly present.  Cardiovascular: Normal rate, regular rhythm, normal heart sounds and intact distal pulses.   Pulmonary/Chest: Effort normal and breath sounds normal. She has no wheezes.  Abdominal: Soft. Bowel sounds are normal. She exhibits no mass. There is no tenderness.  Musculoskeletal: Normal range of motion.  Lymphadenopathy:    She has no cervical adenopathy.  Neurological: She is alert and oriented to person, place, and time.  Skin: Skin is warm and dry. No rash noted.  Psychiatric: She has a normal mood and affect. Her behavior is normal.          Assessment & Plan:  Hypertension Recent history of hives. We'll discontinue omeprazole which the patient does not feel she tolerates well and substitute Pepcid. We'll also place on hydroxyzine both for the anti-allergy and anti-anxiety fracture Followup allergy Return here in 3 months or as needed We'll continue anti-reflex diet

## 2012-02-11 ENCOUNTER — Telehealth: Payer: Self-pay | Admitting: Internal Medicine

## 2012-02-11 NOTE — Telephone Encounter (Signed)
[name]: Tiarna/Patient; Patient Name: Kristy Becker; PCP: Eleonore Chiquito [facility]); Best Callback Phone Number: [phone]; Call regarding Sinus Pressure and not "feeling herself" , onset 8-28. Patient was given Famotidine for Reflux and Hives by Dr Davy Pique on 8-29.  Patient wanting to know if she can take Allegra allergy tab with Famotidine.  Per Patient Reflux and Hives are improving since Famotidine. Afebrile.  Per [employer], no contraindications between Allegra and Famotidine. All emergent symptoms ruled out per Allergies Protocol, see in 72 huors.  Advised Patient to call back if symptoms worsen.

## 2012-02-29 ENCOUNTER — Telehealth: Payer: Self-pay | Admitting: Internal Medicine

## 2012-02-29 NOTE — Telephone Encounter (Signed)
Spoke with pt- informed of dr. kwiatkowski's instructions 

## 2012-02-29 NOTE — Telephone Encounter (Signed)
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 Please advise.

## 2012-02-29 NOTE — Telephone Encounter (Signed)
I do not believe that the flu shot should be a factor. I would also encourage the patient to have a flu vaccine

## 2012-02-29 NOTE — Telephone Encounter (Signed)
Please advise 

## 2012-03-01 ENCOUNTER — Other Ambulatory Visit: Payer: BC Managed Care – PPO

## 2012-03-03 ENCOUNTER — Other Ambulatory Visit: Payer: BC Managed Care – PPO

## 2012-03-06 ENCOUNTER — Encounter: Payer: BC Managed Care – PPO | Admitting: Internal Medicine

## 2012-03-10 ENCOUNTER — Encounter: Payer: BC Managed Care – PPO | Admitting: Internal Medicine

## 2012-03-13 ENCOUNTER — Encounter: Payer: BC Managed Care – PPO | Admitting: Internal Medicine

## 2012-03-29 ENCOUNTER — Other Ambulatory Visit: Payer: Self-pay | Admitting: Internal Medicine

## 2012-03-29 ENCOUNTER — Other Ambulatory Visit (INDEPENDENT_AMBULATORY_CARE_PROVIDER_SITE_OTHER): Payer: BC Managed Care – PPO

## 2012-03-29 DIAGNOSIS — Z0000 Encounter for general adult medical examination without abnormal findings: Secondary | ICD-10-CM

## 2012-03-29 LAB — HEPATIC FUNCTION PANEL
Albumin: 3.6 g/dL (ref 3.5–5.2)
Alkaline Phosphatase: 62 U/L (ref 39–117)
Total Protein: 7.4 g/dL (ref 6.0–8.3)

## 2012-03-29 LAB — POCT URINALYSIS DIPSTICK
Protein, UA: NEGATIVE
Spec Grav, UA: 1.02
Urobilinogen, UA: 0.2

## 2012-03-29 LAB — LIPID PANEL
Cholesterol: 168 mg/dL (ref 0–200)
LDL Cholesterol: 105 mg/dL — ABNORMAL HIGH (ref 0–99)

## 2012-03-29 LAB — BASIC METABOLIC PANEL
CO2: 30 mEq/L (ref 19–32)
Chloride: 104 mEq/L (ref 96–112)
Sodium: 141 mEq/L (ref 135–145)

## 2012-03-29 LAB — CBC WITH DIFFERENTIAL/PLATELET
Basophils Relative: 0.6 % (ref 0.0–3.0)
Eosinophils Absolute: 0.1 10*3/uL (ref 0.0–0.7)
HCT: 43.4 % (ref 36.0–46.0)
Hemoglobin: 14.4 g/dL (ref 12.0–15.0)
Lymphs Abs: 1.5 10*3/uL (ref 0.7–4.0)
MCHC: 33.2 g/dL (ref 30.0–36.0)
MCV: 89.8 fl (ref 78.0–100.0)
Monocytes Absolute: 0.4 10*3/uL (ref 0.1–1.0)
Neutro Abs: 2.4 10*3/uL (ref 1.4–7.7)
RBC: 4.83 Mil/uL (ref 3.87–5.11)

## 2012-03-29 LAB — TSH: TSH: 0.44 u[IU]/mL (ref 0.35–5.50)

## 2012-03-29 MED ORDER — CIPROFLOXACIN HCL 500 MG PO TABS: 500.0000 mg | ORAL_TABLET | Freq: Two times a day (BID) | ORAL | Status: DC

## 2012-03-29 NOTE — Progress Notes (Signed)
Quick Note:    Order done     ______

## 2012-04-05 ENCOUNTER — Encounter: Payer: Self-pay | Admitting: Internal Medicine

## 2012-04-05 ENCOUNTER — Ambulatory Visit (INDEPENDENT_AMBULATORY_CARE_PROVIDER_SITE_OTHER): Payer: BC Managed Care – PPO | Admitting: Internal Medicine

## 2012-04-05 VITALS — BP 122/80 | HR 56 | Temp 98.1°F | Resp 16 | Ht 62.0 in | Wt 160.0 lb

## 2012-04-05 DIAGNOSIS — Z0000 Encounter for general adult medical examination without abnormal findings: Secondary | ICD-10-CM

## 2012-04-05 DIAGNOSIS — I10 Essential (primary) hypertension: Secondary | ICD-10-CM

## 2012-04-05 DIAGNOSIS — J309 Allergic rhinitis, unspecified: Secondary | ICD-10-CM

## 2012-04-05 MED ORDER — HYDROCHLOROTHIAZIDE 25 MG PO TABS: 25.0000 mg | ORAL_TABLET | Freq: Every day | ORAL | Status: DC

## 2012-04-05 MED ORDER — LORAZEPAM 0.5 MG PO TABS: 0.5000 mg | ORAL_TABLET | Freq: Two times a day (BID) | ORAL | Status: DC | PRN

## 2012-04-05 NOTE — Patient Instructions (Signed)
Limit your sodium (Salt) intake    It is important that you exercise regularly, at least 20 minutes 3 to 4 times per week.  If you develop chest pain or shortness of breath seek  medical attention.  Please check your blood pressure on a regular basis.  If it is consistently greater than 150/90, please make an office appointment.  Return in one year for follow-up  Take a calcium supplement, plus 800-1200 units of vitamin D 

## 2012-04-05 NOTE — Progress Notes (Signed)
Patient ID: EMMAH BUCHERT, female   DOB: 05/29/1951, 61 y.o.   MRN: [id number]  Subjective:    Patient ID: [name], female    DOB: 06-30-1950, 61 y.o.   MRN: [id number]  Hypertension Pertinent negatives include no chest pain, headaches, palpitations or shortness of breath.     Wt Readings from Last 3 Encounters:  04/05/12 160 lb (72.576 kg)  01/18/12 169 lb (76.658 kg)  12/20/11 171 lb (77.34 kg)   61 -year-old patient who is seen today for a preventive health examination. She has been seen by gynecology in the spring that included a mammogram and Pap. She is doing quite well. She is exercising regularly and goes to a health club on a regular basis. There's been some modest ongoing weight loss. She feels quite well. Her laboratory studies were reviewed and cholesterol was at her all time low. She has no concerns or complaints today. She has a history of colonic polyps and a strong family history of colon cancer. She had followup colonoscopy in May of this year. She was free of polyps at that time and is scheduled for followup in 5 years. She has a history of hypertension which is controlled on diuretic therapy. She has allergic rhinitis mild anxiety and occasional back pain her clinical status has been quite stable.  Allergies:  1) ! Penicillin G Potassium (Penicillin G Potassium)  2) ! Biaxin (Clarithromycin)  3) ! Doxycycline Hyclate (Doxycycline Hyclate)  4) ! Cephalosporins  5) ! Carbaphen 12 (Phenyleph-Chlorphen-Carbetapen)   Past History:  Past Medical History:  Reviewed history from 02/26/2009 and no changes required.  Anxiety  Hypertension  Allergic rhinitis  hand eczema  Colonic polyps, hx of   Past Surgical History:  T&A  Colonoscopy ([facility]) 2008  2013 GYN Tenny Craw)   Family History:  Reviewed history from 02/26/2009 and no changes required.  Family History of Colon CA 1st degree relative <60  Family History of Cardiovascular disorder  2 Brothers, one  sister with colon CA, Hodgkin's Disease  Father- died colon Ca  5 brothers, one sister, colon ca, Hodgins Disease  Mother-HTN, died CVA   Social History:  Reviewed history from 02/26/2009 and no changes required.  Occupation:  Married  Never Smoked  Regular exercise-yes   Review of Systems  Constitutional: Negative for fever, appetite change, fatigue and unexpected weight change.  HENT: Negative for hearing loss, ear pain, nosebleeds, congestion, sore throat, mouth sores, trouble swallowing, neck stiffness, dental problem, voice change, sinus pressure and tinnitus.   Eyes: Negative for photophobia, pain, redness and visual disturbance.  Respiratory: Negative for cough, chest tightness and shortness of breath.   Cardiovascular: Negative for chest pain, palpitations and leg swelling.  Gastrointestinal: Negative for nausea, vomiting, abdominal pain, diarrhea, constipation, blood in stool, abdominal distention and rectal pain.  Genitourinary: Negative for dysuria, urgency, frequency, hematuria, flank pain, vaginal bleeding, vaginal discharge, difficulty urinating, genital sores, vaginal pain, menstrual problem and pelvic pain.  Musculoskeletal: Negative for back pain and arthralgias.  Skin: Negative for rash.  Neurological: Negative for dizziness, syncope, speech difficulty, weakness, light-headedness, numbness and headaches.  Hematological: Negative for adenopathy. Does not bruise/bleed easily.  Psychiatric/Behavioral: Negative for suicidal ideas, behavioral problems, self-injury, dysphoric mood and agitation. The patient is not nervous/anxious.        Objective:   Physical Exam  Constitutional: She is oriented to person, place, and time. She appears well-developed and well-nourished.  HENT:  Head: Normocephalic and atraumatic.  Right Ear: External ear  normal.  Left Ear: External ear normal.  Mouth/Throat: Oropharynx is clear and moist.  Eyes: Conjunctivae normal and EOM are normal.   Neck: Normal range of motion. Neck supple. No JVD present. No thyromegaly present.  Cardiovascular: Normal rate, regular rhythm, normal heart sounds and intact distal pulses.   No murmur heard. Pulmonary/Chest: Effort normal and breath sounds normal. She has no wheezes. She has no rales.  Abdominal: Soft. Bowel sounds are normal. She exhibits no distension and no mass. There is no tenderness. There is no rebound and no guarding.  Musculoskeletal: Normal range of motion. She exhibits no edema and no tenderness.  Neurological: She is alert and oriented to person, place, and time. She has normal reflexes. No cranial nerve deficit. She exhibits normal muscle tone. Coordination normal.  Skin: Skin is warm and dry. No rash noted.  Psychiatric: She has a normal mood and affect. Her behavior is normal.          Assessment & Plan:    Preventive health examination Colonic polyps with family history of colon cancer. Followup colonoscopy next year Hypertension. Well controlled on diuretic therapy Allergic rhinitis stable  Low salt diet encouraged regular exercise will be continued. She is asked to monitor blood pressure readings at home occasionally. Recheck 1 year

## 2012-05-24 ENCOUNTER — Other Ambulatory Visit: Payer: Self-pay | Admitting: Internal Medicine

## 2012-05-24 MED ORDER — AZELASTINE HCL 0.1 % NA SOLN: 1.0000 | Freq: Two times a day (BID) | NASAL | Status: DC

## 2012-05-24 NOTE — Telephone Encounter (Signed)
Pt notified Rx refill for Astelin Nasal Spray sent to pharmacy as requested.

## 2012-05-24 NOTE — Telephone Encounter (Signed)
Pt needs new rx azelastine ns call into [facility]

## 2012-07-18 ENCOUNTER — Telehealth: Payer: Self-pay | Admitting: Internal Medicine

## 2012-07-18 NOTE — Telephone Encounter (Signed)
Patient Information:  [name] Name: Corey  Phone: [phone]  Patient: Kristy Becker, Kristy Becker  Gender: Female  DOB: 02-Oct-1950  Age: 62 Years  PCP: Eleonore Chiquito [facility])  Office Follow Up:  Does the office need to follow up with this patient?: No  Instructions For The Office: N/A  RN Note:  informed Nasra that eating a healthy diet has probably contributed to the weight loss and that losing 5 lbs in 4mos was not an alarming amount  Symptoms  Reason For Call & Symptoms: seen in the office 9/13 and 10/13; continues to lose weight; now is 155; has changed diet d/t acid reflux; denies vomiting; last OV weight was 160  Reviewed Health History In EMR: Yes  Reviewed Medications In EMR: Yes  Reviewed Allergies In EMR: Yes  Reviewed Surgeries / Procedures: Yes  Date of Onset of Symptoms: Unknown  Guideline(s) Used:  No Protocol Available - Information Only  Disposition Per Guideline:   Home Care  Reason For Disposition Reached:   Information only question and nurse able to answer  Advice Given:  Call Back If:  New symptoms develop

## 2012-10-24 ENCOUNTER — Other Ambulatory Visit: Payer: Self-pay | Admitting: Internal Medicine

## 2012-11-01 ENCOUNTER — Other Ambulatory Visit: Payer: Self-pay | Admitting: Internal Medicine

## 2012-11-30 ENCOUNTER — Ambulatory Visit (INDEPENDENT_AMBULATORY_CARE_PROVIDER_SITE_OTHER): Payer: BC Managed Care – PPO | Admitting: Internal Medicine

## 2012-11-30 ENCOUNTER — Encounter: Payer: Self-pay | Admitting: Internal Medicine

## 2012-11-30 VITALS — BP 132/70 | HR 84 | Temp 98.4°F | Resp 20 | Wt 160.0 lb

## 2012-11-30 DIAGNOSIS — F411 Generalized anxiety disorder: Secondary | ICD-10-CM

## 2012-11-30 DIAGNOSIS — I10 Essential (primary) hypertension: Secondary | ICD-10-CM

## 2012-11-30 MED ORDER — LORAZEPAM 0.5 MG PO TABS: 0.5000 mg | ORAL_TABLET | Freq: Two times a day (BID) | ORAL | Status: DC | PRN

## 2012-11-30 NOTE — Patient Instructions (Signed)
Avoids foods high in acid such as tomatoes citrus juices, and spicy foods.  Avoid eating within two hours of lying down or before exercising.  Do not overheat.  Try smaller more frequent meals.  If symptoms persist, elevate the head of her bed 12 inches while sleeping.  Limit your sodium (Salt) intake    It is important that you exercise regularly, at least 20 minutes 3 to 4 times per week.  If you develop chest pain or shortness of breath seek  medical attention.  Return in 6 months for follow-up  

## 2012-11-30 NOTE — Progress Notes (Signed)
Subjective:    Patient ID: Kristy Becker, female    DOB: August 04, 1950, 62 y.o.   MRN: [id number]  HPI  62 year old patient who has treated hypertension and also a history of a anxiety disorder. She is doing quite well only complaint today is some left anterior neck pain with movement. In general doing fairly well. She has been compliant with her medications. She does require a refill on her lorazepam. Blood pressure is controlled with diuretic therapy.  Past Medical History  Diagnosis Date  . ALLERGIC RHINITIS 10/06/2007  . ANXIETY 01/31/2007  . BACK PAIN 08/21/2007  . COLONIC POLYPS, HX OF 02/26/2009  . ECZEMA, HANDS 05/08/2007  . HYPERTENSION 01/31/2007    History   Social History  . Marital Status: Married    Spouse Name: N/A    Number of Children: N/A  . Years of Education: N/A   Occupational History  . Not on file.   Social History Main Topics  . Smoking status: Never Smoker   . Smokeless tobacco: Never Used  . Alcohol Use: No  . Drug Use: No  . Sexually Active: Not on file   Other Topics Concern  . Not on file   Social History Narrative  . No narrative on file    Past Surgical History  Procedure Laterality Date  . Tonsillectomy and adenoidectomy      No family history on file.  Allergies  Allergen Reactions  . Cephalosporins Other (See Comments)    unknown  . Clarithromycin Other (See Comments)    unknown  . Doxycycline Hyclate Other (See Comments)  . Penicillins Hives    Current Outpatient Prescriptions on File Prior to Visit  Medication Sig Dispense Refill  . azelastine (ASTELIN) 137 MCG/SPRAY nasal spray Place 1 spray into the nose 2 (two) times daily. Use in each nostril as directed  30 mL  3  . famotidine (PEPCID) 20 MG tablet TAKE 1 TABLET TWICE A DAY  60 tablet  3  . hydrochlorothiazide (HYDRODIURIL) 25 MG tablet Take 1 tablet (25 mg total) by mouth daily.  90 tablet  6  . Multiple Vitamin (MULTIVITAMIN WITH MINERALS) TABS Take 1 tablet by mouth  daily.      . NON FORMULARY Inject 1-2 each as directed once a week. Weekly allergy injections.  Last injection 11/16/2011.       No current facility-administered medications on file prior to visit.    BP 132/70  Pulse 84  Temp(Src) 98.4 F (36.9 C) (Oral)  Resp 20  Wt 160 lb (72.576 kg)  BMI 29.26 kg/m2  SpO2 98%       Review of Systems  Constitutional: Negative.   HENT: Negative for hearing loss, congestion, sore throat, rhinorrhea, dental problem, sinus pressure and tinnitus.   Eyes: Negative for pain, discharge and visual disturbance.  Respiratory: Negative for cough and shortness of breath.   Cardiovascular: Negative for chest pain, palpitations and leg swelling.  Gastrointestinal: Negative for nausea, vomiting, abdominal pain, diarrhea, constipation, blood in stool and abdominal distention.  Genitourinary: Negative for dysuria, urgency, frequency, hematuria, flank pain, vaginal bleeding, vaginal discharge, difficulty urinating, vaginal pain and pelvic pain.  Musculoskeletal: Negative for joint swelling, arthralgias and gait problem.  Skin: Negative for rash.  Neurological: Negative for dizziness, syncope, speech difficulty, weakness, numbness and headaches.  Hematological: Negative for adenopathy.  Psychiatric/Behavioral: Negative for behavioral problems, dysphoric mood and agitation. The patient is not nervous/anxious.        Objective:   Physical Exam  Constitutional: She is oriented to person, place, and time. She appears well-developed and well-nourished.  HENT:  Head: Normocephalic.  Right Ear: External ear normal.  Left Ear: External ear normal.  Mouth/Throat: Oropharynx is clear and moist.  Eyes: Conjunctivae and EOM are normal. Pupils are equal, round, and reactive to light.  Neck: Normal range of motion. Neck supple. No thyromegaly present.  Cardiovascular: Normal rate, regular rhythm, normal heart sounds and intact distal pulses.   Pulmonary/Chest: Effort  normal and breath sounds normal.  Abdominal: Soft. Bowel sounds are normal. She exhibits no mass. There is no tenderness.  Musculoskeletal: Normal range of motion.  Lymphadenopathy:    She has no cervical adenopathy.  Neurological: She is alert and oriented to person, place, and time.  Skin: Skin is warm and dry. No rash noted.  Psychiatric: She has a normal mood and affect. Her behavior is normal.          Assessment & Plan:   Neck pain (slight tenderness left sternocleidomastoid muscle) Hypertension well controlled Anxiety disorder. Stable. Lorazepam refilled

## 2012-12-27 ENCOUNTER — Other Ambulatory Visit: Payer: Self-pay | Admitting: Obstetrics and Gynecology

## 2013-03-06 ENCOUNTER — Encounter: Payer: Self-pay | Admitting: Internal Medicine

## 2013-03-06 ENCOUNTER — Ambulatory Visit (INDEPENDENT_AMBULATORY_CARE_PROVIDER_SITE_OTHER): Payer: BC Managed Care – PPO | Admitting: Internal Medicine

## 2013-03-06 VITALS — BP 130/76 | HR 59 | Temp 98.2°F | Wt 165.0 lb

## 2013-03-06 DIAGNOSIS — M722 Plantar fascial fibromatosis: Secondary | ICD-10-CM

## 2013-03-06 NOTE — Progress Notes (Signed)
  Subjective:    Patient ID: Kristy Becker, female    DOB: 1950-06-20, 62 y.o.   MRN: [id number]  HPI  63 year old patient who presents with a chief complaint of left heel pain. This hasn't present for about one month and is worsened she first bears weight first and in the morning. There's very little discomfort throughout the day but becomes more painful towards the end of her daily routine. No history of trauma. No prior history of heel pain.  Past Medical History  Diagnosis Date  . ALLERGIC RHINITIS 10/06/2007  . ANXIETY 01/31/2007  . BACK PAIN 08/21/2007  . COLONIC POLYPS, HX OF 02/26/2009  . ECZEMA, HANDS 05/08/2007  . HYPERTENSION 01/31/2007    History   Social History  . Marital Status: Married    Spouse Name: N/A    Number of Children: N/A  . Years of Education: N/A   Occupational History  . Not on file.   Social History Main Topics  . Smoking status: Never Smoker   . Smokeless tobacco: Never Used  . Alcohol Use: No  . Drug Use: No  . Sexual Activity: Not on file   Other Topics Concern  . Not on file   Social History Narrative  . No narrative on file    Past Surgical History  Procedure Laterality Date  . Tonsillectomy and adenoidectomy      No family history on file.  Allergies  Allergen Reactions  . Cephalosporins Other (See Comments)    unknown  . Clarithromycin Other (See Comments)    unknown  . Doxycycline Hyclate Other (See Comments)  . Penicillins Hives    Current Outpatient Prescriptions on File Prior to Visit  Medication Sig Dispense Refill  . azelastine (ASTELIN) 137 MCG/SPRAY nasal spray Place 1 spray into the nose 2 (two) times daily. Use in each nostril as directed  30 mL  3  . cetirizine (ZYRTEC) 10 MG tablet Take 10 mg by mouth daily.      . hydrochlorothiazide (HYDRODIURIL) 25 MG tablet Take 1 tablet (25 mg total) by mouth daily.  90 tablet  6  . LORazepam (ATIVAN) 0.5 MG tablet Take 1 tablet (0.5 mg total) by mouth 2 (two) times daily  as needed. For anxiety.  60 tablet  2  . Multiple Vitamin (MULTIVITAMIN WITH MINERALS) TABS Take 1 tablet by mouth daily.       No current facility-administered medications on file prior to visit.    BP 130/76  Pulse 59  Temp(Src) 98.2 F (36.8 C) (Oral)  Wt 165 lb (74.844 kg)  BMI 30.17 kg/m2  SpO2 98%       Review of Systems  Musculoskeletal: Positive for arthralgias and gait problem.       Objective:   Physical Exam  Constitutional: She appears well-developed and well-nourished. No distress.  126/80  Musculoskeletal:  Tender to palpation over the last posterior lateral heel. No tenderness involving the Achilles tendon or insertion site. No peel edema. Pedal pulses intact.          Assessment & Plan:

## 2013-03-06 NOTE — Patient Instructions (Signed)
Plantar Fasciitis (Heel Spur Syndrome)  with Rehab  The plantar fascia is a fibrous, ligament-like, soft-tissue structure that spans the bottom of the foot. Plantar fasciitis is a condition that causes pain in the foot due to inflammation of the tissue.  SYMPTOMS   · Pain and tenderness on the underneath side of the foot.  · Pain that worsens with standing or walking.  CAUSES   Plantar fasciitis is caused by irritation and injury to the plantar fascia on the underneath side of the foot. Common mechanisms of injury include:  · Direct trauma to bottom of the foot.  · Damage to a small nerve that runs under the foot where the main fascia attaches to the heel bone.  · Stress placed on the plantar fascia due to bone spurs.  RISK INCREASES WITH:   · Activities that place stress on the plantar fascia (running, jumping, pivoting, or cutting).  · Poor strength and flexibility.  · Improperly fitted shoes.  · Tight calf muscles.  · Flat feet.  · Failure to warm-up properly before activity.  · Obesity.  PREVENTION  · Warm up and stretch properly before activity.  · Allow for adequate recovery between workouts.  · Maintain physical fitness:  · Strength, flexibility, and endurance.  · Cardiovascular fitness.  · Maintain a health body weight.  · Avoid stress on the plantar fascia.  · Wear properly fitted shoes, including arch supports for individuals who have flat feet.  PROGNOSIS   If treated properly, then the symptoms of plantar fasciitis usually resolve without surgery. However, occasionally surgery is necessary.  RELATED COMPLICATIONS   · Recurrent symptoms that may result in a chronic condition.  · Problems of the lower back that are caused by compensating for the injury, such as limping.  · Pain or weakness of the foot during push-off following surgery.  · Chronic inflammation, scarring, and partial or complete fascia tear, occurring more often from repeated injections.  TREATMENT   Treatment initially involves the use of  ice and medication to help reduce pain and inflammation. The use of strengthening and stretching exercises may help reduce pain with activity, especially stretches of the Achilles tendon. These exercises may be performed at home or with a therapist. Your caregiver may recommend that you use heel cups of arch supports to help reduce stress on the plantar fascia. Occasionally, corticosteroid injections are given to reduce inflammation. If symptoms persist for greater than 6 months despite non-surgical (conservative), then surgery may be recommended.   MEDICATION   · If pain medication is necessary, then nonsteroidal anti-inflammatory medications, such as aspirin and ibuprofen, or other minor pain relievers, such as acetaminophen, are often recommended.  · Do not take pain medication within 7 days before surgery.  · Prescription pain relievers may be given if deemed necessary by your caregiver. Use only as directed and only as much as you need.  · Corticosteroid injections may be given by your caregiver. These injections should be reserved for the most serious cases, because they may only be given a certain number of times.  HEAT AND COLD  · Cold treatment (icing) relieves pain and reduces inflammation. Cold treatment should be applied for 10 to 15 minutes every 2 to 3 hours for inflammation and pain and immediately after any activity that aggravates your symptoms. Use ice packs or massage the area with a piece of ice (ice massage).  · Heat treatment may be used prior to performing the stretching and strengthening activities prescribed   by your caregiver, physical therapist, or athletic trainer. Use a heat pack or soak the injury in warm water.  SEEK IMMEDIATE MEDICAL CARE IF:  · Treatment seems to offer no benefit, or the condition worsens.  · Any medications produce adverse side effects.  EXERCISES  RANGE OF MOTION (ROM) AND STRETCHING EXERCISES - Plantar Fasciitis (Heel Spur Syndrome)  These exercises may help you  when beginning to rehabilitate your injury. Your symptoms may resolve with or without further involvement from your physician, physical therapist or athletic trainer. While completing these exercises, remember:   · Restoring tissue flexibility helps normal motion to return to the joints. This allows healthier, less painful movement and activity.  · An effective stretch should be held for at least 30 seconds.  · A stretch should never be painful. You should only feel a gentle lengthening or release in the stretched tissue.  RANGE OF MOTION - Toe Extension, Flexion  · Sit with your right / left leg crossed over your opposite knee.  · Grasp your toes and gently pull them back toward the top of your foot. You should feel a stretch on the bottom of your toes and/or foot.  · Hold this stretch for __________ seconds.  · Now, gently pull your toes toward the bottom of your foot. You should feel a stretch on the top of your toes and or foot.  · Hold this stretch for __________ seconds.  Repeat __________ times. Complete this stretch __________ times per day.   RANGE OF MOTION - Ankle Dorsiflexion, Active Assisted  · Remove shoes and sit on a chair that is preferably not on a carpeted surface.  · Place right / left foot under knee. Extend your opposite leg for support.  · Keeping your heel down, slide your right / left foot back toward the chair until you feel a stretch at your ankle or calf. If you do not feel a stretch, slide your bottom forward to the edge of the chair, while still keeping your heel down.  · Hold this stretch for __________ seconds.  Repeat __________ times. Complete this stretch __________ times per day.   STRETCH  Gastroc, Standing  · Place hands on wall.  · Extend right / left leg, keeping the front knee somewhat bent.  · Slightly point your toes inward on your back foot.  · Keeping your right / left heel on the floor and your knee straight, shift your weight toward the wall, not allowing your back to  arch.  · You should feel a gentle stretch in the right / left calf. Hold this position for __________ seconds.  Repeat __________ times. Complete this stretch __________ times per day.  STRETCH  Soleus, Standing  · Place hands on wall.  · Extend right / left leg, keeping the other knee somewhat bent.  · Slightly point your toes inward on your back foot.  · Keep your right / left heel on the floor, bend your back knee, and slightly shift your weight over the back leg so that you feel a gentle stretch deep in your back calf.  · Hold this position for __________ seconds.  Repeat __________ times. Complete this stretch __________ times per day.  STRETCH  Gastrocsoleus, Standing   Note: This exercise can place a lot of stress on your foot and ankle. Please complete this exercise only if specifically instructed by your caregiver.   · Place the ball of your right / left foot on a step, keeping   your other foot firmly on the same step.  · Hold on to the wall or a rail for balance.  · Slowly lift your other foot, allowing your body weight to press your heel down over the edge of the step.  · You should feel a stretch in your right / left calf.  · Hold this position for __________ seconds.  · Repeat this exercise with a slight bend in your right / left knee.  Repeat __________ times. Complete this stretch __________ times per day.   STRENGTHENING EXERCISES - Plantar Fasciitis (Heel Spur Syndrome)   These exercises may help you when beginning to rehabilitate your injury. They may resolve your symptoms with or without further involvement from your physician, physical therapist or athletic trainer. While completing these exercises, remember:   · Muscles can gain both the endurance and the strength needed for everyday activities through controlled exercises.  · Complete these exercises as instructed by your physician, physical therapist or athletic trainer. Progress the resistance and repetitions only as guided.  STRENGTH - Towel  Curls  · Sit in a chair positioned on a non-carpeted surface.  · Place your foot on a towel, keeping your heel on the floor.  · Pull the towel toward your heel by only curling your toes. Keep your heel on the floor.  · If instructed by your physician, physical therapist or athletic trainer, add ____________________ at the end of the towel.  Repeat __________ times. Complete this exercise __________ times per day.  STRENGTH - Ankle Inversion  · Secure one end of a rubber exercise band/tubing to a fixed object (table, pole). Loop the other end around your foot just before your toes.  · Place your fists between your knees. This will focus your strengthening at your ankle.  · Slowly, pull your big toe up and in, making sure the band/tubing is positioned to resist the entire motion.  · Hold this position for __________ seconds.  · Have your muscles resist the band/tubing as it slowly pulls your foot back to the starting position.  Repeat __________ times. Complete this exercises __________ times per day.   Document Released: 05/31/2005 Document Revised: 08/23/2011 Document Reviewed: 09/12/2008  [employer]® Patient Information ©2014 [employer].

## 2013-04-06 ENCOUNTER — Encounter: Payer: Self-pay | Admitting: Internal Medicine

## 2013-04-06 ENCOUNTER — Ambulatory Visit (INDEPENDENT_AMBULATORY_CARE_PROVIDER_SITE_OTHER): Payer: BC Managed Care – PPO | Admitting: Internal Medicine

## 2013-04-06 VITALS — BP 120/70 | HR 60 | Temp 97.9°F | Resp 20 | Ht 61.5 in | Wt 161.0 lb

## 2013-04-06 DIAGNOSIS — I10 Essential (primary) hypertension: Secondary | ICD-10-CM

## 2013-04-06 DIAGNOSIS — Z23 Encounter for immunization: Secondary | ICD-10-CM

## 2013-04-06 DIAGNOSIS — Z86010 Personal history of colonic polyps: Secondary | ICD-10-CM

## 2013-04-06 DIAGNOSIS — Z0000 Encounter for general adult medical examination without abnormal findings: Secondary | ICD-10-CM

## 2013-04-06 DIAGNOSIS — J309 Allergic rhinitis, unspecified: Secondary | ICD-10-CM

## 2013-04-06 LAB — CBC WITH DIFFERENTIAL/PLATELET
Basophils Absolute: 0 10*3/uL (ref 0.0–0.1)
Eosinophils Absolute: 0.1 10*3/uL (ref 0.0–0.7)
MCHC: 34.4 g/dL (ref 30.0–36.0)
MCV: 88.1 fl (ref 78.0–100.0)
Monocytes Absolute: 0.3 10*3/uL (ref 0.1–1.0)
Neutrophils Relative %: 48.9 % (ref 43.0–77.0)
Platelets: 223 10*3/uL (ref 150.0–400.0)
RDW: 12.9 % (ref 11.5–14.6)
WBC: 3.9 10*3/uL — ABNORMAL LOW (ref 4.5–10.5)

## 2013-04-06 LAB — COMPREHENSIVE METABOLIC PANEL
AST: 16 U/L (ref 0–37)
Albumin: 4.1 g/dL (ref 3.5–5.2)
Alkaline Phosphatase: 65 U/L (ref 39–117)
Glucose, Bld: 73 mg/dL (ref 70–99)
Potassium: 3.3 mEq/L — ABNORMAL LOW (ref 3.5–5.1)
Sodium: 139 mEq/L (ref 135–145)
Total Protein: 7.9 g/dL (ref 6.0–8.3)

## 2013-04-06 MED ORDER — LORAZEPAM 0.5 MG PO TABS: 0.5000 mg | ORAL_TABLET | Freq: Two times a day (BID) | ORAL | Status: DC | PRN

## 2013-04-06 MED ORDER — HYDROCHLOROTHIAZIDE 25 MG PO TABS: 25.0000 mg | ORAL_TABLET | Freq: Every day | ORAL | Status: DC

## 2013-04-06 MED ORDER — AZELASTINE HCL 0.1 % NA SOLN: 1.0000 | Freq: Two times a day (BID) | NASAL | Status: DC

## 2013-04-06 NOTE — Patient Instructions (Signed)
Limit your sodium (Salt) intake  Please check your blood pressure on a regular basis.  If it is consistently greater than 150/90, please make an office appointment.    It is important that you exercise regularly, at least 20 minutes 3 to 4 times per week.  If you develop chest pain or shortness of breath seek  medical attention.  Return in one year for follow-up  

## 2013-04-06 NOTE — Progress Notes (Signed)
Patient ID: Kristy Becker, female   DOB: Aug 26, 1950, 62 y.o.   MRN: [id number] Patient ID: Kristy Becker, female   DOB: 22-Nov-1950, 62 y.o.   MRN: [id number]  Subjective:    Patient ID: Kristy Becker, female    DOB: [birth year], 62 y.o.   MRN: [id number]  Hypertension Pertinent negatives include no chest pain, headaches, palpitations or shortness of breath.     Wt Readings from Last 3 Encounters:  04/06/13 161 lb (73.029 kg)  03/06/13 165 lb (74.844 kg)  11/30/12 160 lb (72.72 kg)   62 -year-old patient who is seen today for a preventive health examination. She has been seen by gynecology in the spring that included a mammogram and Pap. She is doing quite well. She is exercising regularly and goes to a health club on a regular basis. There's been some modest ongoing weight loss. She feels quite well. Her laboratory studies were reviewed from last year and cholesterol was at her all time low. She has no concerns or complaints today. She has a history of colonic polyps and a strong family history of colon cancer. She had followup colonoscopy in May of 2013. She was free of polyps at that time and is scheduled for followup in 4 years. She has a history of hypertension which is controlled on diuretic therapy. She has allergic rhinitis mild anxiety and occasional back pain her clinical status has been quite stable.  Allergies:  1) ! Penicillin G Potassium (Penicillin G Potassium)  2) ! Biaxin (Clarithromycin)  3) ! Doxycycline Hyclate (Doxycycline Hyclate)  4) ! Cephalosporins  5) ! Carbaphen 12 (Phenyleph-Chlorphen-Carbetapen)   Past History:  Past Medical History:   Anxiety  Hypertension  Allergic rhinitis  hand eczema  Colonic polyps, hx of   Past Surgical History:  T&A  Colonoscopy ([facility]) 2008  2013 GYN Tenny Craw)   Family History:   Family History of Colon CA 1st degree relative <60  Family History of Cardiovascular disorder  2 Brothers, one sister with colon CA,  Hodgkin's Disease  Father- died colon Ca  5 brothers, one sister, colon ca, Hodgins Disease  Mother-HTN, died CVA   Social History:   Occupation:  Married  Never Smoked  Regular exercise-yes   Review of Systems  Constitutional: Negative for fever, appetite change, fatigue and unexpected weight change.  HENT: Negative for congestion, dental problem, ear pain, hearing loss, mouth sores, nosebleeds, sinus pressure, sore throat, tinnitus, trouble swallowing and voice change.   Eyes: Negative for photophobia, pain, redness and visual disturbance.  Respiratory: Negative for cough, chest tightness and shortness of breath.   Cardiovascular: Negative for chest pain, palpitations and leg swelling.  Gastrointestinal: Negative for nausea, vomiting, abdominal pain, diarrhea, constipation, blood in stool, abdominal distention and rectal pain.  Genitourinary: Negative for dysuria, urgency, frequency, hematuria, flank pain, vaginal bleeding, vaginal discharge, difficulty urinating, genital sores, vaginal pain, menstrual problem and pelvic pain.  Musculoskeletal: Negative for arthralgias, back pain and neck stiffness.  Skin: Negative for rash.  Neurological: Negative for dizziness, syncope, speech difficulty, weakness, light-headedness, numbness and headaches.  Hematological: Negative for adenopathy. Does not bruise/bleed easily.  Psychiatric/Behavioral: Negative for suicidal ideas, behavioral problems, self-injury, dysphoric mood and agitation. The patient is not nervous/anxious.        Objective:   Physical Exam  Constitutional: She is oriented to person, place, and time. She appears well-developed and well-nourished.  HENT:  Head: Normocephalic and atraumatic.  Right Ear: External ear normal.  Left Ear: External ear  normal.  Mouth/Throat: Oropharynx is clear and moist.  Eyes: Conjunctivae and EOM are normal.  Neck: Normal range of motion. Neck supple. No JVD present. No thyromegaly present.   Cardiovascular: Normal rate, regular rhythm, normal heart sounds and intact distal pulses.   No murmur heard. Dorsalis pedis pulses full. Posterior tibia pulses faint  Pulmonary/Chest: Effort normal and breath sounds normal. She has no wheezes. She has no rales.  Abdominal: Soft. Bowel sounds are normal. She exhibits no distension and no mass. There is no tenderness. There is no rebound and no guarding.  Musculoskeletal: Normal range of motion. She exhibits no edema and no tenderness.  Neurological: She is alert and oriented to person, place, and time. She has normal reflexes. No cranial nerve deficit. She exhibits normal muscle tone. Coordination normal.  Skin: Skin is warm and dry. No rash noted.  Psychiatric: She has a normal mood and affect. Her behavior is normal.          Assessment & Plan:    Preventive health examination Colonic polyps with family history of colon cancer. Followup colonoscopy at five-year intervals Hypertension. Well controlled on diuretic therapy Allergic rhinitis stable  Low salt diet encouraged regular exercise will be continued. She is asked to monitor blood pressure readings at home occasionally. Recheck 1 year

## 2013-04-21 ENCOUNTER — Other Ambulatory Visit: Payer: Self-pay | Admitting: Internal Medicine

## 2013-04-23 ENCOUNTER — Other Ambulatory Visit: Payer: Self-pay | Admitting: Internal Medicine

## 2013-06-21 ENCOUNTER — Telehealth: Payer: Self-pay | Admitting: Internal Medicine

## 2013-06-21 NOTE — Telephone Encounter (Signed)
Patient Information:  [name] Name: [place]  Phone: [phone]  Patient: Kristy Becker, Kristy Becker  Gender: Female  DOB: [birth year]  Age: 63 Years  PCP: Bluford Kaufmann (Family Practice > 52yrs old)  Office Follow Up:  Does the office need to follow up with this patient?: No  Instructions For The Office: N/A   Symptoms  Reason For Call & Symptoms: Ankle cramp last night that made it difficult to walk and uncomfortable as she slept. Has improved today and improved more after she walked this morning. Now reports mild pain. Able to walk without limping. No swelling or discoloration (nothing visible). No known injury.  Reviewed Health History In EMR: Yes  Reviewed Medications In EMR: Yes  Reviewed Allergies In EMR: Yes  Reviewed Surgeries / Procedures: Yes  Date of Onset of Symptoms: 06/20/2013  Treatments Tried: walking, stretching  Treatments Tried Worked: Yes  Guideline(s) Used:  Ankle Pain  Disposition Per Guideline:   Home Care  Reason For Disposition Reached:   Ankle pain  Advice Given:  Reassurance:  The symptoms you describe do not sound serious. You have told me that there is no redness, swelling, or fever. You have also told me that there has been no recent major injury.  Ankle pain can be caused be mild arthritis and other minor problems.  Here is some care advice that should help.  Pain Medicines:  For pain relief, you can take either acetaminophen, ibuprofen, or naproxen.  They are over-the-counter (OTC) pain drugs. You can buy them at the drugstore.  Extra Notes :  Use the lowest amount of medicine that makes your pain feel better.  Acetaminophen is thought to be safer than ibuprofen or naproxen in people over 71 years old. Acetaminophen is in many OTC and prescription medicines. It might be in more than one medicine that you are taking. You need to be careful and not take an overdose. An acetaminophen overdose can hurt the liver.  Caution: Do not take ibuprofen if you  have stomach problems, kidney disease, are pregnant, or have been told by your doctor to avoid this type of anti-inflammatory drug. Do not take ibuprofen for more than 7 days without consulting your doctor.  Before taking any medicine, read all the instructions on the package.  Pain Medicines:  For pain relief, you can take either acetaminophen, ibuprofen, or naproxen.  They are over-the-counter (OTC) pain drugs. You can buy them at the drugstore.  Extra Notes :  Use the lowest amount of medicine that makes your pain feel better.  Acetaminophen is thought to be safer than ibuprofen or naproxen in people over 11 years old. Acetaminophen is in many OTC and prescription medicines. It might be in more than one medicine that you are taking. You need to be careful and not take an overdose. An acetaminophen overdose can hurt the liver.  Caution: Do not take ibuprofen if you have stomach problems, kidney disease, are pregnant, or have been told by your doctor to avoid this type of anti-inflammatory drug. Do not take ibuprofen for more than 7 days without consulting your doctor.  Before taking any medicine, read all the instructions on the package.  Expected Course:   Pain from a mild strain or joint irritation usually get better within a week.  If this does not get better during the next week, you should make an appointment to see your doctor.  Call Back If:  Moderate pain (e.g. limping) lasts over 3 days  Mild pain lasts over  7 days  You become worse.  Call Back If:  Moderate pain (e.g. limping) lasts over 3 days  Mild pain lasts over 7 days  You become worse.  Patient Will Follow Care Advice:  YES

## 2013-10-29 ENCOUNTER — Other Ambulatory Visit: Payer: Self-pay | Admitting: Internal Medicine

## 2013-11-15 ENCOUNTER — Ambulatory Visit (INDEPENDENT_AMBULATORY_CARE_PROVIDER_SITE_OTHER): Payer: BC Managed Care – PPO | Admitting: Internal Medicine

## 2013-11-15 ENCOUNTER — Encounter: Payer: Self-pay | Admitting: Internal Medicine

## 2013-11-15 VITALS — BP 140/76 | HR 71 | Temp 98.4°F | Resp 20 | Ht 61.5 in | Wt 167.0 lb

## 2013-11-15 DIAGNOSIS — I10 Essential (primary) hypertension: Secondary | ICD-10-CM

## 2013-11-15 DIAGNOSIS — J309 Allergic rhinitis, unspecified: Secondary | ICD-10-CM

## 2013-11-15 NOTE — Progress Notes (Signed)
Subjective:    Patient ID: Kristy Becker, female    DOB: 07-28-1950, 63 y.o.   MRN: [id number]  HPI  63 year old patient who has treated hypertension, and allergic rhinitis.  The past 3 weeks.  She has had slight worsening of dry mouth.  Medical regimen does include diuretic therapy as well as Zyrtec.  Dry mouth seems fairly minor.  She is followed closely by allergy medicine.  She does have a history of anxiety disorder. Past Medical History  Diagnosis Date  . ALLERGIC RHINITIS 10/06/2007  . ANXIETY 01/31/2007  . BACK PAIN 08/21/2007  . COLONIC POLYPS, HX OF 02/26/2009  . ECZEMA, HANDS 05/08/2007  . HYPERTENSION 01/31/2007    History   Social History  . Marital Status: Married    Spouse Name: N/A    Number of Children: N/A  . Years of Education: N/A   Occupational History  . Not on file.   Social History Main Topics  . Smoking status: Never Smoker   . Smokeless tobacco: Never Used  . Alcohol Use: No  . Drug Use: No  . Sexual Activity: Not on file   Other Topics Concern  . Not on file   Social History Narrative  . No narrative on file    Past Surgical History  Procedure Laterality Date  . Tonsillectomy and adenoidectomy      No family history on file.  Allergies  Allergen Reactions  . Cephalosporins Other (See Comments)    unknown  . Clarithromycin Other (See Comments)    unknown  . Doxycycline Hyclate Other (See Comments)  . Penicillins Hives    Current Outpatient Prescriptions on File Prior to Visit  Medication Sig Dispense Refill  . cetirizine (ZYRTEC) 10 MG tablet Take 10 mg by mouth daily.      Marland Kitchen esomeprazole (NEXIUM) 40 MG capsule Take 40 mg by mouth daily before breakfast.      . hydrochlorothiazide (HYDRODIURIL) 25 MG tablet Take 1 tablet (25 mg total) by mouth daily.  90 tablet  3  . LORazepam (ATIVAN) 0.5 MG tablet Take 1 tablet (0.5 mg total) by mouth 2 (two) times daily as needed. For anxiety.  60 tablet  2  . Multiple Vitamin (MULTIVITAMIN  WITH MINERALS) TABS Take 1 tablet by mouth daily.       No current facility-administered medications on file prior to visit.    BP 140/76  Pulse 71  Temp(Src) 98.4 F (36.9 C) (Oral)  Resp 20  Ht 5' 1.5" (1.562 m)  Wt 167 lb (75.751 kg)  BMI 31.05 kg/m2  SpO2 98%         Review of Systems  Constitutional: Negative.   HENT: Negative for congestion, dental problem, hearing loss, rhinorrhea, sinus pressure, sore throat and tinnitus.   Eyes: Negative for pain, discharge and visual disturbance.  Respiratory: Negative for cough and shortness of breath.   Cardiovascular: Negative for chest pain, palpitations and leg swelling.  Gastrointestinal: Negative for nausea, vomiting, abdominal pain, diarrhea, constipation, blood in stool and abdominal distention.  Genitourinary: Negative for dysuria, urgency, frequency, hematuria, flank pain, vaginal bleeding, vaginal discharge, difficulty urinating, vaginal pain and pelvic pain.  Musculoskeletal: Negative for arthralgias, gait problem and joint swelling.  Skin: Negative for rash.  Neurological: Negative for dizziness, syncope, speech difficulty, weakness, numbness and headaches.  Hematological: Negative for adenopathy.  Psychiatric/Behavioral: Negative for behavioral problems, dysphoric mood and agitation. The patient is not nervous/anxious.        Objective:  Physical Exam  Constitutional: She is oriented to person, place, and time. She appears well-developed and well-nourished.  HENT:  Head: Normocephalic.  Right Ear: External ear normal.  Left Ear: External ear normal.  Mouth/Throat: Oropharynx is clear and moist.  Mucosal membranes appear pink and moist  Eyes: Conjunctivae and EOM are normal. Pupils are equal, round, and reactive to light.  Neck: Normal range of motion. Neck supple. No thyromegaly present.  Cardiovascular: Normal rate, regular rhythm, normal heart sounds and intact distal pulses.   Pulmonary/Chest: Effort  normal and breath sounds normal.  Abdominal: Soft. Bowel sounds are normal. She exhibits no mass. There is no tenderness.  Musculoskeletal: Normal range of motion.  Lymphadenopathy:    She has no cervical adenopathy.  Neurological: She is alert and oriented to person, place, and time.  Skin: Skin is warm and dry. No rash noted.  Psychiatric: She has a normal mood and affect. Her behavior is normal.          Assessment & Plan:  Dry mouth, possible mild side effect of medications.  Will try ice chips and sips of water or hard candy.  We'll observe at this time.  She feels she benefits greatly from her allergy medication Hypertension well controlled  Followup allergy medicine CPX here as scheduled in October.

## 2013-11-15 NOTE — Patient Instructions (Signed)
Call or return to clinic prn if these symptoms worsen or fail to improve as anticipated.  Annual exam as scheduled 

## 2013-11-15 NOTE — Progress Notes (Signed)
Pre-visit discussion using our clinic review tool. No additional management support is needed unless otherwise documented below in the visit note.  

## 2013-11-16 ENCOUNTER — Telehealth: Payer: Self-pay | Admitting: Internal Medicine

## 2013-11-16 NOTE — Telephone Encounter (Signed)
Relevant patient education assigned to patient using Emmi. ° °

## 2014-01-04 ENCOUNTER — Other Ambulatory Visit: Payer: Self-pay | Admitting: Obstetrics and Gynecology

## 2014-01-07 LAB — CYTOLOGY - PAP

## 2014-01-28 ENCOUNTER — Telehealth: Payer: Self-pay | Admitting: Internal Medicine

## 2014-01-28 NOTE — Telephone Encounter (Signed)
Pt has been sch for 01-29-14

## 2014-01-28 NOTE — Telephone Encounter (Signed)
Patient Information:  [name] Name: Averie  Phone: [phone]  Patient: Kristy Becker, Kristy Becker  Gender: Female  DOB: Jul 02, 1950  Age: 63 Years  PCP: [facility])  Office Follow Up:  Does the office need to follow up with this patient?: Yes  Instructions For The Office: Pt only wants to see Dr. Raliegh Ip for this. Refused to be scheduled with another Provider. Dr. Raliegh Ip only has 1 open appt today for a physical at 1:30. Could office staff look at this and see if she could be scheduled in that appt time? Or if she could be fit in anywhere else in his schedule.   Symptoms  Reason For Call & Symptoms: Pt woke up in the middle of the night with her heart racing . The episode lasted 10 minutes. She did not get SOB. It was too fast to count. She could not check her BP at that time her machine wouldn't register it. She has woken up this am with no other symptoms. 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  Reviewed Health History In EMR: Yes  Reviewed Medications In EMR: Yes  Reviewed Allergies In EMR: Yes  Reviewed Surgeries / Procedures: Yes  Date of Onset of Symptoms: 01/27/2014  Guideline(s) Used:  Heart Rate and Heartbeat Questions  Disposition Per Guideline:   See Today in Office  Reason For Disposition Reached:   Heart beating very rapidly (e.g., > 140 / minute) and not present now (EXCEPTION: during exercise)  Advice Given:  N/A  Patient Will Follow Care Advice:  YES

## 2014-01-28 NOTE — Telephone Encounter (Signed)
Please call pt and schedule appointment for today with another provider, due to no open slots, If pt refuses schedule to see Dr. Raliegh Ip tomorrow.

## 2014-01-29 ENCOUNTER — Ambulatory Visit (INDEPENDENT_AMBULATORY_CARE_PROVIDER_SITE_OTHER): Payer: BC Managed Care – PPO | Admitting: Internal Medicine

## 2014-01-29 ENCOUNTER — Encounter: Payer: Self-pay | Admitting: Internal Medicine

## 2014-01-29 VITALS — BP 136/84 | HR 68 | Temp 98.2°F | Resp 20 | Ht 61.5 in | Wt 164.0 lb

## 2014-01-29 DIAGNOSIS — R002 Palpitations: Secondary | ICD-10-CM

## 2014-01-29 DIAGNOSIS — I10 Essential (primary) hypertension: Secondary | ICD-10-CM

## 2014-01-29 DIAGNOSIS — L259 Unspecified contact dermatitis, unspecified cause: Secondary | ICD-10-CM

## 2014-01-29 DIAGNOSIS — F411 Generalized anxiety disorder: Secondary | ICD-10-CM

## 2014-01-29 MED ORDER — TRIAMCINOLONE ACETONIDE 0.1 % EX CREA: 1.0000 "application " | TOPICAL_CREAM | Freq: Two times a day (BID) | CUTANEOUS | Status: DC

## 2014-01-29 MED ORDER — LORAZEPAM 0.5 MG PO TABS: 0.5000 mg | ORAL_TABLET | Freq: Two times a day (BID) | ORAL | Status: DC | PRN

## 2014-01-29 NOTE — Patient Instructions (Signed)
Call or return to clinic prn if these symptoms worsen or fail to improve as anticipated. Palpitations A palpitation is the feeling that your heartbeat is irregular. It may feel like your heart is fluttering or skipping a beat. It may also feel like your heart is beating faster than normal. This is usually not a serious problem. In some cases, you may need more medical tests. HOME CARE  Avoid:  Caffeine in coffee, tea, soft drinks, diet pills, and energy drinks.  Chocolate.  Alcohol.  Stop smoking if you smoke.  Reduce your stress and anxiety. Try:  A method that measures bodily functions so you can learn to control them (biofeedback).  Yoga.  Meditation.  Physical activity such as swimming, jogging, or walking.  Get plenty of rest and sleep. GET HELP IF:  Your fast or irregular heartbeat continues after 24 hours.  Your palpitations occur more often. GET HELP RIGHT AWAY IF:   You have chest pain.  You feel short of breath.  You have a very bad headache.  You feel dizzy or pass out (faint). MAKE SURE YOU:   Understand these instructions.  Will watch your condition.  Will get help right away if you are not doing well or get worse. Document Released: 03/09/2008 Document Revised: 10/15/2013 Document Reviewed: 07/30/2011 [facility] Patient Information 2015 [place], [place]. This information is not intended to replace advice given to you by your health care provider. Make sure you discuss any questions you have with your health care provider.

## 2014-01-29 NOTE — Progress Notes (Signed)
Left message on voicemail to call office.  

## 2014-01-29 NOTE — Progress Notes (Signed)
Subjective:    Patient ID: Kristy Becker, female    DOB: Dec 18, 1950, 63 y.o.   MRN: [id number]  HPI  A 63 year old patient who has a history of hypertension and anxiety.  She also has a history of reflux and avoidance of caffeinated products.  For the past 2 weeks.  She has had occasional palpitations.  These were quite severe.  2 nights ago.  She has been under some situational stress.  She does have some allergic rhinitis, but no decongestant use.  She does use fluticasone nasal spray and Zyrtec.  She is requesting a refill on topical steroids.  Due to her hand eczema  Past Medical History  Diagnosis Date  . ALLERGIC RHINITIS 10/06/2007  . ANXIETY 01/31/2007  . BACK PAIN 08/21/2007  . COLONIC POLYPS, HX OF 02/26/2009  . ECZEMA, HANDS 05/08/2007  . HYPERTENSION 01/31/2007    History   Social History  . Marital Status: Married    Spouse Name: N/A    Number of Children: N/A  . Years of Education: N/A   Occupational History  . Not on file.   Social History Main Topics  . Smoking status: Never Smoker   . Smokeless tobacco: Never Used  . Alcohol Use: No  . Drug Use: No  . Sexual Activity: Not on file   Other Topics Concern  . Not on file   Social History Narrative  . No narrative on file    Past Surgical History  Procedure Laterality Date  . Tonsillectomy and adenoidectomy      No family history on file.  Allergies  Allergen Reactions  . Cephalosporins Other (See Comments)    unknown  . Clarithromycin Other (See Comments)    unknown  . Doxycycline Hyclate Other (See Comments)  . Penicillins Hives    Current Outpatient Prescriptions on File Prior to Visit  Medication Sig Dispense Refill  . cetirizine (ZYRTEC) 10 MG tablet Take 10 mg by mouth daily.      Marland Kitchen esomeprazole (NEXIUM) 40 MG capsule Take 40 mg by mouth daily before breakfast.      . hydrochlorothiazide (HYDRODIURIL) 25 MG tablet Take 1 tablet (25 mg total) by mouth daily.  90 tablet  3  . Multiple  Vitamin (MULTIVITAMIN WITH MINERALS) TABS Take 1 tablet by mouth daily.       No current facility-administered medications on file prior to visit.    BP 136/84  Pulse 68  Temp(Src) 98.2 F (36.8 C) (Oral)  Resp 20  Ht 5' 1.5" (1.562 m)  Wt 164 lb (74.39 kg)  BMI 30.49 kg/m2  SpO2 98%       Review of Systems  Constitutional: Negative.   HENT: Negative for congestion, dental problem, hearing loss, rhinorrhea, sinus pressure, sore throat and tinnitus.   Eyes: Negative for pain, discharge and visual disturbance.  Respiratory: Negative for cough and shortness of breath.   Cardiovascular: Positive for palpitations. Negative for chest pain and leg swelling.  Gastrointestinal: Negative for nausea, vomiting, abdominal pain, diarrhea, constipation, blood in stool and abdominal distention.  Genitourinary: Negative for dysuria, urgency, frequency, hematuria, flank pain, vaginal bleeding, vaginal discharge, difficulty urinating, vaginal pain and pelvic pain.  Musculoskeletal: Negative for arthralgias, gait problem and joint swelling.  Skin: Positive for rash.  Neurological: Negative for dizziness, syncope, speech difficulty, weakness, numbness and headaches.  Hematological: Negative for adenopathy.  Psychiatric/Behavioral: Negative for behavioral problems, dysphoric mood and agitation. The patient is not nervous/anxious.  Objective:   Physical Exam  Constitutional: She is oriented to person, place, and time. She appears well-developed and well-nourished.  HENT:  Head: Normocephalic.  Right Ear: External ear normal.  Left Ear: External ear normal.  Mouth/Throat: Oropharynx is clear and moist.  Eyes: Conjunctivae and EOM are normal. Pupils are equal, round, and reactive to light.  Neck: Normal range of motion. Neck supple. No thyromegaly present.  Cardiovascular: Normal rate, regular rhythm, normal heart sounds and intact distal pulses.   Pulmonary/Chest: Effort normal and  breath sounds normal.  Abdominal: Soft. Bowel sounds are normal. She exhibits no mass. There is no tenderness.  Musculoskeletal: Normal range of motion.  Lymphadenopathy:    She has no cervical adenopathy.  Neurological: She is alert and oriented to person, place, and time.  Skin: Skin is warm and dry. No rash noted.  Dry, flaky skin involving the hands  Psychiatric: She has a normal mood and affect. Her behavior is normal.          Assessment & Plan:   Palpitations Normal EKG Hypertension stable Anxiety disorder.  Lorazepam refilled Hand eczema.  Triamcinolone refilled  Recheck 6 months or as needed

## 2014-02-07 ENCOUNTER — Telehealth: Payer: Self-pay | Admitting: Internal Medicine

## 2014-02-07 DIAGNOSIS — L309 Dermatitis, unspecified: Secondary | ICD-10-CM

## 2014-02-07 NOTE — Telephone Encounter (Signed)
ok 

## 2014-02-07 NOTE — Telephone Encounter (Signed)
Okay to send referral to Dermatology?

## 2014-02-07 NOTE — Telephone Encounter (Signed)
Pt called and said Dr Raliegh Ip was going to refer her to a dermatologist for her eczema but she has not heard anything. She said they discuss this in her last appt

## 2014-02-07 NOTE — Telephone Encounter (Signed)
Spoke to pt, told her order done for referral to Dermatology and someone will be in contact with her regarding an appt. Pt verbalized understanding.

## 2014-03-05 ENCOUNTER — Encounter: Payer: Self-pay | Admitting: *Deleted

## 2014-03-05 ENCOUNTER — Encounter: Payer: Self-pay | Admitting: Internal Medicine

## 2014-03-05 ENCOUNTER — Ambulatory Visit (INDEPENDENT_AMBULATORY_CARE_PROVIDER_SITE_OTHER): Payer: BC Managed Care – PPO | Admitting: Internal Medicine

## 2014-03-05 VITALS — BP 122/70 | HR 66 | Temp 98.4°F | Resp 20 | Ht 61.5 in | Wt 165.0 lb

## 2014-03-05 DIAGNOSIS — I10 Essential (primary) hypertension: Secondary | ICD-10-CM

## 2014-03-05 DIAGNOSIS — B351 Tinea unguium: Secondary | ICD-10-CM

## 2014-03-05 NOTE — Progress Notes (Signed)
Pre visit review using our clinic review tool, if applicable. No additional management support is needed unless otherwise documented below in the visit note. 

## 2014-03-05 NOTE — Patient Instructions (Signed)
Annual exam as scheduledRingworm, Nail A fungal infection of the nail (tinea unguium/onychomycosis) is common. It is common as the visible part of the nail is composed of dead cells which have no blood supply to help prevent infection. It occurs because fungi are everywhere and will pick any opportunity to grow on any dead material. Because nails are very slow growing they require up to 2 years of treatment with anti-fungal medications. The entire nail back to the base is infected. This includes approximately  of the nail which you cannot see. If your caregiver has prescribed a medication by mouth, take it every day and as directed. No progress will be seen for at least 6 to 9 months. Do not be disappointed! Because fungi live on dead cells with little or no exposure to blood supply, medication delivery to the infection is slow; thus the cure is slow. It is also why you can observe no progress in the first 6 months. The nail becoming cured is the base of the nail, as it has the blood supply. Topical medication such as creams and ointments are usually not effective. Important in successful treatment of nail fungus is closely following the medication regimen that your doctor prescribes. Sometimes you and your caregiver may elect to speed up this process by surgical removal of all the nails. Even this may still require 6 to 9 months of additional oral medications. See your caregiver as directed. Remember there will be no visible improvement for at least 6 months. See your caregiver sooner if other signs of infection (redness and swelling) develop. Document Released: 05/28/2000 Document Revised: 08/23/2011 Document Reviewed: 08/06/2008 [facility] Patient Information 2015 [place], [place]. This information is not intended to replace advice given to you by your health care provider. Make sure you discuss any questions you have with your health care provider.

## 2014-03-05 NOTE — Progress Notes (Signed)
   Subjective:    Patient ID: Kristy Becker, female    DOB: 30-Mar-1951, 63 y.o.   MRN: [id number]  HPI  63 year old patient who has a history of treated hypertension.  She is seen today with a chief complaint of toenail discoloration.  She has tried a number of topical therapies for discoloration of her right second toenail without success.  There is no pain  Past Medical History  Diagnosis Date  . ALLERGIC RHINITIS 10/06/2007  . ANXIETY 01/31/2007  . BACK PAIN 08/21/2007  . COLONIC POLYPS, HX OF 02/26/2009  . ECZEMA, HANDS 05/08/2007  . HYPERTENSION 01/31/2007    History   Social History  . Marital Status: Married    Spouse Name: N/A    Number of Children: N/A  . Years of Education: N/A   Occupational History  . Not on file.   Social History Main Topics  . Smoking status: Never Smoker   . Smokeless tobacco: Never Used  . Alcohol Use: No  . Drug Use: No  . Sexual Activity: Not on file   Other Topics Concern  . Not on file   Social History Narrative  . No narrative on file    Past Surgical History  Procedure Laterality Date  . Tonsillectomy and adenoidectomy      No family history on file.  Allergies  Allergen Reactions  . Cephalosporins Other (See Comments)    unknown  . Clarithromycin Other (See Comments)    unknown  . Doxycycline Hyclate Other (See Comments)  . Penicillins Hives    Current Outpatient Prescriptions on File Prior to Visit  Medication Sig Dispense Refill  . cetirizine (ZYRTEC) 10 MG tablet Take 10 mg by mouth daily.      Marland Kitchen esomeprazole (NEXIUM) 40 MG capsule Take 40 mg by mouth daily before breakfast.      . fluticasone (FLONASE) 50 MCG/ACT nasal spray Place 1 spray into both nostrils daily.      . hydrochlorothiazide (HYDRODIURIL) 25 MG tablet Take 1 tablet (25 mg total) by mouth daily.  90 tablet  3  . LORazepam (ATIVAN) 0.5 MG tablet Take 1 tablet (0.5 mg total) by mouth 2 (two) times daily as needed. For anxiety.  60 tablet  2  .  Multiple Vitamin (MULTIVITAMIN WITH MINERALS) TABS Take 1 tablet by mouth daily.      Marland Kitchen triamcinolone cream (KENALOG) 0.1 % Apply 1 application topically 2 (two) times daily.  45 g  2   No current facility-administered medications on file prior to visit.    BP 122/70  Pulse 66  Temp(Src) 98.4 F (36.9 C) (Oral)  Resp 20  Ht 5' 1.5" (1.562 m)  Wt 165 lb (74.844 kg)  BMI 30.68 kg/m2  SpO2 97%     Review of Systems  Skin: Positive for rash.       Objective:   Physical Exam  Constitutional: She appears well-developed and well-nourished. No distress.  Skin:  The right second toenail  is discolored But not thickened or distorted          Assessment & Plan:   Probable toenail onychomycosis, right second nail.  Options discussed.  She was informed that topical therapies have a very, very low success rate.  It was elected to keep the nail trimmed and the nail polished for cosmetic concerns and to avoid systemic therapy at this time Hypertension well controlled  CPX as scheduled

## 2014-04-01 ENCOUNTER — Telehealth: Payer: Self-pay | Admitting: Internal Medicine

## 2014-04-01 NOTE — Telephone Encounter (Signed)
Patient Information:  [name] Name: Jaydn  Phone: [phone]  Patient: Kristy Becker, Kristy Becker  Gender: Female  DOB: Jan 06, 1951  Age: 63 Years  PCP: [facility] (Family Practice > 43yrs old)  Office Follow Up:  Does the office need to follow up with this patient?: No  Instructions For The Office: N/A  RN Note:  No appt available today for patient to be seen. Scheduled 04/02/14 at 10:15. Care advice and call back parameters reviewed. Understanding expressed  Symptoms  Reason For Call & Symptoms: Patient was seen in office on 03/05/14. Blood pressure was 122/70/ She is on HCTZ 25mg  daily.  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  When her pressure is elevated she hurts behind her left ear.  Today, While at her husbands physician appt she stood up she started walking she felt dizziness and lightheaded. Lasted seconds.  Blood pressure was 160/78.    Repeat at home 154/82. P 61.   She is not having any chest pain , shortness of breath.  Reviewed Health History In EMR: Yes  Reviewed Medications In EMR: Yes  Reviewed Allergies In EMR: Yes  Reviewed Surgeries / Procedures: Yes  Date of Onset of Symptoms: 04/01/2014  Guideline(s) Used:  High Blood Pressure  Disposition Per Guideline:   See Today in Office  Reason For Disposition Reached:   Patient wants to be seen  Advice Given:  Call Back If:  Headache, blurred vision, difficulty talking, or difficulty walking occurs  Chest pain or difficulty breathing occurs  You become worse.  Lifestyle Changes  Maintain a healthy weight. Lose weight if you are overweight.  Do 30 minutes of aerobic physical activity (e.g., brisk walking) most days of the week.  Eat a diet high in fresh fruits and low-fat dairy products. Limit your intake of saturated and total fat. Choose foods that are lower in salt.  If you smoke, you should stop.  If you drink alcohol, you should limit your  daily alcohol drinking. Women should have no more than one drink per day. Men should have no more than 2 drinks per day. A drink is defined as 1.5 oz hard liquor (one shot or jigger; 45 ml), 5 oz wine (small glass; 150 ml), or 12 oz beer (one can; 360 ml).  Patient Will Follow Care Advice:  YES  Appointment Scheduled:  04/02/2014 10:15:00 Appointment Scheduled Provider:  [facility] (Family Practice > 21yrs old)

## 2014-04-01 NOTE — Telephone Encounter (Signed)
Noted  

## 2014-04-02 ENCOUNTER — Ambulatory Visit: Payer: Self-pay | Admitting: Internal Medicine

## 2014-04-03 ENCOUNTER — Encounter: Payer: Self-pay | Admitting: Family Medicine

## 2014-04-03 ENCOUNTER — Ambulatory Visit (INDEPENDENT_AMBULATORY_CARE_PROVIDER_SITE_OTHER): Payer: BC Managed Care – PPO | Admitting: Family Medicine

## 2014-04-03 VITALS — BP 154/90 | HR 75 | Temp 97.9°F | Wt 164.0 lb

## 2014-04-03 DIAGNOSIS — I10 Essential (primary) hypertension: Secondary | ICD-10-CM

## 2014-04-03 NOTE — Patient Instructions (Signed)

## 2014-04-03 NOTE — Progress Notes (Signed)
Pre visit review using our clinic review tool, if applicable. No additional management support is needed unless otherwise documented below in the visit note. 

## 2014-04-03 NOTE — Progress Notes (Signed)
   Subjective:    Patient ID: Kristy Becker, female    DOB: November 21, 1950, 63 y.o.   MRN: [id number]  Hypertension Pertinent negatives include no chest pain, headaches or shortness of breath.    Patient seen with concern for elevated blood pressure. She has hypertension treated with HCTZ. Over the past several days she's had several home readings ranging from 379 systolic to high 024 systolic with diastolics from 90. No headaches. Mild ankle edema. No alcohol use. Low sodium diet. No nonsteroidals or decongestants.  She is dealing with tremendous stress with her husband having Parkinson's disease. She attributes her elevated blood pressure to that. Patient states she had transient blurred vision left eye earlier today but this lasted only one or 2 seconds. She denies any speech changes or any focal weakness.  Past Medical History  Diagnosis Date  . ALLERGIC RHINITIS 10/06/2007  . ANXIETY 01/31/2007  . BACK PAIN 08/21/2007  . COLONIC POLYPS, HX OF 02/26/2009  . ECZEMA, HANDS 05/08/2007  . HYPERTENSION 01/31/2007   Past Surgical History  Procedure Laterality Date  . Tonsillectomy and adenoidectomy      reports that she has never smoked. She has never used smokeless tobacco. She reports that she does not drink alcohol or use illicit drugs. family history is not on file. Allergies  Allergen Reactions  . Cephalosporins Other (See Comments)    unknown  . Clarithromycin Other (See Comments)    unknown  . Doxycycline Hyclate Other (See Comments)  . Penicillins Hives      Review of Systems  Constitutional: Positive for fatigue. Negative for fever and chills.  Respiratory: Negative for cough, chest tightness, shortness of breath and wheezing.   Cardiovascular: Negative for chest pain and leg swelling.  Neurological: Negative for dizziness, seizures, syncope, weakness, light-headedness and headaches.       Objective:   Physical Exam  Constitutional: She is oriented to person, place, and  time. She appears well-developed and well-nourished.  Neck: Neck supple. No thyromegaly present.  No bruits  Cardiovascular: Normal rate and regular rhythm.   Pulmonary/Chest: Effort normal and breath sounds normal. No respiratory distress. She has no wheezes. She has no rales.  Musculoskeletal: She exhibits no edema.  Neurological: She is alert and oriented to person, place, and time. No cranial nerve deficit.          Assessment & Plan:  Elevated blood pressure. We obtained reading here repeat of 154/80 left arm seated. We discussed stress management. We discussed possible addition of second medication to HCTZ but will defer to followup with primary on Monday if blood pressure still elevated. She'll continue home monitoring. Continue weight loss efforts.

## 2014-04-08 ENCOUNTER — Ambulatory Visit (INDEPENDENT_AMBULATORY_CARE_PROVIDER_SITE_OTHER): Payer: BC Managed Care – PPO | Admitting: Internal Medicine

## 2014-04-08 ENCOUNTER — Encounter: Payer: Self-pay | Admitting: Internal Medicine

## 2014-04-08 VITALS — BP 140/72 | HR 64 | Temp 98.0°F | Resp 20 | Ht 62.0 in | Wt 163.0 lb

## 2014-04-08 DIAGNOSIS — I10 Essential (primary) hypertension: Secondary | ICD-10-CM

## 2014-04-08 DIAGNOSIS — Z86010 Personal history of colonic polyps: Secondary | ICD-10-CM

## 2014-04-08 DIAGNOSIS — M545 Low back pain, unspecified: Secondary | ICD-10-CM

## 2014-04-08 DIAGNOSIS — Z0000 Encounter for general adult medical examination without abnormal findings: Secondary | ICD-10-CM

## 2014-04-08 DIAGNOSIS — Z23 Encounter for immunization: Secondary | ICD-10-CM

## 2014-04-08 LAB — CBC WITH DIFFERENTIAL/PLATELET
BASOS PCT: 0.5 % (ref 0.0–3.0)
Basophils Absolute: 0 10*3/uL (ref 0.0–0.1)
EOS PCT: 1.4 % (ref 0.0–5.0)
Eosinophils Absolute: 0.1 10*3/uL (ref 0.0–0.7)
HCT: 42.9 % (ref 36.0–46.0)
HEMOGLOBIN: 14.2 g/dL (ref 12.0–15.0)
LYMPHS PCT: 34.7 % (ref 12.0–46.0)
Lymphs Abs: 1.8 10*3/uL (ref 0.7–4.0)
MCHC: 33.2 g/dL (ref 30.0–36.0)
MCV: 89.1 fl (ref 78.0–100.0)
MONO ABS: 0.5 10*3/uL (ref 0.1–1.0)
Monocytes Relative: 8.9 % (ref 3.0–12.0)
NEUTROS ABS: 2.8 10*3/uL (ref 1.4–7.7)
NEUTROS PCT: 54.5 % (ref 43.0–77.0)
Platelets: 232 10*3/uL (ref 150.0–400.0)
RBC: 4.82 Mil/uL (ref 3.87–5.11)
RDW: 13 % (ref 11.5–15.5)
WBC: 5.1 10*3/uL (ref 4.0–10.5)

## 2014-04-08 LAB — COMPREHENSIVE METABOLIC PANEL
ALBUMIN: 3.5 g/dL (ref 3.5–5.2)
ALK PHOS: 66 U/L (ref 39–117)
ALT: 24 U/L (ref 0–35)
AST: 20 U/L (ref 0–37)
BUN: 16 mg/dL (ref 6–23)
CO2: 31 mEq/L (ref 19–32)
CREATININE: 0.8 mg/dL (ref 0.4–1.2)
Calcium: 9.4 mg/dL (ref 8.4–10.5)
Chloride: 100 mEq/L (ref 96–112)
GFR: 98.37 mL/min (ref >60.00)
GLUCOSE: 85 mg/dL (ref 70–99)
POTASSIUM: 3.5 meq/L (ref 3.5–5.1)
Sodium: 137 mEq/L (ref 135–145)
Total Bilirubin: 0.6 mg/dL (ref 0.2–1.2)
Total Protein: 7.9 g/dL (ref 6.0–8.3)

## 2014-04-08 LAB — LIPID PANEL
CHOL/HDL RATIO: 3
CHOLESTEROL: 182 mg/dL (ref 0–200)
HDL: 55 mg/dL (ref >39.00)
LDL CALC: 117 mg/dL — AB (ref 0–99)
NonHDL: 127
TRIGLYCERIDES: 52 mg/dL (ref 0.0–149.0)
VLDL: 10.4 mg/dL (ref 0.0–40.0)

## 2014-04-08 LAB — TSH: TSH: 0.39 u[IU]/mL (ref 0.35–4.50)

## 2014-04-08 MED ORDER — LORAZEPAM 0.5 MG PO TABS: 0.5000 mg | ORAL_TABLET | Freq: Two times a day (BID) | ORAL | Status: DC | PRN

## 2014-04-08 NOTE — Progress Notes (Signed)
Patient ID: Kristy Becker, female   DOB: 1951-01-17, 63 y.o.   MRN: [id number] Patient ID: Kristy Becker, female   DOB: 10/14/1950, 63 y.o.   MRN: [id number]  Subjective:    Patient ID: Kristy Becker, female    DOB: [birth year], 63 y.o.   MRN: [id number]  Hypertension Pertinent negatives include no chest pain, headaches, palpitations or shortness of breath.     Wt Readings from Last 3 Encounters:  04/08/14 163 lb (73.936 kg)  04/03/14 164 lb (74.39 kg)  03/05/14 165 lb (74.9 kg)   63 -year-old patient who is seen today for a preventive health examination.  She has been seen by gynecology in the spring that included a mammogram and Pap. She is doing quite well. She is exercising regularly and goes to a health club on a regular basis. There's been some modest ongoing weight loss. She feels quite well. Her laboratory studies were reviewed from last year and cholesterol was at her all time low. She has no concerns or complaints today. She has a history of colonic polyps and a strong family history of colon cancer. She had followup colonoscopy in May of 2013. She was free of polyps at that time and is scheduled for followup in 3 years. She has a history of hypertension which is controlled on diuretic therapy. She has allergic rhinitis mild anxiety and occasional back pain her clinical status has been quite stable. She has been under situational stress due to the health of her husband with PD  Allergies:  1) ! Penicillin G Potassium (Penicillin G Potassium)  2) ! Biaxin (Clarithromycin)  3) ! Doxycycline Hyclate (Doxycycline Hyclate)  4) ! Cephalosporins  5) ! Carbaphen 12 (Phenyleph-Chlorphen-Carbetapen)   Past History:  Past Medical History:   Anxiety  Hypertension  Allergic rhinitis  hand eczema  Colonic polyps, hx of   Past Surgical History:  T&A  Colonoscopy ([facility]) 2008  2013 [facility])   Family History:   Family History of Colon CA 1st degree relative <60  Family  History of Cardiovascular disorder  2 Brothers, one sister with colon CA, Hodgkin's Disease  Father- died colon Ca  5 brothers, one sister, colon ca, Hodgins Disease  Mother-HTN, died CVA   Social History:   Occupation:  Married  Never Smoked  Regular exercise-yes   Review of Systems  Constitutional: Negative for fever, appetite change, fatigue and unexpected weight change.  HENT: Negative for congestion, dental problem, ear pain, hearing loss, mouth sores, nosebleeds, sinus pressure, sore throat, tinnitus, trouble swallowing and voice change.   Eyes: Negative for photophobia, pain, redness and visual disturbance.  Respiratory: Negative for cough, chest tightness and shortness of breath.   Cardiovascular: Negative for chest pain, palpitations and leg swelling.  Gastrointestinal: Negative for nausea, vomiting, abdominal pain, diarrhea, constipation, blood in stool, abdominal distention and rectal pain.  Genitourinary: Negative for dysuria, urgency, frequency, hematuria, flank pain, vaginal bleeding, vaginal discharge, difficulty urinating, genital sores, vaginal pain, menstrual problem and pelvic pain.  Musculoskeletal: Negative for arthralgias, back pain and neck stiffness.  Skin: Negative for rash.  Neurological: Negative for dizziness, syncope, speech difficulty, weakness, light-headedness, numbness and headaches.  Hematological: Negative for adenopathy. Does not bruise/bleed easily.  Psychiatric/Behavioral: Negative for suicidal ideas, behavioral problems, self-injury, dysphoric mood and agitation. The patient is not nervous/anxious.        Objective:   Physical Exam  Constitutional: She is oriented to person, place, and time. She appears well-developed and well-nourished.  HENT:  Head: Normocephalic and atraumatic.  Right Ear: External ear normal.  Left Ear: External ear normal.  Mouth/Throat: Oropharynx is clear and moist.  Low hanging soft palate  Eyes: Conjunctivae and  EOM are normal.  Neck: Normal range of motion. Neck supple. No JVD present. No thyromegaly present.  Cardiovascular: Normal rate, regular rhythm, normal heart sounds and intact distal pulses.   No murmur heard. Dorsalis pedis pulses full. Posterior tibia pulses faint  Pulmonary/Chest: Effort normal and breath sounds normal. She has no wheezes. She has no rales.  Abdominal: Soft. Bowel sounds are normal. She exhibits no distension and no mass. There is no tenderness. There is no rebound and no guarding.  Musculoskeletal: Normal range of motion. She exhibits no edema and no tenderness.  Neurological: She is alert and oriented to person, place, and time. She has normal reflexes. No cranial nerve deficit. She exhibits normal muscle tone. Coordination normal.  Skin: Skin is warm and dry. No rash noted.  Psychiatric: She has a normal mood and affect. Her behavior is normal.          Assessment & Plan:    Preventive health examination Colonic polyps with family history of colon cancer. Followup colonoscopy at five-year intervals Hypertension. Well controlled on diuretic therapy Allergic rhinitis stable  Low salt diet encouraged regular exercise will be continued. She is asked to monitor blood pressure readings at home occasionally. Recheck 1 year

## 2014-04-08 NOTE — Progress Notes (Signed)
Pre visit review using our clinic review tool, if applicable. No additional management support is needed unless otherwise documented below in the visit note. 

## 2014-04-08 NOTE — Patient Instructions (Addendum)
Limit your sodium (Salt) intake  Please check your blood pressure on a regular basis.  If it is consistently greater than 150/90, please make an office appointment.  Health Maintenance Adopting a healthy lifestyle and getting preventive care can go a long way to promote health and wellness. Talk with your health care provider about what schedule of regular examinations is right for you. This is a good chance for you to check in with your provider about disease prevention and staying healthy. In between checkups, there are plenty of things you can do on your own. Experts have done a lot of research about which lifestyle changes and preventive measures are most likely to keep you healthy. Ask your health care provider for more information. WEIGHT AND DIET  Eat a healthy diet  Be sure to include plenty of vegetables, fruits, low-fat dairy products, and lean protein.  Do not eat a lot of foods high in solid fats, added sugars, or salt.  Get regular exercise. This is one of the most important things you can do for your health.  Most adults should exercise for at least 150 minutes each week. The exercise should increase your heart rate and make you sweat (moderate-intensity exercise).  Most adults should also do strengthening exercises at least twice a week. This is in addition to the moderate-intensity exercise.  Maintain a healthy weight  Body mass index (BMI) is a measurement that can be used to identify possible weight problems. It estimates body fat based on height and weight. Your health care provider can help determine your BMI and help you achieve or maintain a healthy weight.  For females 64 years of age and older:   A BMI below 18.5 is considered underweight.  A BMI of 18.5 to 24.9 is normal.  A BMI of 25 to 29.9 is considered overweight.  A BMI of 30 and above is considered obese.  Watch levels of cholesterol and blood lipids  You should start having your blood tested for  lipids and cholesterol at 64 years of age, then have this test every 5 years.  You may need to have your cholesterol levels checked more often if:  Your lipid or cholesterol levels are high.  You are older than 64 years of age.  You are at high risk for heart disease.  CANCER SCREENING   Lung Cancer  Lung cancer screening is recommended for adults 64-17 years old who are at high risk for lung cancer because of a history of smoking.  A yearly low-dose CT scan of the lungs is recommended for people who:  Currently smoke.  Have quit within the past 15 years.  Have at least a 30-pack-year history of smoking. A pack year is smoking an average of one pack of cigarettes a day for 1 year.  Yearly screening should continue until it has been 15 years since you quit.  Yearly screening should stop if you develop a health problem that would prevent you from having lung cancer treatment.  Breast Cancer  Practice breast self-awareness. This means understanding how your breasts normally appear and feel.  It also means doing regular breast self-exams. Let your health care provider know about any changes, no matter how small.  If you are in your 64s or 30s, you should have a clinical breast exam (CBE) by a health care provider every 1-3 years as part of a regular health exam.  If you are 64 or older, have a CBE every year. Also consider having  a breast X-ray (mammogram) every year.  If you have a family history of breast cancer, talk to your health care provider about genetic screening.  If you are at high risk for breast cancer, talk to your health care provider about having an MRI and a mammogram every year.  Breast cancer gene (BRCA) assessment is recommended for women who have family members with BRCA-related cancers. BRCA-related cancers include:  Breast.  Ovarian.  Tubal.  Peritoneal cancers.  Results of the assessment will determine the need for genetic counseling and BRCA1  and BRCA2 testing. Cervical Cancer Routine pelvic examinations to screen for cervical cancer are no longer recommended for nonpregnant women who are considered low risk for cancer of the pelvic organs (ovaries, uterus, and vagina) and who do not have symptoms. A pelvic examination may be necessary if you have symptoms including those associated with pelvic infections. Ask your health care provider if a screening pelvic exam is right for you.   The Pap test is the screening test for cervical cancer for women who are considered at risk.  If you had a hysterectomy for a problem that was not cancer or a condition that could lead to cancer, then you no longer need Pap tests.  If you are older than 64 years, and you have had normal Pap tests for the past 10 years, you no longer need to have Pap tests.  If you have had past treatment for cervical cancer or a condition that could lead to cancer, you need Pap tests and screening for cancer for at least 20 years after your treatment.  If you no longer get a Pap test, assess your risk factors if they change (such as having a new sexual partner). This can affect whether you should start being screened again.  Some women have medical problems that increase their chance of getting cervical cancer. If this is the case for you, your health care provider may recommend more frequent screening and Pap tests.  The human papillomavirus (HPV) test is another test that may be used for cervical cancer screening. The HPV test looks for the virus that can cause cell changes in the cervix. The cells collected during the Pap test can be tested for HPV.  The HPV test can be used to screen women 64 years of age and older. Getting tested for HPV can extend the interval between normal Pap tests from three to five years.  An HPV test also should be used to screen women of any age who have unclear Pap test results.  After 64 years of age, women should have HPV testing as often  as Pap tests.  Colorectal Cancer  This type of cancer can be detected and often prevented.  Routine colorectal cancer screening usually begins at 64 years of age and continues through 64 years of age.  Your health care provider may recommend screening at an earlier age if you have risk factors for colon cancer.  Your health care provider may also recommend using home test kits to check for hidden blood in the stool.  A small camera at the end of a tube can be used to examine your colon directly (sigmoidoscopy or colonoscopy). This is done to check for the earliest forms of colorectal cancer.  Routine screening usually begins at age 39.  Direct examination of the colon should be repeated every 5-10 years through 64 years of age. However, you may need to be screened more often if early forms of precancerous polyps  or small growths are found. Skin Cancer  Check your skin from head to toe regularly.  Tell your health care provider about any new moles or changes in moles, especially if there is a change in a mole's shape or color.  Also tell your health care provider if you have a mole that is larger than the size of a pencil eraser.  Always use sunscreen. Apply sunscreen liberally and repeatedly throughout the day.  Protect yourself by wearing long sleeves, pants, a wide-brimmed hat, and sunglasses whenever you are outside. HEART DISEASE, DIABETES, AND HIGH BLOOD PRESSURE   Have your blood pressure checked at least every 1-2 years. High blood pressure causes heart disease and increases the risk of stroke.  If you are between 78 years and 56 years old, ask your health care provider if you should take aspirin to prevent strokes.  Have regular diabetes screenings. This involves taking a blood sample to check your fasting blood sugar level.  If you are at a normal weight and have a low risk for diabetes, have this test once every three years after 64 years of age.  If you are overweight  and have a high risk for diabetes, consider being tested at a younger age or more often. PREVENTING INFECTION  Hepatitis B  If you have a higher risk for hepatitis B, you should be screened for this virus. You are considered at high risk for hepatitis B if:  You were born in a country where hepatitis B is common. Ask your health care provider which countries are considered high risk.  Your parents were born in a high-risk country, and you have not been immunized against hepatitis B (hepatitis B vaccine).  You have HIV or AIDS.  You use needles to inject street drugs.  You live with someone who has hepatitis B.  You have had sex with someone who has hepatitis B.  You get hemodialysis treatment.  You take certain medicines for conditions, including cancer, organ transplantation, and autoimmune conditions. Hepatitis C  Blood testing is recommended for:  Everyone born from [birth year] through 1966.  Anyone with known risk factors for hepatitis C. Sexually transmitted infections (STIs)  You should be screened for sexually transmitted infections (STIs) including gonorrhea and chlamydia if:  You are sexually active and are younger than 64 years of age.  You are older than 64 years of age and your health care provider tells you that you are at risk for this type of infection.  Your sexual activity has changed since you were last screened and you are at an increased risk for chlamydia or gonorrhea. Ask your health care provider if you are at risk.  If you do not have HIV, but are at risk, it may be recommended that you take a prescription medicine daily to prevent HIV infection. This is called pre-exposure prophylaxis (PrEP). You are considered at risk if:  You are sexually active and do not regularly use condoms or know the HIV status of your partner(s).  You take drugs by injection.  You are sexually active with a partner who has HIV. Talk with your health care provider about whether  you are at high risk of being infected with HIV. If you choose to begin PrEP, you should first be tested for HIV. You should then be tested every 3 months for as long as you are taking PrEP.  PREGNANCY   If you are premenopausal and you may become pregnant, ask your health care provider about  preconception counseling.  If you may become pregnant, take 400 to 800 micrograms (mcg) of folic acid every day.  If you want to prevent pregnancy, talk to your health care provider about birth control (contraception). OSTEOPOROSIS AND MENOPAUSE   Osteoporosis is a disease in which the bones lose minerals and strength with aging. This can result in serious bone fractures. Your risk for osteoporosis can be identified using a bone density scan.  If you are 52 years of age or older, or if you are at risk for osteoporosis and fractures, ask your health care provider if you should be screened.  Ask your health care provider whether you should take a calcium or vitamin D supplement to lower your risk for osteoporosis.  Menopause may have certain physical symptoms and risks.  Hormone replacement therapy may reduce some of these symptoms and risks. Talk to your health care provider about whether hormone replacement therapy is right for you.  HOME CARE INSTRUCTIONS   Schedule regular health, dental, and eye exams.  Stay current with your immunizations.   Do not use any tobacco products including cigarettes, chewing tobacco, or electronic cigarettes.  If you are pregnant, do not drink alcohol.  If you are breastfeeding, limit how much and how often you drink alcohol.  Limit alcohol intake to no more than 1 drink per day for nonpregnant women. One drink equals 12 ounces of beer, 5 ounces of wine, or 1 ounces of hard liquor.  Do not use street drugs.  Do not share needles.  Ask your health care provider for help if you need support or information about quitting drugs.  Tell your health care  provider if you often feel depressed.  Tell your health care provider if you have ever been abused or do not feel safe at home. Document Released: 12/14/2010 Document Revised: 10/15/2013 Document Reviewed: 05/02/2013 [facility] Patient Information 2015 [place], [place]. This information is not intended to replace advice given to you by your health care provider. Make sure you discuss any questions you have with your health care provider.

## 2014-04-22 ENCOUNTER — Other Ambulatory Visit: Payer: Self-pay | Admitting: Internal Medicine

## 2014-06-12 ENCOUNTER — Ambulatory Visit (INDEPENDENT_AMBULATORY_CARE_PROVIDER_SITE_OTHER): Payer: BC Managed Care – PPO | Admitting: Internal Medicine

## 2014-06-12 ENCOUNTER — Encounter: Payer: Self-pay | Admitting: Internal Medicine

## 2014-06-12 VITALS — BP 150/80 | HR 73 | Temp 97.9°F | Wt 162.0 lb

## 2014-06-12 DIAGNOSIS — I10 Essential (primary) hypertension: Secondary | ICD-10-CM

## 2014-06-12 MED ORDER — LISINOPRIL-HYDROCHLOROTHIAZIDE 20-25 MG PO TABS: 1.0000 | ORAL_TABLET | Freq: Every day | ORAL | Status: DC

## 2014-06-12 NOTE — Patient Instructions (Signed)
Limit your sodium (Salt) intake  Please check your blood pressure on a regular basis.  If it is consistently greater than 150/90, please make an office appointment.     It is important that you exercise regularly, at least 20 minutes 3 to 4 times per week.  If you develop chest pain or shortness of breath seek  medical attention. 

## 2014-06-12 NOTE — Progress Notes (Signed)
Pre visit review using our clinic review tool, if applicable. No additional management support is needed unless otherwise documented below in the visit note. 

## 2014-06-12 NOTE — Progress Notes (Signed)
Subjective:    Patient ID: Kristy Becker, female    DOB: October 11, 1950, 63 y.o.   MRN: [id number]  HPI  63 year old patient who has a history of essential hypertension treated with diuretic therapy.  She monitors home blood pressure readings carefully and has had consistent elevation of her systolic readings.  Readings are occasionally, in excess of 180.  She generally feels well.  She is very anxious and does use lorazepam sparingly.  Past Medical History  Diagnosis Date  . ALLERGIC RHINITIS 10/06/2007  . ANXIETY 01/31/2007  . BACK PAIN 08/21/2007  . COLONIC POLYPS, HX OF 02/26/2009  . ECZEMA, HANDS 05/08/2007  . HYPERTENSION 01/31/2007    History   Social History  . Marital Status: Married    Spouse Name: N/A    Number of Children: N/A  . Years of Education: N/A   Occupational History  . Not on file.   Social History Main Topics  . Smoking status: Never Smoker   . Smokeless tobacco: Never Used  . Alcohol Use: No  . Drug Use: No  . Sexual Activity: Not on file   Other Topics Concern  . Not on file   Social History Narrative    Past Surgical History  Procedure Laterality Date  . Tonsillectomy and adenoidectomy      No family history on file.  Allergies  Allergen Reactions  . Cephalosporins Other (See Comments)    unknown  . Clarithromycin Other (See Comments)    unknown  . Doxycycline Hyclate Other (See Comments)  . Penicillins Hives    Current Outpatient Prescriptions on File Prior to Visit  Medication Sig Dispense Refill  . esomeprazole (NEXIUM) 40 MG capsule Take 40 mg by mouth daily before breakfast.    . fluticasone (FLONASE) 50 MCG/ACT nasal spray Place 1 spray into both nostrils daily.    . hydrochlorothiazide (HYDRODIURIL) 25 MG tablet Take 1 tablet (25 mg total) by mouth daily. 90 tablet 3  . hydrochlorothiazide (HYDRODIURIL) 25 MG tablet TAKE 1 TABLET BY MOUTH EVERY DAY 90 tablet 1  . hydrochlorothiazide (HYDRODIURIL) 25 MG tablet TAKE 1 TABLET  BY MOUTH EVERY DAY 90 tablet 1  . LORazepam (ATIVAN) 0.5 MG tablet Take 1 tablet (0.5 mg total) by mouth 2 (two) times daily as needed. For anxiety. 60 tablet 5  . miconazole (MICOTIN) 2 % cream Apply 1 application topically at bedtime.    . Multiple Vitamin (MULTIVITAMIN WITH MINERALS) TABS Take 1 tablet by mouth daily.     No current facility-administered medications on file prior to visit.    BP 150/80 mmHg  Pulse 73  Temp(Src) 97.9 F (36.6 C) (Oral)  Wt 162 lb (73.483 kg)  SpO2 98%     Review of Systems  Constitutional: Negative.   HENT: Negative for congestion, dental problem, hearing loss, rhinorrhea, sinus pressure, sore throat and tinnitus.   Eyes: Negative for pain, discharge and visual disturbance.  Respiratory: Negative for cough and shortness of breath.   Cardiovascular: Negative for chest pain, palpitations and leg swelling.  Gastrointestinal: Negative for nausea, vomiting, abdominal pain, diarrhea, constipation, blood in stool and abdominal distention.  Genitourinary: Negative for dysuria, urgency, frequency, hematuria, flank pain, vaginal bleeding, vaginal discharge, difficulty urinating, vaginal pain and pelvic pain.  Musculoskeletal: Negative for joint swelling, arthralgias and gait problem.  Skin: Negative for rash.  Neurological: Negative for dizziness, syncope, speech difficulty, weakness, numbness and headaches.  Hematological: Negative for adenopathy.  Psychiatric/Behavioral: Negative for behavioral problems, dysphoric mood and  agitation. The patient is nervous/anxious.        Objective:   Physical Exam  Constitutional: She is oriented to person, place, and time. She appears well-developed and well-nourished.  Blood pressure 150/80 both arms  HENT:  Head: Normocephalic.  Right Ear: External ear normal.  Left Ear: External ear normal.  Mouth/Throat: Oropharynx is clear and moist.  Eyes: Conjunctivae and EOM are normal. Pupils are equal, round, and  reactive to light.  Neck: Normal range of motion. Neck supple. No thyromegaly present.  Cardiovascular: Normal rate, regular rhythm, normal heart sounds and intact distal pulses.   Pulmonary/Chest: Effort normal and breath sounds normal.  Abdominal: Soft. Bowel sounds are normal. She exhibits no mass. There is no tenderness.  Musculoskeletal: Normal range of motion.  Lymphadenopathy:    She has no cervical adenopathy.  Neurological: She is alert and oriented to person, place, and time.  Skin: Skin is warm and dry. No rash noted.  Psychiatric: She has a normal mood and affect. Her behavior is normal.          Assessment & Plan:   Hypertension.  We'll switch to a combination of lisinopril hydrochlorothiazide Low-salt diet recommended We'll continue home blood pressure monitoring Anxiety disorder  Recheck 3 months or as needed

## 2014-06-12 NOTE — Progress Notes (Signed)
   Subjective:    Patient ID: Kristy Becker, female    DOB: November 25, 1950, 63 y.o.   MRN: [id number]  HPI  BP Readings from Last 3 Encounters:  06/12/14 150/80  04/08/14 140/72  04/03/14 154/90      Review of Systems     Objective:   Physical Exam        Assessment & Plan:

## 2014-07-12 ENCOUNTER — Telehealth: Payer: Self-pay | Admitting: Internal Medicine

## 2014-07-12 NOTE — Telephone Encounter (Signed)
Patient Name: Kristy Becker DOB: 08/09/1950 Initial Comment [name] states her anxiety med says to take 2x daily, wants to know how to space them apart, also having high blood pressure and rapid heartbeat Nurse Assessment Nurse: Marcelline Deist, RN, Kermit Balo Date/Time (Eastern Time): 07/12/2014 10:06:10 AM Confirm and document reason for call. If symptomatic, describe symptoms. ---[name] states her anxiety med. (Lorazepam 0.5 mg) says to take 2 x daily as needed, wants to know how to space them apart. Her BP is good, although she has been noticing rapid heartbeat, even at rest. Her Dr. put her on another BP rx about 2 weeks ago. Has the patient traveled out of the country within the last 30 days? ---Not Applicable Does the patient require triage? ---Yes Related visit to physician within the last 2 weeks? ---Yes Does the PT have any chronic conditions? (i.e. diabetes, asthma, etc.) ---Yes List chronic conditions. ---on BP rx, anxiety, allergies Guidelines Guideline Title Affirmed Question Affirmed Notes Heart Rate and Heartbeat Questions Taking water pill (i.e., diuretic) or heart medication (e.g., digoxin) Final Disposition User See Physician within 4 Hours (or PCP triage) Marcelline Deist, RN, Lynda Comments Attempted to find an appt. in office today, nothing available. [name] is asking whether she needs her anxiety rx increased a bit or if she can take another one if needed during the day (more than 2 as prescribed). She will call office to see if there has been a cancellation & if she could get seen after nurse informed her that an UC would be the best alternative.

## 2014-07-12 NOTE — Telephone Encounter (Signed)
Spoke to pt, told her okay to increase Lorazepam three times day as needed, so can take one every 8 hours. Pt verbalized understanding. Asked pt how blood pressure is? Pt said good but was having some rapid heartbeat and the nurse was concerned. Asked pt if having now? Pt stated no. Told her okay monitor and we will see her Monday. Pt verbalized understanding.

## 2014-07-12 NOTE — Telephone Encounter (Signed)
Okay to take a third anxiolytic as needed

## 2014-07-12 NOTE — Telephone Encounter (Signed)
Please see message and advise 

## 2014-07-12 NOTE — Telephone Encounter (Signed)
Pt fu on message. appt scheduled for Monday at 2:30. Pt wuld like a cb

## 2014-07-12 NOTE — Telephone Encounter (Signed)
Patient Name: Kristy Becker DOB: Aug 15, 1950 Initial Comment [name] states her anxiety med says to take 2x daily, wants to know how to space them apart, also having high blood pressure and rapid heartbeat Nurse Assessment Nurse: Kristy Deist, RN, Kristy Becker Date/Time (Eastern Time): 07/12/2014 10:06:10 AM Confirm and document reason for call. If symptomatic, describe symptoms. ---[name] states her anxiety med. (Lorazepam 0.5 mg) says to take 2 x daily as needed, wants to know how to space them apart. Her BP is good, although she has been noticing rapid heartbeat, even at rest. Her Dr. put her on another BP rx about 2 weeks ago. Has the patient traveled out of the country within the last 30 days? ---Not Applicable Does the patient require triage? ---Yes Related visit to physician within the last 2 weeks? ---Yes Does the PT have any chronic conditions? (i.e. diabetes, asthma, etc.) ---Yes List chronic conditions. ---on BP rx, anxiety, allergies Guidelines Guideline Title Affirmed Question Affirmed Notes Heart Rate and Heartbeat Questions Taking water pill (i.e., diuretic) or heart medication (e.g., digoxin) Final Disposition User See Physician within 4 Hours (or PCP triage) Kristy Deist, RN, Kristy Becker Comments Attempted to find an appt. in office today, nothing available. [name] is asking whether she needs her anxiety rx increased a bit or if she can take another one if needed during the day (more than 2 as prescribed). She will call office to see if there has been a cancellation & if she could get seen after nurse informed her that an UC would be the best alternative.

## 2014-07-15 ENCOUNTER — Encounter: Payer: Self-pay | Admitting: Internal Medicine

## 2014-07-15 ENCOUNTER — Ambulatory Visit (INDEPENDENT_AMBULATORY_CARE_PROVIDER_SITE_OTHER): Payer: BC Managed Care – PPO | Admitting: Internal Medicine

## 2014-07-15 VITALS — BP 120/64 | HR 74 | Temp 98.6°F | Resp 20 | Ht 62.0 in | Wt 163.0 lb

## 2014-07-15 DIAGNOSIS — R002 Palpitations: Secondary | ICD-10-CM

## 2014-07-15 DIAGNOSIS — I10 Essential (primary) hypertension: Secondary | ICD-10-CM

## 2014-07-15 DIAGNOSIS — F411 Generalized anxiety disorder: Secondary | ICD-10-CM

## 2014-07-15 MED ORDER — LORAZEPAM 0.5 MG PO TABS: 0.5000 mg | ORAL_TABLET | Freq: Two times a day (BID) | ORAL | Status: DC | PRN

## 2014-07-15 NOTE — Progress Notes (Signed)
Subjective:    Patient ID: Kristy Becker, female    DOB: 08/20/1950, 64 y.o.   MRN: [id number]  HPI 64 year old patient who has a history of an anxiety disorder.  She is doing quite well, but still quite anxious with the flares from time to time.  She states that she is very easily excitable.  At times she notes some tachycardia but does not sound particularly fast or forceful.  This generally occurs at night.  She is on lorazepam twice daily when necessary.  She has treated hypertension which has done well.  Past Medical History  Diagnosis Date  . ALLERGIC RHINITIS 10/06/2007  . ANXIETY 01/31/2007  . BACK PAIN 08/21/2007  . COLONIC POLYPS, HX OF 02/26/2009  . ECZEMA, HANDS 05/08/2007  . HYPERTENSION 01/31/2007    History   Social History  . Marital Status: Married    Spouse Name: N/A    Number of Children: N/A  . Years of Education: N/A   Occupational History  . Not on file.   Social History Main Topics  . Smoking status: Never Smoker   . Smokeless tobacco: Never Used  . Alcohol Use: No  . Drug Use: No  . Sexual Activity: Not on file   Other Topics Concern  . Not on file   Social History Narrative    Past Surgical History  Procedure Laterality Date  . Tonsillectomy and adenoidectomy      No family history on file.  Allergies  Allergen Reactions  . Cephalosporins Other (See Comments)    unknown  . Clarithromycin Other (See Comments)    unknown  . Doxycycline Hyclate Other (See Comments)  . Penicillins Hives    Current Outpatient Prescriptions on File Prior to Visit  Medication Sig Dispense Refill  . esomeprazole (NEXIUM) 40 MG capsule Take 40 mg by mouth daily before breakfast.    . fluticasone (FLONASE) 50 MCG/ACT nasal spray Place 1 spray into both nostrils daily.    Marland Kitchen lisinopril-hydrochlorothiazide (PRINZIDE,ZESTORETIC) 20-25 MG per tablet Take 1 tablet by mouth daily. 90 tablet 3  . miconazole (MICOTIN) 2 % cream Apply 1 application topically at  bedtime.    . Multiple Vitamin (MULTIVITAMIN WITH MINERALS) TABS Take 1 tablet by mouth daily.    Marland Kitchen triamcinolone cream (KENALOG) 0.1 %   2   No current facility-administered medications on file prior to visit.    BP 120/64 mmHg  Pulse 74  Temp(Src) 98.6 F (37 C) (Oral)  Resp 20  Ht 5\' 2"  (1.575 m)  Wt 163 lb (73.936 kg)  BMI 29.81 kg/m2  SpO2 98%      Review of Systems  Constitutional: Negative.   HENT: Negative for congestion, dental problem, hearing loss, rhinorrhea, sinus pressure, sore throat and tinnitus.   Eyes: Negative for pain, discharge and visual disturbance.  Respiratory: Negative for cough and shortness of breath.   Cardiovascular: Positive for palpitations. Negative for chest pain and leg swelling.  Gastrointestinal: Negative for nausea, vomiting, abdominal pain, diarrhea, constipation, blood in stool and abdominal distention.  Genitourinary: Negative for dysuria, urgency, frequency, hematuria, flank pain, vaginal bleeding, vaginal discharge, difficulty urinating, vaginal pain and pelvic pain.  Musculoskeletal: Negative for joint swelling, arthralgias and gait problem.  Skin: Negative for rash.  Neurological: Negative for dizziness, syncope, speech difficulty, weakness, numbness and headaches.  Hematological: Negative for adenopathy.  Psychiatric/Behavioral: Negative for behavioral problems, dysphoric mood and agitation. The patient is nervous/anxious.        Objective:  Physical Exam  Constitutional: She is oriented to person, place, and time. She appears well-developed and well-nourished.  Blood pressure well controlled Anxious No distress  HENT:  Head: Normocephalic.  Right Ear: External ear normal.  Left Ear: External ear normal.  Mouth/Throat: Oropharynx is clear and moist.  Eyes: Conjunctivae and EOM are normal. Pupils are equal, round, and reactive to light.  Neck: Normal range of motion. Neck supple. No thyromegaly present.  Cardiovascular:  Normal rate, regular rhythm, normal heart sounds and intact distal pulses.   Pulmonary/Chest: Effort normal and breath sounds normal.  Abdominal: Soft. Bowel sounds are normal. She exhibits no mass. There is no tenderness.  Musculoskeletal: Normal range of motion.  Lymphadenopathy:    She has no cervical adenopathy.  Neurological: She is alert and oriented to person, place, and time.  Skin: Skin is warm and dry. No rash noted.  Psychiatric: She has a normal mood and affect. Her behavior is normal.          Assessment & Plan:   Hypertension, well-controlled Anxiety disorder.  Continue when necessary lorazepam  Patient has been asked to check her pulse rate.  If she has any further palpitations.  Otherwise, return in 6 months for follow-up or as needed  EKG reviewed today and was normal

## 2014-07-15 NOTE — Progress Notes (Signed)
Pre visit review using our clinic review tool, if applicable. No additional management support is needed unless otherwise documented below in the visit note. 

## 2014-07-15 NOTE — Patient Instructions (Signed)
Limit your sodium (Salt) intake  Please check your blood pressure on a regular basis.  If it is consistently greater than 150/90, please make an office appointment.  Return in 6 months for follow-up   

## 2014-09-11 ENCOUNTER — Encounter: Payer: Self-pay | Admitting: Internal Medicine

## 2014-09-11 ENCOUNTER — Ambulatory Visit (INDEPENDENT_AMBULATORY_CARE_PROVIDER_SITE_OTHER): Payer: BC Managed Care – PPO | Admitting: Internal Medicine

## 2014-09-11 VITALS — BP 114/60 | HR 68 | Temp 98.0°F | Resp 18 | Ht 62.0 in | Wt 161.0 lb

## 2014-09-11 DIAGNOSIS — F411 Generalized anxiety disorder: Secondary | ICD-10-CM | POA: Diagnosis not present

## 2014-09-11 DIAGNOSIS — I10 Essential (primary) hypertension: Secondary | ICD-10-CM

## 2014-09-11 NOTE — Progress Notes (Signed)
Pre visit review using our clinic review tool, if applicable. No additional management support is needed unless otherwise documented below in the visit note. 

## 2014-09-11 NOTE — Progress Notes (Signed)
Subjective:    Patient ID: Kristy Becker, female    DOB: 1951/04/07, 64 y.o.   MRN: [id number]  HPI  64 year old patient who has essential hypertension.  She also has a history of allergic rhinitis and anxiety disorder.  She has done quite well since her last visit here.  Blood pressure is in a low-normal range.  Her allergy symptoms are manageable.  Her anxiety is also improved with when necessary lorazepam.  In general doing quite well today  Past Medical History  Diagnosis Date  . ALLERGIC RHINITIS 10/06/2007  . ANXIETY 01/31/2007  . BACK PAIN 08/21/2007  . COLONIC POLYPS, HX OF 02/26/2009  . ECZEMA, HANDS 05/08/2007  . HYPERTENSION 01/31/2007    History   Social History  . Marital Status: Married    Spouse Name: N/A  . Number of Children: N/A  . Years of Education: N/A   Occupational History  . Not on file.   Social History Main Topics  . Smoking status: Never Smoker   . Smokeless tobacco: Never Used  . Alcohol Use: No  . Drug Use: No  . Sexual Activity: Not on file   Other Topics Concern  . Not on file   Social History Narrative    Past Surgical History  Procedure Laterality Date  . Tonsillectomy and adenoidectomy      No family history on file.  Allergies  Allergen Reactions  . Cephalosporins Other (See Comments)    unknown  . Clarithromycin Other (See Comments)    unknown  . Doxycycline Hyclate Other (See Comments)  . Penicillins Hives    Current Outpatient Prescriptions on File Prior to Visit  Medication Sig Dispense Refill  . cetirizine HCl (ZYRTEC) 5 MG/5ML SYRP Take 5 mg by mouth daily.    . fluticasone (FLONASE) 50 MCG/ACT nasal spray Place 1 spray into both nostrils daily.    Marland Kitchen lisinopril-hydrochlorothiazide (PRINZIDE,ZESTORETIC) 20-25 MG per tablet Take 1 tablet by mouth daily. 90 tablet 3  . LORazepam (ATIVAN) 0.5 MG tablet Take 1 tablet (0.5 mg total) by mouth 2 (two) times daily as needed. For anxiety. 60 tablet 5  . miconazole  (MICOTIN) 2 % cream Apply 1 application topically at bedtime.    . Multiple Vitamin (MULTIVITAMIN WITH MINERALS) TABS Take 1 tablet by mouth daily.    Marland Kitchen triamcinolone cream (KENALOG) 0.1 %   2   No current facility-administered medications on file prior to visit.    BP 114/60 mmHg  Pulse 68  Temp(Src) 98 F (36.7 C) (Oral)  Resp 18  Ht 5\' 2"  (1.575 m)  Wt 161 lb (73.029 kg)  BMI 29.44 kg/m2  SpO2 98%      Review of Systems  Constitutional: Negative.   HENT: Negative for congestion, dental problem, hearing loss, rhinorrhea, sinus pressure, sore throat and tinnitus.   Eyes: Negative for pain, discharge and visual disturbance.  Respiratory: Negative for cough and shortness of breath.   Cardiovascular: Positive for palpitations. Negative for chest pain and leg swelling.  Gastrointestinal: Negative for nausea, vomiting, abdominal pain, diarrhea, constipation, blood in stool and abdominal distention.  Genitourinary: Negative for dysuria, urgency, frequency, hematuria, flank pain, vaginal bleeding, vaginal discharge, difficulty urinating, vaginal pain and pelvic pain.  Musculoskeletal: Negative for joint swelling, arthralgias and gait problem.  Skin: Negative for rash.  Neurological: Negative for dizziness, syncope, speech difficulty, weakness, numbness and headaches.  Hematological: Negative for adenopathy.  Psychiatric/Behavioral: Negative for behavioral problems, dysphoric mood and agitation. The patient is nervous/anxious.  Objective:   Physical Exam  Constitutional: She is oriented to person, place, and time. She appears well-developed and well-nourished.  HENT:  Head: Normocephalic.  Right Ear: External ear normal.  Left Ear: External ear normal.  Mouth/Throat: Oropharynx is clear and moist.  Eyes: Conjunctivae and EOM are normal. Pupils are equal, round, and reactive to light.  Neck: Normal range of motion. Neck supple. No thyromegaly present.  Cardiovascular:  Normal rate, regular rhythm, normal heart sounds and intact distal pulses.   Pulse slow and regular without ectopics.  No murmurs  Pulmonary/Chest: Effort normal and breath sounds normal.  Abdominal: Soft. Bowel sounds are normal. She exhibits no mass. There is no tenderness.  Musculoskeletal: Normal range of motion.  Lymphadenopathy:    She has no cervical adenopathy.  Neurological: She is alert and oriented to person, place, and time.  Skin: Skin is warm and dry. No rash noted.  Psychiatric: She has a normal mood and affect. Her behavior is normal.          Assessment & Plan:   Hypertension, well-controlled History of palpitations, stable Anxiety disorder, improved  We'll continue present regimen Recheck in the fall Will report any clinical change

## 2014-09-11 NOTE — Patient Instructions (Signed)
Limit your sodium (Salt) intake    It is important that you exercise regularly, at least 20 minutes 3 to 4 times per week.  If you develop chest pain or shortness of breath seek  medical attention.  Return in 6 months for follow-up  

## 2014-09-19 ENCOUNTER — Telehealth: Payer: Self-pay | Admitting: Internal Medicine

## 2014-09-19 NOTE — Telephone Encounter (Signed)
Noted  

## 2014-09-19 NOTE — Telephone Encounter (Signed)
Patient Name: Kristy Becker  DOB: August 07, 1950    Initial Comment [name] states she woke up with stuffy nose, and had nose bleed after blowing   Nurse Assessment  Nurse: Mallie Mussel, RN, Alveta Heimlich Date/Time (Eastern Time): 09/19/2014 1:08:04 PM  Confirm and document reason for call. If symptomatic, describe symptoms. ---[name] states that she has a stuffy nose this morning and blew her nose. There was some bleeding after blowing her nose. She does have allergies and was outside this morning. She blew her nose again this afternoon and it began to bleed again. Neither one these bled for long.  Has the patient traveled out of the country within the last 30 days? ---No  Does the patient require triage? ---Yes  Related visit to physician within the last 2 weeks? ---No  Does the PT have any chronic conditions? (i.e. diabetes, asthma, etc.) ---Yes  List chronic conditions. ---Allergies     Guidelines    Guideline Title Affirmed Question Affirmed Notes  Nosebleed [1] Mild-moderate nosebleed AND [2] bleeding stopped now (all triage questions negative)    Final Disposition User   [place], RN, Alveta Heimlich

## 2014-10-01 ENCOUNTER — Ambulatory Visit: Payer: BC Managed Care – PPO | Admitting: Family Medicine

## 2014-10-04 ENCOUNTER — Encounter: Payer: Self-pay | Admitting: Family Medicine

## 2014-10-04 ENCOUNTER — Ambulatory Visit (INDEPENDENT_AMBULATORY_CARE_PROVIDER_SITE_OTHER): Payer: Medicare PPO | Admitting: Family Medicine

## 2014-10-04 VITALS — BP 120/64 | HR 66 | Temp 98.3°F | Wt 160.0 lb

## 2014-10-04 DIAGNOSIS — J209 Acute bronchitis, unspecified: Secondary | ICD-10-CM

## 2014-10-04 MED ORDER — LEVOFLOXACIN 500 MG PO TABS: 500.0000 mg | ORAL_TABLET | Freq: Every day | ORAL | Status: AC

## 2014-10-04 MED ORDER — HYDROCODONE-HOMATROPINE 5-1.5 MG/5ML PO SYRP: 5.0000 mL | ORAL_SOLUTION | ORAL | Status: DC | PRN

## 2014-10-04 NOTE — Progress Notes (Signed)
   Subjective:    Patient ID: Kristy Becker, female    DOB: [birth year], 64 y.o.   MRN: [id number]  HPI Here for one week of ST, PND, and coughing up yellow sputum. No fever.    Review of Systems  Constitutional: Negative.   HENT: Positive for congestion and postnasal drip. Negative for sinus pressure.   Eyes: Negative.   Respiratory: Positive for cough and chest tightness. Negative for wheezing.        Objective:   Physical Exam  Constitutional: She appears well-developed and well-nourished.  HENT:  Right Ear: External ear normal.  Left Ear: External ear normal.  Nose: Nose normal.  Mouth/Throat: Oropharynx is clear and moist.  Eyes: Conjunctivae are normal.  Pulmonary/Chest: Effort normal. No respiratory distress. She has no wheezes. She has no rales.  Scattered rhonchi  Lymphadenopathy:    She has no cervical adenopathy.          Assessment & Plan:  Bronchitis. Add Mucinex prn

## 2014-10-10 ENCOUNTER — Telehealth: Payer: Self-pay | Admitting: Internal Medicine

## 2014-10-10 ENCOUNTER — Ambulatory Visit (INDEPENDENT_AMBULATORY_CARE_PROVIDER_SITE_OTHER): Payer: Medicare PPO | Admitting: Family Medicine

## 2014-10-10 ENCOUNTER — Encounter: Payer: Self-pay | Admitting: Family Medicine

## 2014-10-10 VITALS — BP 110/72 | HR 72 | Temp 98.6°F | Ht 62.0 in | Wt 160.5 lb

## 2014-10-10 DIAGNOSIS — J988 Other specified respiratory disorders: Secondary | ICD-10-CM | POA: Diagnosis not present

## 2014-10-10 DIAGNOSIS — R110 Nausea: Secondary | ICD-10-CM | POA: Diagnosis not present

## 2014-10-10 DIAGNOSIS — T3695XA Adverse effect of unspecified systemic antibiotic, initial encounter: Secondary | ICD-10-CM | POA: Diagnosis not present

## 2014-10-10 NOTE — Progress Notes (Signed)
Pre visit review using our clinic review tool, if applicable. No additional management support is needed unless otherwise documented below in the visit note. 

## 2014-10-10 NOTE — Progress Notes (Signed)
HPI:  Acute visit for;  Upset stomach/Resp infection/Nausea/Abd pain: -reports is taking levaquin and hydocodone given to her for bronchitis and she reports these are upsetting her stomach -she has completed 5 days of levaquin and wants to stop these medications as report she has no SOB, no fever, and her cough is almost gone -since taking these medication has has mild nausea when takes it, decreased desire for food, a few episodes of cramping abd pain and some gas initially - reports feels better today -denies: constipation, diarrhea, vomiting, persistent abd pain, melena, hematochezia, pain in abd today, SOB, fevers  ROS: See pertinent positives and negatives per HPI.  Past Medical History  Diagnosis Date  . ALLERGIC RHINITIS 10/06/2007  . ANXIETY 01/31/2007  . BACK PAIN 08/21/2007  . COLONIC POLYPS, HX OF 02/26/2009  . ECZEMA, HANDS 05/08/2007  . HYPERTENSION 01/31/2007    Past Surgical History  Procedure Laterality Date  . Tonsillectomy and adenoidectomy      No family history on file.  History   Social History  . Marital Status: Married    Spouse Name: N/A  . Number of Children: N/A  . Years of Education: N/A   Social History Main Topics  . Smoking status: Never Smoker   . Smokeless tobacco: Never Used  . Alcohol Use: No  . Drug Use: No  . Sexual Activity: Not on file   Other Topics Concern  . None   Social History Narrative     Current outpatient prescriptions:  .  cetirizine HCl (ZYRTEC) 5 MG/5ML SYRP, Take 5 mg by mouth daily., Disp: , Rfl:  .  fluticasone (FLONASE) 50 MCG/ACT nasal spray, Place 1 spray into both nostrils daily., Disp: , Rfl:  .  HYDROcodone-homatropine (HYDROMET) 5-1.5 MG/5ML syrup, Take 5 mLs by mouth every 4 (four) hours as needed., Disp: 240 mL, Rfl: 0 .  levofloxacin (LEVAQUIN) 500 MG tablet, Take 1 tablet (500 mg total) by mouth daily., Disp: 10 tablet, Rfl: 0 .  lisinopril-hydrochlorothiazide (PRINZIDE,ZESTORETIC) 20-25 MG per  tablet, Take 1 tablet by mouth daily., Disp: 90 tablet, Rfl: 3 .  LORazepam (ATIVAN) 0.5 MG tablet, Take 1 tablet (0.5 mg total) by mouth 2 (two) times daily as needed. For anxiety., Disp: 60 tablet, Rfl: 5 .  methylcellulose oral powder, Take by mouth daily., Disp: , Rfl:  .  methylcellulose oral powder, Take 1 packet by mouth daily., Disp: , Rfl:  .  miconazole (MICOTIN) 2 % cream, Apply 1 application topically at bedtime., Disp: , Rfl:  .  Multiple Vitamin (MULTIVITAMIN WITH MINERALS) TABS, Take 1 tablet by mouth daily., Disp: , Rfl:  .  triamcinolone cream (KENALOG) 0.1 %, , Disp: , Rfl: 2  EXAM:  Filed Vitals:   10/10/14 1430  BP: 110/72  Pulse: 72  Temp: 98.6 F (37 C)    Body mass index is 29.35 kg/(m^2).  GENERAL: vitals reviewed and listed above, alert, oriented, appears well hydrated and in no acute distress  HEENT: atraumatic, conjunttiva clear, no obvious abnormalities on inspection of external nose and ears  NECK: no obvious masses on inspection  LUNGS: clear to auscultation bilaterally, no wheezes, rales or rhonchi, good air movement  CV: HRRR, no peripheral edema  ABD: BS+, soft, NTTP  MS: moves all extremities without noticeable abnormality  PSYCH: pleasant and cooperative, no obvious depression or anxiety  ASSESSMENT AND PLAN:  Discussed the following assessment and plan:  Adverse reaction to antibiotic, initial encounter Nausea -she is concerned she is having  a side effect to levaquin and or hycodan - this is possible given these are common side effects and she opted to stop these medications. Advised follow up if symptoms persist.  Respiratory infection -her symptoms have resolved, no fevers, no SOB, no findings on exam today, she has had 5 doses of Levaquin that would likely have covered if there was a bacterial or mild pneumonia. Opted to not start another antibiotic. Advised RTC if SOB, fevers, cough recurs, etc.  -Patient advised to return or  notify a doctor immediately if symptoms worsen or persist or new concerns arise.  There are no Patient Instructions on file for this visit.   Colin Benton R.

## 2014-10-10 NOTE — Telephone Encounter (Signed)
Noted  

## 2014-10-10 NOTE — Telephone Encounter (Signed)
Patient Name: Kristy Becker DOB: 12-Jan-1951 Initial Comment [name] states she MD dx her with bronchitis, rx hydrocodone syrup, levofloxacin 500mg . Stomach has been cramping. Nurse Assessment Nurse: Kristy Sa, RN, Kristy Becker Date/Time (Eastern Time): 10/10/2014 10:57:04 AM Confirm and document reason for call. If symptomatic, describe symptoms. ---[name] states she developed abdominal cramping 4 days ago. No diarrhea. No fever. No injury to her abdomen in the past 3 days. Has the patient traveled out of the country within the last 30 days? ---No Does the patient require triage? ---Yes Related visit to physician within the last 2 weeks? ---No Does the PT have any chronic conditions? (i.e. diabetes, asthma, etc.) ---Yes List chronic conditions. ---High Blood Pressure, Allergies, Bronchitis Guidelines Guideline Title Affirmed Question Affirmed Notes Final Disposition User Comments Scheduled 3pm appointment today with Dr. Colin Becker.

## 2014-10-18 ENCOUNTER — Ambulatory Visit (INDEPENDENT_AMBULATORY_CARE_PROVIDER_SITE_OTHER): Payer: Medicare PPO | Admitting: Internal Medicine

## 2014-10-18 ENCOUNTER — Encounter: Payer: Self-pay | Admitting: Internal Medicine

## 2014-10-18 VITALS — BP 140/80 | HR 67 | Temp 98.0°F | Resp 20 | Ht 62.0 in | Wt 163.0 lb

## 2014-10-18 DIAGNOSIS — I10 Essential (primary) hypertension: Secondary | ICD-10-CM | POA: Diagnosis not present

## 2014-10-18 DIAGNOSIS — N760 Acute vaginitis: Secondary | ICD-10-CM

## 2014-10-18 MED ORDER — FLUCONAZOLE 150 MG PO TABS: 150.0000 mg | ORAL_TABLET | Freq: Once | ORAL | Status: DC

## 2014-10-18 NOTE — Patient Instructions (Signed)
Call or return to clinic prn if these symptoms worsen or fail to improve as anticipated.  Monilial Vaginitis Vaginitis in a soreness, swelling and redness (inflammation) of the vagina and vulva. Monilial vaginitis is not a sexually transmitted infection. CAUSES  Yeast vaginitis is caused by yeast (candida) that is normally found in your vagina. With a yeast infection, the candida has overgrown in number to a point that upsets the chemical balance. SYMPTOMS   White, thick vaginal discharge.  Swelling, itching, redness and irritation of the vagina and possibly the lips of the vagina (vulva).  Burning or painful urination.  Painful intercourse. DIAGNOSIS  Things that may contribute to monilial vaginitis are:  Postmenopausal and virginal states.  Pregnancy.  Infections.  Being tired, sick or stressed, especially if you had monilial vaginitis in the past.  Diabetes. Good control will help lower the chance.  Birth control pills.  Tight fitting garments.  Using bubble bath, feminine sprays, douches or deodorant tampons.  Taking certain medications that kill germs (antibiotics).  Sporadic recurrence can occur if you become ill. TREATMENT  Your caregiver will give you medication.  There are several kinds of anti monilial vaginal creams and suppositories specific for monilial vaginitis. For recurrent yeast infections, use a suppository or cream in the vagina 2 times a week, or as directed.  Anti-monilial or steroid cream for the itching or irritation of the vulva may also be used. Get your caregiver's permission.  Painting the vagina with methylene blue solution may help if the monilial cream does not work.  Eating yogurt may help prevent monilial vaginitis. HOME CARE INSTRUCTIONS   Finish all medication as prescribed.  Do not have sex until treatment is completed or after your caregiver tells you it is okay.  Take warm sitz baths.  Do not douche.  Do not use tampons,  especially scented ones.  Wear cotton underwear.  Avoid tight pants and panty hose.  Tell your sexual partner that you have a yeast infection. They should go to their caregiver if they have symptoms such as mild rash or itching.  Your sexual partner should be treated as well if your infection is difficult to eliminate.  Practice safer sex. Use condoms.  Some vaginal medications cause latex condoms to fail. Vaginal medications that harm condoms are:  Cleocin cream.  Butoconazole (Femstat).  Terconazole (Terazol) vaginal suppository.  Miconazole (Monistat) (may be purchased over the counter). SEEK MEDICAL CARE IF:   You have a temperature by mouth above 102 F (38.9 C).  The infection is getting worse after 2 days of treatment.  The infection is not getting better after 3 days of treatment.  You develop blisters in or around your vagina.  You develop vaginal bleeding, and it is not your menstrual period.  You have pain when you urinate.  You develop intestinal problems.  You have pain with sexual intercourse. Document Released: 03/10/2005 Document Revised: 08/23/2011 Document Reviewed: 11/22/2008 [facility] Patient Information 2015 [place], [place]. This information is not intended to replace advice given to you by your health care provider. Make sure you discuss any questions you have with your health care provider.

## 2014-10-18 NOTE — Progress Notes (Signed)
Subjective:    Patient ID: Kristy Becker, female    DOB: Oct 13, 1950, 64 y.o.   MRN: [id number]  HPI   64 year old patient who is seen today in follow-up.  She has been seen recently for an apparent viral URI.  She basically has improved and is off medications.  She has developed a scanty vaginal discharge that she feels is related to her antibiotic use.  She has been using some OTC miconazole with some benefit.  She is having some persistent vaginal discharge with the itch  Past Medical History  Diagnosis Date  . ALLERGIC RHINITIS 10/06/2007  . ANXIETY 01/31/2007  . BACK PAIN 08/21/2007  . COLONIC POLYPS, HX OF 02/26/2009  . ECZEMA, HANDS 05/08/2007  . HYPERTENSION 01/31/2007    History   Social History  . Marital Status: Married    Spouse Name: N/A  . Number of Children: N/A  . Years of Education: N/A   Occupational History  . Not on file.   Social History Main Topics  . Smoking status: Never Smoker   . Smokeless tobacco: Never Used  . Alcohol Use: No  . Drug Use: No  . Sexual Activity: Not on file   Other Topics Concern  . Not on file   Social History Narrative    Past Surgical History  Procedure Laterality Date  . Tonsillectomy and adenoidectomy      No family history on file.  Allergies  Allergen Reactions  . Azithromycin     nausea  . Cephalosporins Other (See Comments)    unknown  . Clarithromycin Other (See Comments)    unknown  . Doxycycline Hyclate Other (See Comments)  . Penicillins Hives    Current Outpatient Prescriptions on File Prior to Visit  Medication Sig Dispense Refill  . cetirizine HCl (ZYRTEC) 5 MG/5ML SYRP Take 5 mg by mouth daily.    . fluticasone (FLONASE) 50 MCG/ACT nasal spray Place 1 spray into both nostrils daily.    Marland Kitchen lisinopril-hydrochlorothiazide (PRINZIDE,ZESTORETIC) 20-25 MG per tablet Take 1 tablet by mouth daily. 90 tablet 3  . LORazepam (ATIVAN) 0.5 MG tablet Take 1 tablet (0.5 mg total) by mouth 2 (two) times daily  as needed. For anxiety. 60 tablet 5  . miconazole (MICOTIN) 2 % cream Apply 1 application topically at bedtime.    . Multiple Vitamin (MULTIVITAMIN WITH MINERALS) TABS Take 1 tablet by mouth daily.    Marland Kitchen triamcinolone cream (KENALOG) 0.1 %   2   No current facility-administered medications on file prior to visit.    BP 140/80 mmHg  Pulse 67  Temp(Src) 98 F (36.7 C) (Oral)  Resp 20  Ht 5\' 2"  (1.575 m)  Wt 163 lb (73.936 kg)  BMI 29.81 kg/m2  SpO2 98%     Review of Systems  Constitutional: Negative.   HENT: Negative for congestion, dental problem, hearing loss, rhinorrhea, sinus pressure, sore throat and tinnitus.   Eyes: Negative for pain, discharge and visual disturbance.  Respiratory: Positive for cough. Negative for shortness of breath.   Cardiovascular: Negative for chest pain, palpitations and leg swelling.  Gastrointestinal: Negative for nausea, vomiting, abdominal pain, diarrhea, constipation, blood in stool and abdominal distention.  Genitourinary: Positive for vaginal discharge and vaginal pain. Negative for dysuria, urgency, frequency, hematuria, flank pain, vaginal bleeding, difficulty urinating and pelvic pain.  Musculoskeletal: Negative for joint swelling, arthralgias and gait problem.  Skin: Negative for rash.  Neurological: Negative for dizziness, syncope, speech difficulty, weakness, numbness and headaches.  Hematological:  Negative for adenopathy.  Psychiatric/Behavioral: Negative for behavioral problems, dysphoric mood and agitation. The patient is not nervous/anxious.        Objective:   Physical Exam  Constitutional: She is oriented to person, place, and time. She appears well-developed and well-nourished.  HENT:  Head: Normocephalic.  Right Ear: External ear normal.  Left Ear: External ear normal.  Mouth/Throat: Oropharynx is clear and moist.  Eyes: Conjunctivae and EOM are normal. Pupils are equal, round, and reactive to light.  Neck: Normal range of  motion. Neck supple. No thyromegaly present.  Cardiovascular: Normal rate, regular rhythm, normal heart sounds and intact distal pulses.   Pulmonary/Chest: Effort normal and breath sounds normal. No respiratory distress. She has no wheezes. She has no rales.  Abdominal: Soft. Bowel sounds are normal. She exhibits no mass. There is no tenderness.  Musculoskeletal: Normal range of motion.  Lymphadenopathy:    She has no cervical adenopathy.  Neurological: She is alert and oriented to person, place, and time.  Skin: Skin is warm and dry. No rash noted.  Psychiatric: She has a normal mood and affect. Her behavior is normal.          Assessment & Plan:   History of bronchitis and recent antibiotic use Antibiotic associated vaginal discharge.  This has been a bit refractory to topical therapy.  Will treat with Diflucan

## 2014-10-18 NOTE — Progress Notes (Signed)
Pre visit review using our clinic review tool, if applicable. No additional management support is needed unless otherwise documented below in the visit note. 

## 2014-11-06 ENCOUNTER — Telehealth: Payer: Self-pay | Admitting: Family Medicine

## 2014-11-06 ENCOUNTER — Telehealth: Payer: Self-pay | Admitting: Internal Medicine

## 2014-11-06 NOTE — Telephone Encounter (Signed)
Left a message for the pt to return my call.  Need to see if she has had her annual mammogram.  If so, when and where.  If not, would she like one.

## 2014-11-06 NOTE — Telephone Encounter (Signed)
Noted  

## 2014-11-06 NOTE — Telephone Encounter (Signed)
Pt returned your call she said she will be having her mammogram in August

## 2014-12-10 ENCOUNTER — Encounter: Payer: Self-pay | Admitting: Internal Medicine

## 2014-12-10 ENCOUNTER — Ambulatory Visit (INDEPENDENT_AMBULATORY_CARE_PROVIDER_SITE_OTHER): Payer: Medicare PPO | Admitting: Internal Medicine

## 2014-12-10 DIAGNOSIS — J3089 Other allergic rhinitis: Secondary | ICD-10-CM | POA: Diagnosis not present

## 2014-12-10 DIAGNOSIS — L259 Unspecified contact dermatitis, unspecified cause: Secondary | ICD-10-CM | POA: Diagnosis not present

## 2014-12-10 DIAGNOSIS — I10 Essential (primary) hypertension: Secondary | ICD-10-CM | POA: Diagnosis not present

## 2014-12-10 MED ORDER — METHYLPREDNISOLONE ACETATE 80 MG/ML IJ SUSP
40.0000 mg | Freq: Once | INTRAMUSCULAR | Status: AC
  Administered 2014-12-10: 40 mg via INTRAMUSCULAR

## 2014-12-10 NOTE — Progress Notes (Signed)
Subjective:    Patient ID: Kristy Becker, female    DOB: 1950-07-21, 64 y.o.   MRN: [id number]  HPI  64 year old patient who has a history of essential hypertension as well as allergic rhinitis.  Maintenance medications includes Zyrtec. She has been doing yard work  most afternoons.  She presents today with swelling over the left elbow area as well as the right upper lateral thigh region.  These areas are very pruritic.  She has been using topical triamcinolone.  No wheezing or pulmonary complaints  Past Medical History  Diagnosis Date  . ALLERGIC RHINITIS 10/06/2007  . ANXIETY 01/31/2007  . BACK PAIN 08/21/2007  . COLONIC POLYPS, HX OF 02/26/2009  . ECZEMA, HANDS 05/08/2007  . HYPERTENSION 01/31/2007    History   Social History  . Marital Status: Married    Spouse Name: N/A  . Number of Children: N/A  . Years of Education: N/A   Occupational History  . Not on file.   Social History Main Topics  . Smoking status: Never Smoker   . Smokeless tobacco: Never Used  . Alcohol Use: No  . Drug Use: No  . Sexual Activity: Not on file   Other Topics Concern  . Not on file   Social History Narrative    Past Surgical History  Procedure Laterality Date  . Tonsillectomy and adenoidectomy      No family history on file.  Allergies  Allergen Reactions  . Azithromycin     nausea  . Cephalosporins Other (See Comments)    unknown  . Clarithromycin Other (See Comments)    unknown  . Doxycycline Hyclate Other (See Comments)  . Penicillins Hives    Current Outpatient Prescriptions on File Prior to Visit  Medication Sig Dispense Refill  . cetirizine HCl (ZYRTEC) 5 MG/5ML SYRP Take 5 mg by mouth daily.    . fluticasone (FLONASE) 50 MCG/ACT nasal spray Place 1 spray into both nostrils daily.    Marland Kitchen lisinopril-hydrochlorothiazide (PRINZIDE,ZESTORETIC) 20-25 MG per tablet Take 1 tablet by mouth daily. 90 tablet 3  . LORazepam (ATIVAN) 0.5 MG tablet Take 1 tablet (0.5 mg total) by  mouth 2 (two) times daily as needed. For anxiety. 60 tablet 5  . miconazole (MICOTIN) 2 % cream Apply 1 application topically at bedtime.    . Multiple Vitamin (MULTIVITAMIN WITH MINERALS) TABS Take 1 tablet by mouth daily.    Marland Kitchen triamcinolone cream (KENALOG) 0.1 %   2   No current facility-administered medications on file prior to visit.    BP 120/60 mmHg  Pulse 73  Temp(Src) 98.4 F (36.9 C) (Oral)  Resp 20  Ht 5\' 2"  (1.575 m)  Wt 162 lb (73.483 kg)  BMI 29.62 kg/m2  SpO2 99%     Review of Systems  Constitutional: Negative.   HENT: Negative for congestion, dental problem, hearing loss, rhinorrhea, sinus pressure, sore throat and tinnitus.   Eyes: Negative for pain, discharge and visual disturbance.  Respiratory: Negative for cough and shortness of breath.   Cardiovascular: Negative for chest pain, palpitations and leg swelling.  Gastrointestinal: Negative for nausea, vomiting, abdominal pain, diarrhea, constipation, blood in stool and abdominal distention.  Genitourinary: Negative for dysuria, urgency, frequency, hematuria, flank pain, vaginal bleeding, vaginal discharge, difficulty urinating, vaginal pain and pelvic pain.  Musculoskeletal: Negative for joint swelling, arthralgias and gait problem.  Skin: Positive for rash.  Neurological: Negative for dizziness, syncope, speech difficulty, weakness, numbness and headaches.  Hematological: Negative for adenopathy.  Psychiatric/Behavioral:  Negative for behavioral problems, dysphoric mood and agitation. The patient is not nervous/anxious.        Objective:   Physical Exam  Constitutional: She appears well-developed and well-nourished. No distress.  Skin:  2.  Small abrasions were noted over the left elbow area with a surrounding area of swelling and pruritus approximately 4 cm 2.   1 cm urticarial lesions noted involving the left lateral upper thigh          Assessment & Plan:   Probable contact dermatitis.  She will  continue oral antihistamines and topical triamcinolone.  Will treat with Depo-Medrol dry 40 Hypertension, well-controlled Allergic rhinitis

## 2014-12-10 NOTE — Progress Notes (Signed)
Pre visit review using our clinic review tool, if applicable. No additional management support is needed unless otherwise documented below in the visit note. 

## 2014-12-10 NOTE — Patient Instructions (Signed)
Call or return to clinic prn if these symptoms worsen or fail to improve as anticipated.

## 2014-12-10 NOTE — Addendum Note (Signed)
Addended by: Marian Sorrow on: 12/10/2014 08:44 AM   Modules accepted: Orders

## 2015-01-13 ENCOUNTER — Encounter: Payer: Self-pay | Admitting: Internal Medicine

## 2015-01-13 ENCOUNTER — Ambulatory Visit (INDEPENDENT_AMBULATORY_CARE_PROVIDER_SITE_OTHER): Payer: Medicare PPO | Admitting: Internal Medicine

## 2015-01-13 VITALS — BP 120/70 | HR 72 | Temp 98.2°F | Resp 20 | Ht 62.0 in | Wt 160.0 lb

## 2015-01-13 DIAGNOSIS — I10 Essential (primary) hypertension: Secondary | ICD-10-CM | POA: Diagnosis not present

## 2015-01-13 DIAGNOSIS — Z86010 Personal history of colonic polyps: Secondary | ICD-10-CM

## 2015-01-13 MED ORDER — LORAZEPAM 0.5 MG PO TABS: 0.5000 mg | ORAL_TABLET | Freq: Two times a day (BID) | ORAL | Status: DC | PRN

## 2015-01-13 NOTE — Progress Notes (Signed)
Pre visit review using our clinic review tool, if applicable. No additional management support is needed unless otherwise documented below in the visit note. 

## 2015-01-13 NOTE — Progress Notes (Signed)
Subjective:    Patient ID: Kristy Becker, female    DOB: [birth year], 64 y.o.   MRN: [id number]  HPI  64 year old patient who is seen today for her biannual follow-up.  She has a history of hypertension which has been well-controlled.  She has anxiety and has been under considerable situational stress due to the poor health of her husband who has Parkinson's disease Otherwise, doing well.  She is attempting to get more help in the home to assist with his ADLs  Past Medical History  Diagnosis Date  . ALLERGIC RHINITIS 10/06/2007  . ANXIETY 01/31/2007  . BACK PAIN 08/21/2007  . COLONIC POLYPS, HX OF 02/26/2009  . ECZEMA, HANDS 05/08/2007  . HYPERTENSION 01/31/2007    History   Social History  . Marital Status: Married    Spouse Name: N/A  . Number of Children: N/A  . Years of Education: N/A   Occupational History  . Not on file.   Social History Main Topics  . Smoking status: Never Smoker   . Smokeless tobacco: Never Used  . Alcohol Use: No  . Drug Use: No  . Sexual Activity: Not on file   Other Topics Concern  . Not on file   Social History Narrative    Past Surgical History  Procedure Laterality Date  . Tonsillectomy and adenoidectomy      No family history on file.  Allergies  Allergen Reactions  . Azithromycin     nausea  . Cephalosporins Other (See Comments)    unknown  . Clarithromycin Other (See Comments)    unknown  . Doxycycline Hyclate Other (See Comments)  . Penicillins Hives    Current Outpatient Prescriptions on File Prior to Visit  Medication Sig Dispense Refill  . cetirizine HCl (ZYRTEC) 5 MG/5ML SYRP Take 5 mg by mouth daily.    . fluticasone (FLONASE) 50 MCG/ACT nasal spray Place 1 spray into both nostrils daily.    Marland Kitchen lisinopril-hydrochlorothiazide (PRINZIDE,ZESTORETIC) 20-25 MG per tablet Take 1 tablet by mouth daily. 90 tablet 3  . LORazepam (ATIVAN) 0.5 MG tablet Take 1 tablet (0.5 mg total) by mouth 2 (two) times daily as needed. For  anxiety. 60 tablet 5  . miconazole (MICOTIN) 2 % cream Apply 1 application topically at bedtime.    . Multiple Vitamin (MULTIVITAMIN WITH MINERALS) TABS Take 1 tablet by mouth daily.    Marland Kitchen triamcinolone cream (KENALOG) 0.1 %   2   No current facility-administered medications on file prior to visit.    BP 120/70 mmHg  Pulse 72  Temp(Src) 98.2 F (36.8 C) (Oral)  Resp 20  Ht 5\' 2"  (1.575 m)  Wt 160 lb (72.576 kg)  BMI 29.26 kg/m2  SpO2 99%      Review of Systems  Constitutional: Negative.   HENT: Negative for congestion, dental problem, hearing loss, rhinorrhea, sinus pressure, sore throat and tinnitus.   Eyes: Negative for pain, discharge and visual disturbance.  Respiratory: Negative for cough and shortness of breath.   Cardiovascular: Negative for chest pain, palpitations and leg swelling.  Gastrointestinal: Negative for nausea, vomiting, abdominal pain, diarrhea, constipation, blood in stool and abdominal distention.  Genitourinary: Negative for dysuria, urgency, frequency, hematuria, flank pain, vaginal bleeding, vaginal discharge, difficulty urinating, vaginal pain and pelvic pain.  Musculoskeletal: Negative for joint swelling, arthralgias and gait problem.  Skin: Negative for rash.  Neurological: Negative for dizziness, syncope, speech difficulty, weakness, numbness and headaches.  Hematological: Negative for adenopathy.  Psychiatric/Behavioral: Negative for behavioral  problems, dysphoric mood and agitation. The patient is nervous/anxious.        Objective:   Physical Exam  Constitutional: She is oriented to person, place, and time. She appears well-developed and well-nourished.  HENT:  Head: Normocephalic.  Right Ear: External ear normal.  Left Ear: External ear normal.  Mouth/Throat: Oropharynx is clear and moist.  Eyes: Conjunctivae and EOM are normal. Pupils are equal, round, and reactive to light.  Neck: Normal range of motion. Neck supple. No thyromegaly  present.  Cardiovascular: Normal rate, regular rhythm, normal heart sounds and intact distal pulses.   Pulmonary/Chest: Effort normal and breath sounds normal.  Abdominal: Soft. Bowel sounds are normal. She exhibits no mass. There is no tenderness.  Musculoskeletal: Normal range of motion.  Lymphadenopathy:    She has no cervical adenopathy.  Neurological: She is alert and oriented to person, place, and time.  Skin: Skin is warm and dry. No rash noted.  Psychiatric: She has a normal mood and affect. Her behavior is normal.          Assessment & Plan:   Hypertension, well-controlled Anxiety disorder Situational stress.  Lorazepam refilled  CPX 6 months

## 2015-01-13 NOTE — Patient Instructions (Signed)
Limit your sodium (Salt) intake    It is important that you exercise regularly, at least 20 minutes 3 to 4 times per week.  If you develop chest pain or shortness of breath seek  medical attention.  Return in 6 months for follow-up  

## 2015-01-15 ENCOUNTER — Other Ambulatory Visit: Payer: Self-pay | Admitting: Obstetrics and Gynecology

## 2015-01-15 DIAGNOSIS — J3089 Other allergic rhinitis: Secondary | ICD-10-CM | POA: Diagnosis not present

## 2015-01-15 DIAGNOSIS — Z1231 Encounter for screening mammogram for malignant neoplasm of breast: Secondary | ICD-10-CM | POA: Diagnosis not present

## 2015-01-15 DIAGNOSIS — Z124 Encounter for screening for malignant neoplasm of cervix: Secondary | ICD-10-CM | POA: Diagnosis not present

## 2015-01-15 DIAGNOSIS — J301 Allergic rhinitis due to pollen: Secondary | ICD-10-CM | POA: Diagnosis not present

## 2015-01-15 DIAGNOSIS — J3081 Allergic rhinitis due to animal (cat) (dog) hair and dander: Secondary | ICD-10-CM | POA: Diagnosis not present

## 2015-01-15 DIAGNOSIS — H1045 Other chronic allergic conjunctivitis: Secondary | ICD-10-CM | POA: Diagnosis not present

## 2015-01-15 LAB — HM MAMMOGRAPHY

## 2015-01-15 LAB — HM PAP SMEAR

## 2015-01-17 LAB — CYTOLOGY - PAP

## 2015-02-03 DIAGNOSIS — L509 Urticaria, unspecified: Secondary | ICD-10-CM | POA: Diagnosis not present

## 2015-02-13 ENCOUNTER — Telehealth: Payer: Self-pay | Admitting: Internal Medicine

## 2015-02-13 NOTE — Telephone Encounter (Signed)
Please see message and advise 

## 2015-02-13 NOTE — Telephone Encounter (Signed)
Spoke to pt, told her Dr.K said, Yes. Okay to decrease the dose to 1 half tablet twice daily maximum dose. Also patient may decrease dosing to take only as needed. Pt verbalized understanding.

## 2015-02-13 NOTE — Telephone Encounter (Signed)
Yes.  Okay to decrease the dose to 1 half tablet twice daily maximum dose.  Also patient may decrease dosing to take only as needed

## 2015-02-13 NOTE — Telephone Encounter (Signed)
Patient Name: Kristy Becker DOB: 10-09-1950 Initial Comment [name] states dr put her on Lorazepam, gets very sleepy, has questions regarding dose Nurse Assessment Nurse: Ronnald Ramp, RN, Miranda Date/Time (Eastern Time): 02/13/2015 9:18:00 AM Confirm and document reason for call. If symptomatic, describe symptoms. ---[name] states she is taking Lorazepam twice a day. When she takes the medication it makes her drowsy and she has concerned about being able to take care of her husband. She wants to know if she can half the dose. She has noticed improvement with the medication and needs both doses, but wants to know if she can take 1/2 a dose at night. Has the patient traveled out of the country within the last 30 days? ---Not Applicable Does the patient require triage? ---No Please document clinical information provided and list any resource used. ---Told caller I would send a message to MD and ask that he follow up with her.

## 2015-04-11 ENCOUNTER — Encounter: Payer: Self-pay | Admitting: Internal Medicine

## 2015-04-11 ENCOUNTER — Ambulatory Visit (INDEPENDENT_AMBULATORY_CARE_PROVIDER_SITE_OTHER): Payer: Medicare PPO | Admitting: Internal Medicine

## 2015-04-11 VITALS — BP 108/62 | HR 67 | Temp 97.8°F | Resp 16 | Ht 61.0 in | Wt 156.0 lb

## 2015-04-11 DIAGNOSIS — E785 Hyperlipidemia, unspecified: Secondary | ICD-10-CM | POA: Diagnosis not present

## 2015-04-11 DIAGNOSIS — Z299 Encounter for prophylactic measures, unspecified: Secondary | ICD-10-CM

## 2015-04-11 DIAGNOSIS — Z860100 Personal history of colon polyps, unspecified: Secondary | ICD-10-CM

## 2015-04-11 DIAGNOSIS — Z23 Encounter for immunization: Secondary | ICD-10-CM | POA: Diagnosis not present

## 2015-04-11 DIAGNOSIS — Z0000 Encounter for general adult medical examination without abnormal findings: Secondary | ICD-10-CM | POA: Diagnosis not present

## 2015-04-11 DIAGNOSIS — I10 Essential (primary) hypertension: Secondary | ICD-10-CM

## 2015-04-11 DIAGNOSIS — J3089 Other allergic rhinitis: Secondary | ICD-10-CM

## 2015-04-11 LAB — CBC WITH DIFFERENTIAL/PLATELET
Basophils Absolute: 0 10*3/uL (ref 0.0–0.1)
Basophils Relative: 0.5 % (ref 0.0–3.0)
Eosinophils Absolute: 0.1 10*3/uL (ref 0.0–0.7)
Eosinophils Relative: 1.6 % (ref 0.0–5.0)
HCT: 40.6 % (ref 36.0–46.0)
Hemoglobin: 13.8 g/dL (ref 12.0–15.0)
Lymphocytes Relative: 32.6 % (ref 12.0–46.0)
Lymphs Abs: 1.7 10*3/uL (ref 0.7–4.0)
MCHC: 34 g/dL (ref 30.0–36.0)
MCV: 88.7 fl (ref 78.0–100.0)
Monocytes Absolute: 0.4 10*3/uL (ref 0.1–1.0)
Monocytes Relative: 8.7 % (ref 3.0–12.0)
Neutro Abs: 2.9 10*3/uL (ref 1.4–7.7)
Neutrophils Relative %: 56.6 % (ref 43.0–77.0)
Platelets: 254 10*3/uL (ref 150.0–400.0)
RBC: 4.58 Mil/uL (ref 3.87–5.11)
RDW: 12.6 % (ref 11.5–15.5)
WBC: 5.1 10*3/uL (ref 4.0–10.5)

## 2015-04-11 LAB — LIPID PANEL
CHOLESTEROL: 198 mg/dL (ref 0–200)
HDL: 59.3 mg/dL (ref >39.00)
LDL Cholesterol: 128 mg/dL — ABNORMAL HIGH (ref 0–99)
NONHDL: 139.19
Total CHOL/HDL Ratio: 3
Triglycerides: 55 mg/dL (ref 0.0–149.0)
VLDL: 11 mg/dL (ref 0.0–40.0)

## 2015-04-11 LAB — TSH: TSH: 0.39 u[IU]/mL (ref 0.35–4.50)

## 2015-04-11 LAB — COMPREHENSIVE METABOLIC PANEL
ALK PHOS: 64 U/L (ref 39–117)
ALT: 17 U/L (ref 0–35)
AST: 14 U/L (ref 0–37)
Albumin: 4.2 g/dL (ref 3.5–5.2)
BUN: 15 mg/dL (ref 6–23)
CO2: 27 meq/L (ref 19–32)
Calcium: 10.1 mg/dL (ref 8.4–10.5)
Chloride: 103 mEq/L (ref 96–112)
Creatinine, Ser: 0.78 mg/dL (ref 0.40–1.20)
GFR: 95.17 mL/min (ref >60.00)
GLUCOSE: 98 mg/dL (ref 70–99)
POTASSIUM: 3.7 meq/L (ref 3.5–5.1)
Sodium: 139 mEq/L (ref 135–145)
Total Bilirubin: 0.5 mg/dL (ref 0.2–1.2)
Total Protein: 8 g/dL (ref 6.0–8.3)

## 2015-04-11 MED ORDER — LISINOPRIL 20 MG PO TABS: 20.0000 mg | ORAL_TABLET | Freq: Every day | ORAL | Status: DC

## 2015-04-11 NOTE — Patient Instructions (Signed)
Limit your sodium (Salt) intake  Please check your blood pressure on a regular basis.  If it is consistently greater than 150/90, please make an office appointment.  Take a calcium supplement, plus 800-1200 units of vitamin D    It is important that you exercise regularly, at least 20 minutes 3 to 4 times per week.  If you develop chest pain or shortness of breath seek  medical attention.  Menopause is a normal process in which your reproductive ability comes to an end. This process happens gradually over a span of months to years, usually between the ages of 48 and 55. Menopause is complete when you have missed 12 consecutive menstrual periods. It is important to talk with your health care provider about some of the most common conditions that affect postmenopausal women, such as heart disease, cancer, and bone loss (osteoporosis). Adopting a healthy lifestyle and getting preventive care can help to promote your health and wellness. Those actions can also lower your chances of developing some of these common conditions. WHAT SHOULD I KNOW ABOUT MENOPAUSE? During menopause, you may experience a number of symptoms, such as:  Moderate-to-severe hot flashes.  Night sweats.  Decrease in sex drive.  Mood swings.  Headaches.  Tiredness.  Irritability.  Memory problems.  Insomnia. Choosing to treat or not to treat menopausal changes is an individual decision that you make with your health care provider. WHAT SHOULD I KNOW ABOUT HORMONE REPLACEMENT THERAPY AND SUPPLEMENTS? Hormone therapy products are effective for treating symptoms that are associated with menopause, such as hot flashes and night sweats. Hormone replacement carries certain risks, especially as you become older. If you are thinking about using estrogen or estrogen with progestin treatments, discuss the benefits and risks with your health care provider. WHAT SHOULD I KNOW ABOUT HEART DISEASE AND STROKE? Heart disease, heart  attack, and stroke become more likely as you age. This may be due, in part, to the hormonal changes that your body experiences during menopause. These can affect how your body processes dietary fats, triglycerides, and cholesterol. Heart attack and stroke are both medical emergencies. There are many things that you can do to help prevent heart disease and stroke:  Have your blood pressure checked at least every 1-2 years. High blood pressure causes heart disease and increases the risk of stroke.  If you are 55-79 years old, ask your health care provider if you should take aspirin to prevent a heart attack or a stroke.  Do not use any tobacco products, including cigarettes, chewing tobacco, or electronic cigarettes. If you need help quitting, ask your health care provider.  It is important to eat a healthy diet and maintain a healthy weight.  Be sure to include plenty of vegetables, fruits, low-fat dairy products, and lean protein.  Avoid eating foods that are high in solid fats, added sugars, or salt (sodium).  Get regular exercise. This is one of the most important things that you can do for your health.  Try to exercise for at least 150 minutes each week. The type of exercise that you do should increase your heart rate and make you sweat. This is known as moderate-intensity exercise.  Try to do strengthening exercises at least twice each week. Do these in addition to the moderate-intensity exercise.  Know your numbers.Ask your health care provider to check your cholesterol and your blood glucose. Continue to have your blood tested as directed by your health care provider. WHAT SHOULD I KNOW ABOUT CANCER SCREENING?   There are several types of cancer. Take the following steps to reduce your risk and to catch any cancer development as early as possible. Breast Cancer  Practice breast self-awareness.  This means understanding how your breasts normally appear and feel.  It also means doing  regular breast self-exams. Let your health care provider know about any changes, no matter how small.  If you are 80 or older, have a clinician do a breast exam (clinical breast exam or CBE) every year. Depending on your age, family history, and medical history, it may be recommended that you also have a yearly breast X-ray (mammogram).  If you have a family history of breast cancer, talk with your health care provider about genetic screening.  If you are at high risk for breast cancer, talk with your health care provider about having an MRI and a mammogram every year.  Breast cancer (BRCA) gene test is recommended for women who have family members with BRCA-related cancers. Results of the assessment will determine the need for genetic counseling and BRCA1 and for BRCA2 testing. BRCA-related cancers include these types:  Breast. This occurs in males or females.  Ovarian.  Tubal. This may also be called fallopian tube cancer.  Cancer of the abdominal or pelvic lining (peritoneal cancer).  Prostate.  Pancreatic. Cervical, Uterine, and Ovarian Cancer Your health care provider may recommend that you be screened regularly for cancer of the pelvic organs. These include your ovaries, uterus, and vagina. This screening involves a pelvic exam, which includes checking for microscopic changes to the surface of your cervix (Pap test).  For women ages 21-65, health care providers may recommend a pelvic exam and a Pap test every three years. For women ages 86-65, they may recommend the Pap test and pelvic exam, combined with testing for human papilloma virus (HPV), every five years. Some types of HPV increase your risk of cervical cancer. Testing for HPV may also be done on women of any age who have unclear Pap test results.  Other health care providers may not recommend any screening for nonpregnant women who are considered low risk for pelvic cancer and have no symptoms. Ask your health care provider  if a screening pelvic exam is right for you.  If you have had past treatment for cervical cancer or a condition that could lead to cancer, you need Pap tests and screening for cancer for at least 20 years after your treatment. If Pap tests have been discontinued for you, your risk factors (such as having a new sexual partner) need to be reassessed to determine if you should start having screenings again. Some women have medical problems that increase the chance of getting cervical cancer. In these cases, your health care provider may recommend that you have screening and Pap tests more often.  If you have a family history of uterine cancer or ovarian cancer, talk with your health care provider about genetic screening.  If you have vaginal bleeding after reaching menopause, tell your health care provider.  There are currently no reliable tests available to screen for ovarian cancer. Lung Cancer Lung cancer screening is recommended for adults 42-87 years old who are at high risk for lung cancer because of a history of smoking. A yearly low-dose CT scan of the lungs is recommended if you:  Currently smoke.  Have a history of at least 30 pack-years of smoking and you currently smoke or have quit within the past 15 years. A pack-year is smoking an average of  one pack of cigarettes per day for one year. Yearly screening should:  Continue until it has been 15 years since you quit.  Stop if you develop a health problem that would prevent you from having lung cancer treatment. Colorectal Cancer  This type of cancer can be detected and can often be prevented.  Routine colorectal cancer screening usually begins at age 61 and continues through age 92.  If you have risk factors for colon cancer, your health care provider may recommend that you be screened at an earlier age.  If you have a family history of colorectal cancer, talk with your health care provider about genetic screening.  Your health  care provider may also recommend using home test kits to check for hidden blood in your stool.  A small camera at the end of a tube can be used to examine your colon directly (sigmoidoscopy or colonoscopy). This is done to check for the earliest forms of colorectal cancer.  Direct examination of the colon should be repeated every 5-10 years until age 54. However, if early forms of precancerous polyps or small growths are found or if you have a family history or genetic risk for colorectal cancer, you may need to be screened more often. Skin Cancer  Check your skin from head to toe regularly.  Monitor any moles. Be sure to tell your health care provider:  About any new moles or changes in moles, especially if there is a change in a mole's shape or color.  If you have a mole that is larger than the size of a pencil eraser.  If any of your family members has a history of skin cancer, especially at a young age, talk with your health care provider about genetic screening.  Always use sunscreen. Apply sunscreen liberally and repeatedly throughout the day.  Whenever you are outside, protect yourself by wearing long sleeves, pants, a wide-brimmed hat, and sunglasses. WHAT SHOULD I KNOW ABOUT OSTEOPOROSIS? Osteoporosis is a condition in which bone destruction happens more quickly than new bone creation. After menopause, you may be at an increased risk for osteoporosis. To help prevent osteoporosis or the bone fractures that can happen because of osteoporosis, the following is recommended:  If you are 52-59 years old, get at least 1,000 mg of calcium and at least 600 mg of vitamin D per day.  If you are older than age 65 but younger than age 36, get at least 1,200 mg of calcium and at least 600 mg of vitamin D per day.  If you are older than age 26, get at least 1,200 mg of calcium and at least 800 mg of vitamin D per day. Smoking and excessive alcohol intake increase the risk of osteoporosis. Eat  foods that are rich in calcium and vitamin D, and do weight-bearing exercises several times each week as directed by your health care provider. WHAT SHOULD I KNOW ABOUT HOW MENOPAUSE AFFECTS [place]? Depression may occur at any age, but it is more common as you become older. Common symptoms of depression include:  Low or sad mood.  Changes in sleep patterns.  Changes in appetite or eating patterns.  Feeling an overall lack of motivation or enjoyment of activities that you previously enjoyed.  Frequent crying spells. Talk with your health care provider if you think that you are experiencing depression. WHAT SHOULD I KNOW ABOUT IMMUNIZATIONS? It is important that you get and maintain your immunizations. These include:  Tetanus, diphtheria, and pertussis (Tdap)  booster vaccine.  Influenza every year before the flu season begins.  Pneumonia vaccine.  Shingles vaccine. Your health care provider may also recommend other immunizations.   This information is not intended to replace advice given to you by your health care provider. Make sure you discuss any questions you have with your health care provider.   Document Released: 07/23/2005 Document Revised: 06/21/2014 Document Reviewed: 01/31/2014 Elsevier Interactive Patient Education [employer].

## 2015-04-11 NOTE — Progress Notes (Signed)
Patient ID: GENICE KIMBERLIN, female   DOB: 1951/03/29, 64 y.o.   MRN: [id number] Patient ID: KERRYANN ALLAIRE, female   DOB: [birth year], 63 y.o.   MRN: [id number]  Subjective:    Patient ID: Rexene Agent, female    DOB: Jun 03, 1951, 64 y.o.   MRN: [id number]  Hypertension Pertinent negatives include no chest pain, headaches, palpitations or shortness of breath.     Wt Readings from Last 3 Encounters:  04/11/15 156 lb (70.761 kg)  01/13/15 160 lb (72.576 kg)  12/10/14 162 lb (73.55 kg)   50  -year-old patient who is seen today for a preventive health examination.  She has been seen by gynecology  and evaluation has included a mammogram and Pap. She is doing quite well. She is exercising regularly and goes to a health club on a regular basis. There's been some modest ongoing weight loss. She feels quite well. Her laboratory studies were reviewed from last year and cholesterol was at her all time low. She has no concerns or complaints today. She has a history of colonic polyps and a strong family history of colon cancer. She had followup colonoscopy in May of 2013. She was free of polyps at that time. She has a history of hypertension which is controlled on diuretic therapy. She has allergic rhinitis mild anxiety and occasional back pain her clinical status has been quite stable. She has been under situational stress due to the health of her husband with PD  Allergies:  1) ! Penicillin G Potassium (Penicillin G Potassium)  2) ! Biaxin (Clarithromycin)  3) ! Doxycycline Hyclate (Doxycycline Hyclate)  4) ! Cephalosporins  5) ! Carbaphen 12 (Phenyleph-Chlorphen-Carbetapen)   Past History:  Past Medical History:   Anxiety  Hypertension  Allergic rhinitis  hand eczema  Colonic polyps, hx of   Past Surgical History:  T&A  Colonoscopy ([facility]) 2008  2013 G[facility])   Family History:   Family History of Colon CA 1st degree relative <60  Family History of Cardiovascular disorder  2  Brothers, one sister with colon CA, Hodgkin's Disease  Father- died colon Ca  5 brothers, one sister, colon ca, Hodgins Disease  Mother-HTN, died CVA   Social History:   Occupation:  Married  Never Smoked  Regular exercise-yes  Medicare wellness visit  1. Risk factors, based on past  M,S,F history.  Current vascular risk factors include hypertension as well as dyslipidemia. 2.  Physical activities:remains active with no regular exercise regimen   3.  Depression/mood:has anxiety disorder but no history of major depression  4.  Hearing:no deficits  5.  ADL's:independent  6.  Fall risk:low  7.  Home safety:no problems identified  8.  Height weight, and visual acuity;height and weight stable no change in visual acuity  9.  Counseling:heart healthy diet more regular exercise encouraged  10. Lab orders based on risk factors:laboratory profile will be reviewed, including TSH and lipid profile  11. Referral :not appropriate at this time 12. Care plan:we'll continue efforts at aggressive risk factor modification.  Flu vaccine administered today.  Prevnar 13 recommended, but she wishes to wait until her next visit 13. Cognitive assessment: alert in order with normal affect.  No cognitive dysfunction 14. Screening: Patient provided with a written and personalized 5-10 year screening schedule in the AVS.  We'll continue annual health examinations with screening lab.  Patient is seen by allergy medicine ophthalmology and OB/GYN  15. Provider List Update: includes primary care medicine OB/GYN ophthalmology and  radiology   Review of Systems  Constitutional: Negative for fever, appetite change, fatigue and unexpected weight change.  HENT: Negative for congestion, dental problem, ear pain, hearing loss, mouth sores, nosebleeds, sinus pressure, sore throat, tinnitus, trouble swallowing and voice change.   Eyes: Negative for photophobia, pain, redness and visual disturbance.  Respiratory:  Negative for cough, chest tightness and shortness of breath.   Cardiovascular: Negative for chest pain, palpitations and leg swelling.  Gastrointestinal: Negative for nausea, vomiting, abdominal pain, diarrhea, constipation, blood in stool, abdominal distention and rectal pain.  Genitourinary: Negative for dysuria, urgency, frequency, hematuria, flank pain, vaginal bleeding, vaginal discharge, difficulty urinating, genital sores, vaginal pain, menstrual problem and pelvic pain.  Musculoskeletal: Negative for back pain, arthralgias and neck stiffness.  Skin: Negative for rash.  Neurological: Negative for dizziness, syncope, speech difficulty, weakness, light-headedness, numbness and headaches.  Hematological: Negative for adenopathy. Does not bruise/bleed easily.  Psychiatric/Behavioral: Negative for suicidal ideas, behavioral problems, self-injury, dysphoric mood and agitation. The patient is not nervous/anxious.        Objective:   Physical Exam  Constitutional: She is oriented to person, place, and time. She appears well-developed and well-nourished.  HENT:  Head: Normocephalic and atraumatic.  Right Ear: External ear normal.  Left Ear: External ear normal.  Mouth/Throat: Oropharynx is clear and moist.  Low hanging soft palate  Eyes: Conjunctivae and EOM are normal.  Neck: Normal range of motion. Neck supple. No JVD present. No thyromegaly present.  Cardiovascular: Normal rate, regular rhythm, normal heart sounds and intact distal pulses.   No murmur heard. Dorsalis pedis pulses full. Posterior tibia pulses faint  Pulmonary/Chest: Effort normal and breath sounds normal. She has no wheezes. She has no rales.  Abdominal: Soft. Bowel sounds are normal. She exhibits no distension and no mass. There is no tenderness. There is no rebound and no guarding.  Musculoskeletal: Normal range of motion. She exhibits no edema or tenderness.  Neurological: She is alert and oriented to person, place,  and time. She has normal reflexes. No cranial nerve deficit. She exhibits normal muscle tone. Coordination normal.  Skin: Skin is warm and dry. No rash noted.  Psychiatric: She has a normal mood and affect. Her behavior is normal.          Assessment & Plan:    Preventive health examination Colonic polyps with strong family history of colon cancer. Followup colonoscopy at five-year intervals Hypertension. Well controlled on diuretic therapy.  Blood pressure readings are often low with some occasional dizziness.  Will discontinue hydrochlorothiazide and continue lisinopril Allergic rhinitis stable.  Follow-up allergy medicine Low salt diet encouraged regular exercise will be continued. She is asked to monitor blood pressure readings at home occasionally. Recheck 1 year

## 2015-04-17 ENCOUNTER — Telehealth: Payer: Self-pay | Admitting: Internal Medicine

## 2015-04-17 NOTE — Telephone Encounter (Signed)
[facility] Day - Client [facility] Patient Name: Kristy Becker DOB: 09-19-1950 Initial Comment [name] states, was taken off Lisenopril hcz because her BP was getting too low and she felt faint Now she is taking Lisenopril , Her BP starts out low, 98/45 and feels a little light headed. At night she gets ankle swelling. Nurse Assessment Nurse: Martyn Ehrich RN, Felicia Date/Time (Eastern Time): 04/17/2015 3:24:36 PM Confirm and document reason for call. If symptomatic, describe symptoms. ---PT was on lisinopril/hctz and the MD took her off of it Friday bc of low BP and today 2:30 pm BP it was 98/45 (at the lowest today). 106/51 at 1 pm. Now BP is 121/56. Not dizzy and not lightheaded at all today. She feels a little out of it - not confused - she wonders if it is allergies. Last week when she saw MD she was woosey - but she is improved. Swelling on inside of her ankles that goes and comes - but not her feet. It is mostly on R foot. She is on new medication lisinopril without hctz. She is usually around 219 on systolic, but not usually this low. Has the patient traveled out of the country within the last 30 days? ---No Does the patient have any new or worsening symptoms? ---Colbert Ewing a triage be completed? ---Yes Related visit to physician within the last 2 weeks? ---Yes Does the PT have any chronic conditions? (i.e. diabetes, asthma, etc.) ---Yes List chronic conditions. ---HTN, year around allergies - runny eyes, congestion (No diagnosis of CHF) Guidelines Guideline Title Affirmed Question Affirmed Notes Low Blood Pressure [7] Fall in systolic BP > 20 mm Hg from normal AND [2] NOT dizzy, lightheaded, or weak (all triage questions negative) Leg Swelling and Edema [1] Thigh, calf, or ankle swelling AND [2] bilateral AND [3] 1 side is more swollen Final Disposition User See Physician within 4 Hours (or PCP triage) Martyn Ehrich, RN,  FeliciaComments orthostatic BP just now sitting 133/58 standing 125/55 caller does not want to be seen. R ankle a little more swollen than left Referrals GO TO FACILITY REFUSED Call Id: [id number] Disagree/Comply Reason: Wait and see

## 2015-04-17 NOTE — Telephone Encounter (Signed)
Please see message and advise 

## 2015-04-18 ENCOUNTER — Ambulatory Visit (INDEPENDENT_AMBULATORY_CARE_PROVIDER_SITE_OTHER): Payer: Medicare PPO | Admitting: Adult Health

## 2015-04-18 ENCOUNTER — Encounter: Payer: Self-pay | Admitting: Adult Health

## 2015-04-18 VITALS — BP 158/80 | Temp 98.3°F | Ht 60.0 in | Wt 164.9 lb

## 2015-04-18 DIAGNOSIS — R600 Localized edema: Secondary | ICD-10-CM

## 2015-04-18 MED ORDER — BENAZEPRIL-HYDROCHLOROTHIAZIDE 20-12.5 MG PO TABS: 1.0000 | ORAL_TABLET | Freq: Every day | ORAL | Status: DC

## 2015-04-18 NOTE — Progress Notes (Addendum)
Subjective:    Patient ID: Kristy Becker, female    DOB: 1950/09/22, 64 y.o.   MRN: [id number]  HPI  64 year old female, patient of Dr. Raliegh Ip. Who presents to the office for bilateral ankle swelling. She was taken off HCTZ seven days ago for hypotension, was kept on lisinopril.  Her blood pressures were in the 76'E systolic. Since going off the HCTZ her ankles have been swelling.   She endorses that her blood pressures have been steadily climbing since discontinuing the HCTZ. This morning her blood pressure was 133/98. In the office today her blood pressure is 158/80.   She is very anxious in the office today.     Review of Systems  Constitutional: Negative.   Eyes: Negative.   Respiratory: Negative.   Cardiovascular: Positive for leg swelling.  Neurological: Negative.  Negative for dizziness, weakness, light-headedness and headaches.  Hematological: Negative.   Psychiatric/Behavioral: The patient is nervous/anxious.    Past Medical History  Diagnosis Date  . ALLERGIC RHINITIS 10/06/2007  . ANXIETY 01/31/2007  . BACK PAIN 08/21/2007  . COLONIC POLYPS, HX OF 02/26/2009  . ECZEMA, HANDS 05/08/2007  . HYPERTENSION 01/31/2007    Social History   Social History  . Marital Status: Married    Spouse Name: N/A  . Number of Children: N/A  . Years of Education: N/A   Occupational History  . Not on file.   Social History Main Topics  . Smoking status: Never Smoker   . Smokeless tobacco: Never Used  . Alcohol Use: No  . Drug Use: No  . Sexual Activity: Not on file   Other Topics Concern  . Not on file   Social History Narrative    Past Surgical History  Procedure Laterality Date  . Tonsillectomy and adenoidectomy      No family history on file.  Allergies  Allergen Reactions  . Azithromycin     nausea  . Cephalosporins Other (See Comments)    unknown  . Clarithromycin Other (See Comments)    unknown  . Doxycycline Hyclate Other (See Comments)  . Penicillins  Hives    Current Outpatient Prescriptions on File Prior to Visit  Medication Sig Dispense Refill  . cetirizine HCl (ZYRTEC) 5 MG/5ML SYRP Take 5 mg by mouth daily.    . fluticasone (FLONASE) 50 MCG/ACT nasal spray Place 1 spray into both nostrils daily.    . hydrOXYzine (ATARAX/VISTARIL) 25 MG tablet Take 25 mg by mouth every 8 (eight) hours.  3  . lisinopril (PRINIVIL,ZESTRIL) 20 MG tablet Take 1 tablet (20 mg total) by mouth daily. 90 tablet 3  . LORazepam (ATIVAN) 0.5 MG tablet Take 1 tablet (0.5 mg total) by mouth 2 (two) times daily as needed. For anxiety. 60 tablet 5  . miconazole (MICOTIN) 2 % cream Apply 1 application topically at bedtime.    . mometasone (ELOCON) 0.1 % cream APPLY TO AFFECTED AREA 2 TIMES DAILY  1  . Multiple Vitamin (MULTIVITAMIN WITH MINERALS) TABS Take 1 tablet by mouth daily.    . pantoprazole (PROTONIX) 40 MG tablet Take 40 mg by mouth daily.    Marland Kitchen triamcinolone cream (KENALOG) 0.1 %   2   No current facility-administered medications on file prior to visit.    BP 158/80 mmHg  Temp(Src) 98.3 F (36.8 C) (Oral)  Ht 5' (1.524 m)  Wt 164 lb 14.4 oz (74.798 kg)  BMI 32.20 kg/m2        Objective:   Physical  Exam  Constitutional: She is oriented to person, place, and time. She appears well-developed and well-nourished. No distress.  Cardiovascular: Normal rate, regular rhythm, normal heart sounds and intact distal pulses.  Exam reveals no gallop and no friction rub.   No murmur heard. Pulmonary/Chest: Effort normal and breath sounds normal. No respiratory distress. She has no wheezes. She has no rales. She exhibits no tenderness.  Musculoskeletal: Normal range of motion. She exhibits edema (non pitting edema ).  Neurological: She is alert and oriented to person, place, and time.  Skin: Skin is warm and dry. No rash noted. She is not diaphoretic. No erythema. No pallor.  Psychiatric: She has a normal mood and affect. Her behavior is normal. Judgment and  thought content normal.  Nursing note and vitals reviewed.  .    Assessment & Plan:   1. Bilateral edema of lower extremity - benazepril-hydrochlorthiazide (LOTENSIN HCT) 20-12.5 MG tablet; Take 1 tablet by mouth daily.  Dispense: 30 tablet; Refill: 3 - Continue to monitor blood pressure at home.  - Stop new medication if BP below 338 systolic  - Follow up on Monday

## 2015-04-18 NOTE — Patient Instructions (Signed)
I have sent in a prescription for Lisinopril 20 and HCTZ 12.5 mg, this is a combination pill.   Continue to monitor your blood pressures at home. If you notice they are becoming below 110/80 then please stop the new medications and just take the lisinopril.   Follow up on Monday with your blood pressure readings.

## 2015-04-18 NOTE — Telephone Encounter (Signed)
FYI

## 2015-04-18 NOTE — Telephone Encounter (Signed)
Noted and Tommi Rumps is aware.

## 2015-04-18 NOTE — Telephone Encounter (Signed)
Tried to contact pt phone number disconnected. Pt has an appt today at 2:30 with [facility].

## 2015-04-18 NOTE — Telephone Encounter (Signed)
Hold lisinopril Continue close home blood pressure monitoring  Resume lisinopril if systolic blood pressure consistently greater than 140

## 2015-04-21 ENCOUNTER — Telehealth: Payer: Self-pay | Admitting: Internal Medicine

## 2015-04-21 NOTE — Telephone Encounter (Signed)
Spoke to pt, told her Tommi Rumps said to stop Lisinopril and take Lotensin HCT 20-12.5 mg tablet and monitor blood pressure if gets too low or other symptoms contact office. Pt verbalized understanding.

## 2015-04-21 NOTE — Telephone Encounter (Signed)
Kristy Becker, please see message and advise. Pt saw you on Friday.

## 2015-04-21 NOTE — Telephone Encounter (Signed)
[facility] [facility] - Client [facility]  Patient Name: Kristy Becker  DOB: 1951/01/08    Initial Comment caller states her ankles are swelling   Nurse Assessment  Nurse: Wynetta Emery, RN, Baker Janus Date/Time Eilene Ghazi Time): 04/21/2015 10:03:24 AM  Confirm and document reason for call. If symptomatic, describe symptoms. ---Marko Plume has swelling on the inside of ankles more swelling -- was taken off the lisinopril/hctz due to bp dropping so low. and changed to lisinopril then ankles started to swell more -- seen by NP on Friday medication changed to benozapril/HCTZ 20mg /12.5mg  daily have not started it she is not sure if she needs to take the medication or stay on the plain lisinopril.  Has the patient traveled out of the country within the last 30 days? ---No  Does the patient have any new or worsening symptoms? ---No  Please document clinical information provided and list any resource used. ---Still taking lisinopril and swelling has gone down on inside of ankle but not on outside of ankles. not as bad .     Guidelines    Guideline Title Affirmed Question Affirmed Notes       Final Disposition User   Clinical Call Wynetta Emery, RN, Baker Janus

## 2015-04-21 NOTE — Telephone Encounter (Signed)
Stop taking the lisinopril and take the LOTENSIN HCT) 20-12.5 MG

## 2015-04-28 ENCOUNTER — Encounter: Payer: Self-pay | Admitting: Adult Health

## 2015-04-28 ENCOUNTER — Ambulatory Visit (INDEPENDENT_AMBULATORY_CARE_PROVIDER_SITE_OTHER): Payer: Medicare PPO | Admitting: Adult Health

## 2015-04-28 VITALS — BP 130/80 | Temp 98.1°F | Ht 60.0 in | Wt 159.4 lb

## 2015-04-28 DIAGNOSIS — I10 Essential (primary) hypertension: Secondary | ICD-10-CM

## 2015-04-28 LAB — BASIC METABOLIC PANEL
BUN: 17 mg/dL (ref 6–23)
CHLORIDE: 104 meq/L (ref 96–112)
CO2: 29 meq/L (ref 19–32)
CREATININE: 0.77 mg/dL (ref 0.40–1.20)
Calcium: 9.6 mg/dL (ref 8.4–10.5)
GFR: 96.58 mL/min (ref >60.00)
Glucose, Bld: 99 mg/dL (ref 70–99)
POTASSIUM: 4 meq/L (ref 3.5–5.1)
Sodium: 140 mEq/L (ref 135–145)

## 2015-04-28 NOTE — Progress Notes (Signed)
Pre visit review using our clinic review tool, if applicable. No additional management support is needed unless otherwise documented below in the visit note. 

## 2015-04-28 NOTE — Patient Instructions (Signed)
It was great seeing you again!  I have a feeling that the bottom number of the blood pressure is low due to where you were placing the cuff. Place the cuff as Kristy Becker showed you.   Follow up with me on Friday so we can recheck your blood pressure.

## 2015-04-28 NOTE — Progress Notes (Signed)
Subjective:    Patient ID: Kristy Becker, female    DOB: 28-Oct-1950, 64 y.o.   MRN: [id number]  HPI  64 year old female who presents to the office today for follow up regarding her blood pressure and lower extremity edema. She was started on Lotensin 20-12.5. Since starting this medication she endorses that her blood pressure has been lower and the edema in her lower extremities no longer have edema. She is concerned that her "bottom number" is low. Her dyastolic has been ranging in the upper 40's - 60's. She has been placing her blood pressure cuff more on her upper forearm.    Denies any dizziness headaches. No syncopal episodes.   Review of Systems  Constitutional: Negative.   HENT: Negative.   Eyes: Negative.   Respiratory: Negative.   Cardiovascular: Negative.   Genitourinary: Negative.   Neurological: Negative.   All other systems reviewed and are negative.  Past Medical History  Diagnosis Date  . ALLERGIC RHINITIS 10/06/2007  . ANXIETY 01/31/2007  . BACK PAIN 08/21/2007  . COLONIC POLYPS, HX OF 02/26/2009  . ECZEMA, HANDS 05/08/2007  . HYPERTENSION 01/31/2007    Social History   Social History  . Marital Status: Married    Spouse Name: N/A  . Number of Children: N/A  . Years of Education: N/A   Occupational History  . Not on file.   Social History Main Topics  . Smoking status: Never Smoker   . Smokeless tobacco: Never Used  . Alcohol Use: No  . Drug Use: No  . Sexual Activity: Not on file   Other Topics Concern  . Not on file   Social History Narrative    Past Surgical History  Procedure Laterality Date  . Tonsillectomy and adenoidectomy      No family history on file.  Allergies  Allergen Reactions  . Azithromycin     nausea  . Cephalosporins Other (See Comments)    unknown  . Clarithromycin Other (See Comments)    unknown  . Doxycycline Hyclate Other (See Comments)  . Penicillins Hives    Current Outpatient Prescriptions on File Prior  to Visit  Medication Sig Dispense Refill  . benazepril-hydrochlorthiazide (LOTENSIN HCT) 20-12.5 MG tablet Take 1 tablet by mouth daily. 30 tablet 3  . cetirizine HCl (ZYRTEC) 5 MG/5ML SYRP Take 5 mg by mouth daily.    . fluticasone (FLONASE) 50 MCG/ACT nasal spray Place 1 spray into both nostrils daily.    . hydrOXYzine (ATARAX/VISTARIL) 25 MG tablet Take 25 mg by mouth every 8 (eight) hours.  3  . lisinopril (PRINIVIL,ZESTRIL) 20 MG tablet Take 1 tablet (20 mg total) by mouth daily. 90 tablet 3  . LORazepam (ATIVAN) 0.5 MG tablet Take 1 tablet (0.5 mg total) by mouth 2 (two) times daily as needed. For anxiety. 60 tablet 5  . miconazole (MICOTIN) 2 % cream Apply 1 application topically at bedtime.    . mometasone (ELOCON) 0.1 % cream APPLY TO AFFECTED AREA 2 TIMES DAILY  1  . Multiple Vitamin (MULTIVITAMIN WITH MINERALS) TABS Take 1 tablet by mouth daily.    . pantoprazole (PROTONIX) 40 MG tablet Take 40 mg by mouth daily.    Marland Kitchen triamcinolone cream (KENALOG) 0.1 %   2   No current facility-administered medications on file prior to visit.    BP 130/80 mmHg  Temp(Src) 98.1 F (36.7 C) (Oral)  Ht 5' (1.524 m)  Wt 159 lb 6.4 oz (72.303 kg)  BMI 31.13  kg/m2       Objective:   Physical Exam  Constitutional: She is oriented to person, place, and time. She appears well-developed and well-nourished. No distress.  Cardiovascular: Normal rate, regular rhythm, normal heart sounds and intact distal pulses.  Exam reveals no gallop and no friction rub.   No murmur heard. Pulmonary/Chest: Effort normal and breath sounds normal. No respiratory distress. She has no wheezes. She has no rales. She exhibits no tenderness.  Musculoskeletal: Normal range of motion. She exhibits edema (trace in lower extremities). She exhibits no tenderness.  Lymphadenopathy:    She has no cervical adenopathy.  Neurological: She is alert and oriented to person, place, and time.  Skin: Skin is warm and dry. No rash  noted. She is not diaphoretic. No erythema. No pallor.  Psychiatric: She has a normal mood and affect. Her behavior is normal. Judgment and thought content normal.  Nursing note and vitals reviewed.      Assessment & Plan:  1. Essential hypertension - low dystolic likely due to placement of BP cuff - Continue with current dose of medication - Basic metabolic panel - Follow up in office in 4 days.

## 2015-04-29 DIAGNOSIS — H1045 Other chronic allergic conjunctivitis: Secondary | ICD-10-CM | POA: Diagnosis not present

## 2015-04-29 DIAGNOSIS — J3081 Allergic rhinitis due to animal (cat) (dog) hair and dander: Secondary | ICD-10-CM | POA: Diagnosis not present

## 2015-04-29 DIAGNOSIS — J301 Allergic rhinitis due to pollen: Secondary | ICD-10-CM | POA: Diagnosis not present

## 2015-04-29 DIAGNOSIS — J3089 Other allergic rhinitis: Secondary | ICD-10-CM | POA: Diagnosis not present

## 2015-05-02 ENCOUNTER — Encounter: Payer: Self-pay | Admitting: Adult Health

## 2015-05-02 ENCOUNTER — Ambulatory Visit (INDEPENDENT_AMBULATORY_CARE_PROVIDER_SITE_OTHER): Payer: Medicare PPO | Admitting: Adult Health

## 2015-05-02 VITALS — BP 120/60 | Temp 98.2°F | Ht 60.0 in | Wt 158.2 lb

## 2015-05-02 DIAGNOSIS — I10 Essential (primary) hypertension: Secondary | ICD-10-CM

## 2015-05-02 NOTE — Progress Notes (Signed)
Pre visit review using our clinic review tool, if applicable. No additional management support is needed unless otherwise documented below in the visit note. 

## 2015-05-02 NOTE — Progress Notes (Signed)
Subjective:    Patient ID: Kristy Becker, female    DOB: 04/25/1951, 64 y.o.   MRN: [id number]  HPI  64 year old female who presents to the office today for follow up regarding her blood pressure. I saw here at the beginning of the week and she was concerned for low diastolic blood pressure readings. It was learned that she was using her cuff wrong at home. She was taught the proper way to use it and was asked to return to the clinic with new readings.   On return her diastolic readings are much better in the 70-80's. Her systolic continues to be in the 110'-120's.   She has no other complaints.   Review of Systems  Constitutional: Negative.   Respiratory: Negative.   Cardiovascular: Negative.   Neurological: Negative.   All other systems reviewed and are negative.  Past Medical History  Diagnosis Date  . ALLERGIC RHINITIS 10/06/2007  . ANXIETY 01/31/2007  . BACK PAIN 08/21/2007  . COLONIC POLYPS, HX OF 02/26/2009  . ECZEMA, HANDS 05/08/2007  . HYPERTENSION 01/31/2007    Social History   Social History  . Marital Status: Married    Spouse Name: N/A  . Number of Children: N/A  . Years of Education: N/A   Occupational History  . Not on file.   Social History Main Topics  . Smoking status: Never Smoker   . Smokeless tobacco: Never Used  . Alcohol Use: No  . Drug Use: No  . Sexual Activity: Not on file   Other Topics Concern  . Not on file   Social History Narrative    Past Surgical History  Procedure Laterality Date  . Tonsillectomy and adenoidectomy      No family history on file.  Allergies  Allergen Reactions  . Azithromycin     nausea  . Cephalosporins Other (See Comments)    unknown  . Clarithromycin Other (See Comments)    unknown  . Doxycycline Hyclate Other (See Comments)  . Penicillins Hives    Current Outpatient Prescriptions on File Prior to Visit  Medication Sig Dispense Refill  . benazepril-hydrochlorthiazide (LOTENSIN HCT) 20-12.5 MG  tablet Take 1 tablet by mouth daily. 30 tablet 3  . cetirizine HCl (ZYRTEC) 5 MG/5ML SYRP Take 5 mg by mouth daily.    . fluticasone (FLONASE) 50 MCG/ACT nasal spray Place 1 spray into both nostrils daily.    . hydrOXYzine (ATARAX/VISTARIL) 25 MG tablet Take 25 mg by mouth every 8 (eight) hours.  3  . lisinopril (PRINIVIL,ZESTRIL) 20 MG tablet Take 1 tablet (20 mg total) by mouth daily. 90 tablet 3  . LORazepam (ATIVAN) 0.5 MG tablet Take 1 tablet (0.5 mg total) by mouth 2 (two) times daily as needed. For anxiety. 60 tablet 5  . miconazole (MICOTIN) 2 % cream Apply 1 application topically at bedtime.    . mometasone (ELOCON) 0.1 % cream APPLY TO AFFECTED AREA 2 TIMES DAILY  1  . Multiple Vitamin (MULTIVITAMIN WITH MINERALS) TABS Take 1 tablet by mouth daily.    . pantoprazole (PROTONIX) 40 MG tablet Take 40 mg by mouth daily.    Marland Kitchen triamcinolone cream (KENALOG) 0.1 %   2   No current facility-administered medications on file prior to visit.    BP 120/60 mmHg  Temp(Src) 98.2 F (36.8 C) (Oral)  Ht 5' (1.524 m)  Wt 158 lb 3.2 oz (71.759 kg)  BMI 30.90 kg/m2       Objective:  Physical Exam  Constitutional: She is oriented to person, place, and time. She appears well-developed and well-nourished. No distress.  Cardiovascular: Normal rate, regular rhythm, normal heart sounds and intact distal pulses.  Exam reveals no gallop and no friction rub.   No murmur heard. Pulmonary/Chest: Effort normal and breath sounds normal. No respiratory distress. She has no wheezes. She has no rales. She exhibits no tenderness.  Neurological: She is alert and oriented to person, place, and time.  Skin: Skin is warm and dry. No rash noted. She is not diaphoretic. No erythema. No pallor.  Psychiatric: She has a normal mood and affect. Her behavior is normal. Judgment and thought content normal.  Nursing note and vitals reviewed.      Assessment & Plan:  1. Essential hypertension - Continue to monitor at  home and follow up with abnormal readings.

## 2015-05-05 DIAGNOSIS — K219 Gastro-esophageal reflux disease without esophagitis: Secondary | ICD-10-CM | POA: Diagnosis not present

## 2015-05-05 DIAGNOSIS — R143 Flatulence: Secondary | ICD-10-CM | POA: Diagnosis not present

## 2015-05-28 ENCOUNTER — Ambulatory Visit (INDEPENDENT_AMBULATORY_CARE_PROVIDER_SITE_OTHER): Payer: Medicare PPO | Admitting: *Deleted

## 2015-05-28 DIAGNOSIS — Z23 Encounter for immunization: Secondary | ICD-10-CM | POA: Diagnosis not present

## 2015-06-11 ENCOUNTER — Other Ambulatory Visit: Payer: Self-pay | Admitting: Internal Medicine

## 2015-07-14 ENCOUNTER — Other Ambulatory Visit: Payer: Self-pay | Admitting: Internal Medicine

## 2015-07-22 ENCOUNTER — Ambulatory Visit (INDEPENDENT_AMBULATORY_CARE_PROVIDER_SITE_OTHER): Payer: Medicare Other | Admitting: Internal Medicine

## 2015-07-22 ENCOUNTER — Encounter: Payer: Self-pay | Admitting: Internal Medicine

## 2015-07-22 VITALS — BP 138/70 | HR 77 | Temp 98.1°F | Resp 20 | Ht 60.0 in | Wt 161.0 lb

## 2015-07-22 DIAGNOSIS — R002 Palpitations: Secondary | ICD-10-CM

## 2015-07-22 DIAGNOSIS — F411 Generalized anxiety disorder: Secondary | ICD-10-CM

## 2015-07-22 DIAGNOSIS — I10 Essential (primary) hypertension: Secondary | ICD-10-CM | POA: Diagnosis not present

## 2015-07-22 NOTE — Progress Notes (Signed)
Subjective:    Patient ID: Kristy Becker, female    DOB: [birth year], 65 y.o.   MRN: [id number]  HPI  65 year old patient who has essential hypertension.  The past few days she has noticed some palpitations.  She describes a rapid regular heart rate that lasted a few seconds.  This occurs once or twice daily.  She states that her caffeine load is very low, although she does use green tea.  She feels that this is decaffeinated.  However, she does admit to some increase in situational stress due to the health of her husband who has Parkinson's disease.  More recently she has begun taking lorazepam more regularly twice daily.  She feels that this medication has perhaps caused her increased palpitations  EKG reviewed today revealed a normal sinus rhythm without ectopics.  EKG revealed some poor R-wave progression, otherwise normal  Past Medical History  Diagnosis Date  . ALLERGIC RHINITIS 10/06/2007  . ANXIETY 01/31/2007  . BACK PAIN 08/21/2007  . COLONIC POLYPS, HX OF 02/26/2009  . ECZEMA, HANDS 05/08/2007  . HYPERTENSION 01/31/2007    Social History   Social History  . Marital Status: Married    Spouse Name: N/A  . Number of Children: N/A  . Years of Education: N/A   Occupational History  . Not on file.   Social History Main Topics  . Smoking status: Never Smoker   . Smokeless tobacco: Never Used  . Alcohol Use: No  . Drug Use: No  . Sexual Activity: Not on file   Other Topics Concern  . Not on file   Social History Narrative    Past Surgical History  Procedure Laterality Date  . Tonsillectomy and adenoidectomy      No family history on file.  Allergies  Allergen Reactions  . Azithromycin     nausea  . Cephalosporins Other (See Comments)    unknown  . Clarithromycin Other (See Comments)    unknown  . Doxycycline Hyclate Other (See Comments)  . Penicillins Hives    Current Outpatient Prescriptions on File Prior to Visit  Medication Sig Dispense Refill  .  benazepril-hydrochlorthiazide (LOTENSIN HCT) 20-12.5 MG tablet Take 1 tablet by mouth daily. 30 tablet 3  . hydrOXYzine (ATARAX/VISTARIL) 25 MG tablet Take 25 mg by mouth every 8 (eight) hours.  3  . LORazepam (ATIVAN) 0.5 MG tablet TAKE 1 TABLET BY MOUTH TWICE A DAY AS NEEDED FOR ANXIETY 60 tablet 2  . miconazole (MICOTIN) 2 % cream Apply 1 application topically at bedtime.    . mometasone (ELOCON) 0.1 % cream APPLY TO AFFECTED AREA 2 TIMES DAILY  1  . Multiple Vitamin (MULTIVITAMIN WITH MINERALS) TABS Take 1 tablet by mouth daily.    . pantoprazole (PROTONIX) 40 MG tablet Take 40 mg by mouth daily.    Marland Kitchen triamcinolone cream (KENALOG) 0.1 %   2   No current facility-administered medications on file prior to visit.    BP 138/70 mmHg  Pulse 77  Temp(Src) 98.1 F (36.7 C) (Oral)  Resp 20  Ht 5' (1.524 m)  Wt 161 lb (73.029 kg)  BMI 31.44 kg/m2  SpO2 98%     Review of Systems  Constitutional: Negative.   HENT: Negative for congestion, dental problem, hearing loss, rhinorrhea, sinus pressure, sore throat and tinnitus.   Eyes: Negative for pain, discharge and visual disturbance.  Respiratory: Negative for cough and shortness of breath.   Cardiovascular: Positive for palpitations. Negative for chest pain and leg  swelling.  Gastrointestinal: Negative for nausea, vomiting, abdominal pain, diarrhea, constipation, blood in stool and abdominal distention.  Genitourinary: Negative for dysuria, urgency, frequency, hematuria, flank pain, vaginal bleeding, vaginal discharge, difficulty urinating, vaginal pain and pelvic pain.  Musculoskeletal: Negative for joint swelling, arthralgias and gait problem.  Skin: Negative for rash.  Neurological: Negative for dizziness, syncope, speech difficulty, weakness, numbness and headaches.  Hematological: Negative for adenopathy.  Psychiatric/Behavioral: Negative for behavioral problems, dysphoric mood and agitation. The patient is not nervous/anxious.         Objective:   Physical Exam  Constitutional: She is oriented to person, place, and time. She appears well-developed and well-nourished.  HENT:  Head: Normocephalic.  Right Ear: External ear normal.  Left Ear: External ear normal.  Mouth/Throat: Oropharynx is clear and moist.  Eyes: Conjunctivae and EOM are normal. Pupils are equal, round, and reactive to light.  Neck: Normal range of motion. Neck supple. No thyromegaly present.  Cardiovascular: Normal rate, regular rhythm, normal heart sounds and intact distal pulses.   Pulmonary/Chest: Effort normal and breath sounds normal.  Abdominal: Soft. Bowel sounds are normal. She exhibits no mass. There is no tenderness.  Musculoskeletal: Normal range of motion.  Lymphadenopathy:    She has no cervical adenopathy.  Neurological: She is alert and oriented to person, place, and time.  Skin: Skin is warm and dry. No rash noted.  Psychiatric: She has a normal mood and affect. Her behavior is normal.          Assessment & Plan:   Palpitations.  Indications discussed at length.  Her blood pressure is well controlled today.  A palpitations.  Intensify my considerations which to beta blocker therapy Anxiety disorder Essential hypertension, well-controlled  No change in therapy

## 2015-07-22 NOTE — Patient Instructions (Signed)
Limit your sodium (Salt) intake  Please check your blood pressure on a regular basis.  If it is consistently greater than 150/90, please make an office appointment.  Call or return to clinic prn if these symptoms worsen or fail to improve as anticipated. Palpitations A palpitation is the feeling that your heartbeat is irregular or is faster than normal. It may feel like your heart is fluttering or skipping a beat. Palpitations are usually not a serious problem. However, in some cases, you may need further medical evaluation. CAUSES  Palpitations can be caused by:  Smoking.  Caffeine or other stimulants, such as diet pills or energy drinks.  Alcohol.  Stress and anxiety.  Strenuous physical activity.  Fatigue.  Certain medicines.  Heart disease, especially if you have a history of irregular heart rhythms (arrhythmias), such as atrial fibrillation, atrial flutter, or supraventricular tachycardia.  An improperly working pacemaker or defibrillator. DIAGNOSIS  To find the cause of your palpitations, your health care provider will take your medical history and perform a physical exam. Your health care provider may also have you take a test called an ambulatory electrocardiogram (ECG). An ECG records your heartbeat patterns over a 24-hour period. You may also have other tests, such as:  Transthoracic echocardiogram (TTE). During echocardiography, sound waves are used to evaluate how blood flows through your heart.  Transesophageal echocardiogram (TEE).  Cardiac monitoring. This allows your health care provider to monitor your heart rate and rhythm in real time.  Holter monitor. This is a portable device that records your heartbeat and can help diagnose heart arrhythmias. It allows your health care provider to track your heart activity for several days, if needed.  Stress tests by exercise or by giving medicine that makes the heart beat faster. TREATMENT  Treatment of palpitations  depends on the cause of your symptoms and can vary greatly. Most cases of palpitations do not require any treatment other than time, relaxation, and monitoring your symptoms. Other causes, such as atrial fibrillation, atrial flutter, or supraventricular tachycardia, usually require further treatment. HOME CARE INSTRUCTIONS   Avoid:  Caffeinated coffee, tea, soft drinks, diet pills, and energy drinks.  Chocolate.  Alcohol.  Stop smoking if you smoke.  Reduce your stress and anxiety. Things that can help you relax include:  A method of controlling things in your body, such as your heartbeats, with your mind (biofeedback).  Yoga.  Meditation.  Physical activity such as swimming, jogging, or walking.  Get plenty of rest and sleep. SEEK MEDICAL CARE IF:   You continue to have a fast or irregular heartbeat beyond 24 hours.  Your palpitations occur more often. SEEK IMMEDIATE MEDICAL CARE IF:  You have chest pain or shortness of breath.  You have a severe headache.  You feel dizzy or you faint. MAKE SURE YOU:  Understand these instructions.  Will watch your condition.  Will get help right away if you are not doing well or get worse.   This information is not intended to replace advice given to you by your health care provider. Make sure you discuss any questions you have with your health care provider.   Document Released: 05/28/2000 Document Revised: 06/05/2013 Document Reviewed: 07/30/2011 Elsevier Interactive Patient Education [employer].

## 2015-07-22 NOTE — Progress Notes (Signed)
Pre visit review using our clinic review tool, if applicable. No additional management support is needed unless otherwise documented below in the visit note. 

## 2015-08-05 ENCOUNTER — Telehealth: Payer: Self-pay | Admitting: Internal Medicine

## 2015-08-05 NOTE — Telephone Encounter (Signed)
Pt said she is taking a over the counter gas x and is asking if it is ok to take with her LORazepam (ATIVAN) 0.5 MG tablet.

## 2015-08-05 NOTE — Telephone Encounter (Signed)
Spoke to pt, told her okay to take Gas X as prescribed but do not take same time as Lorazepam. Pt verbalized understanding.

## 2015-08-11 ENCOUNTER — Other Ambulatory Visit: Payer: Self-pay | Admitting: Adult Health

## 2015-10-20 ENCOUNTER — Ambulatory Visit: Payer: Medicare Other | Admitting: Internal Medicine

## 2015-10-28 ENCOUNTER — Other Ambulatory Visit (INDEPENDENT_AMBULATORY_CARE_PROVIDER_SITE_OTHER): Payer: Medicare Other

## 2015-10-28 DIAGNOSIS — Z0000 Encounter for general adult medical examination without abnormal findings: Secondary | ICD-10-CM | POA: Diagnosis not present

## 2015-10-28 LAB — BASIC METABOLIC PANEL
BUN: 15 mg/dL (ref 6–23)
CALCIUM: 9.5 mg/dL (ref 8.4–10.5)
CO2: 27 mEq/L (ref 19–32)
CREATININE: 0.7 mg/dL (ref 0.40–1.20)
Chloride: 104 mEq/L (ref 96–112)
GFR: 107.64 mL/min (ref >60.00)
GLUCOSE: 85 mg/dL (ref 70–99)
Potassium: 4 mEq/L (ref 3.5–5.1)
SODIUM: 140 meq/L (ref 135–145)

## 2015-10-28 LAB — LIPID PANEL
CHOL/HDL RATIO: 3
CHOLESTEROL: 189 mg/dL (ref 0–200)
HDL: 56.1 mg/dL (ref >39.00)
LDL Cholesterol: 120 mg/dL — ABNORMAL HIGH (ref 0–99)
NonHDL: 132.69
TRIGLYCERIDES: 64 mg/dL (ref 0.0–149.0)
VLDL: 12.8 mg/dL (ref 0.0–40.0)

## 2015-10-28 LAB — CBC WITH DIFFERENTIAL/PLATELET
BASOS ABS: 0 10*3/uL (ref 0.0–0.1)
Basophils Relative: 0.6 % (ref 0.0–3.0)
EOS ABS: 0.2 10*3/uL (ref 0.0–0.7)
Eosinophils Relative: 2.7 % (ref 0.0–5.0)
HCT: 40.7 % (ref 36.0–46.0)
Hemoglobin: 13.6 g/dL (ref 12.0–15.0)
LYMPHS ABS: 1.6 10*3/uL (ref 0.7–4.0)
Lymphocytes Relative: 27.8 % (ref 12.0–46.0)
MCHC: 33.3 g/dL (ref 30.0–36.0)
MCV: 89.4 fl (ref 78.0–100.0)
MONO ABS: 0.5 10*3/uL (ref 0.1–1.0)
MONOS PCT: 8.1 % (ref 3.0–12.0)
NEUTROS PCT: 60.8 % (ref 43.0–77.0)
Neutro Abs: 3.5 10*3/uL (ref 1.4–7.7)
Platelets: 229 10*3/uL (ref 150.0–400.0)
RBC: 4.55 Mil/uL (ref 3.87–5.11)
RDW: 13 % (ref 11.5–15.5)
WBC: 5.8 10*3/uL (ref 4.0–10.5)

## 2015-10-28 LAB — POC URINALSYSI DIPSTICK (AUTOMATED)
Bilirubin, UA: NEGATIVE
Glucose, UA: NEGATIVE
Ketones, UA: NEGATIVE
LEUKOCYTES UA: NEGATIVE
NITRITE UA: NEGATIVE
PH UA: 6
Protein, UA: NEGATIVE
Spec Grav, UA: 1.02
UROBILINOGEN UA: 0.2

## 2015-10-28 LAB — HEPATIC FUNCTION PANEL
ALK PHOS: 65 U/L (ref 39–117)
ALT: 19 U/L (ref 0–35)
AST: 14 U/L (ref 0–37)
Albumin: 4.2 g/dL (ref 3.5–5.2)
BILIRUBIN DIRECT: 0.1 mg/dL (ref 0.0–0.3)
BILIRUBIN TOTAL: 0.5 mg/dL (ref 0.2–1.2)
Total Protein: 7.2 g/dL (ref 6.0–8.3)

## 2015-10-28 LAB — TSH: TSH: 0.26 u[IU]/mL — ABNORMAL LOW (ref 0.35–4.50)

## 2015-11-04 ENCOUNTER — Encounter: Payer: Self-pay | Admitting: Internal Medicine

## 2015-11-04 ENCOUNTER — Ambulatory Visit (INDEPENDENT_AMBULATORY_CARE_PROVIDER_SITE_OTHER): Payer: Medicare Other | Admitting: Internal Medicine

## 2015-11-04 VITALS — BP 130/80 | HR 73 | Temp 98.2°F | Resp 20 | Ht 61.5 in | Wt 163.0 lb

## 2015-11-04 DIAGNOSIS — E2839 Other primary ovarian failure: Secondary | ICD-10-CM

## 2015-11-04 DIAGNOSIS — J302 Other seasonal allergic rhinitis: Secondary | ICD-10-CM

## 2015-11-04 DIAGNOSIS — Z86010 Personal history of colonic polyps: Secondary | ICD-10-CM

## 2015-11-04 DIAGNOSIS — Z0000 Encounter for general adult medical examination without abnormal findings: Secondary | ICD-10-CM

## 2015-11-04 DIAGNOSIS — I10 Essential (primary) hypertension: Secondary | ICD-10-CM | POA: Diagnosis not present

## 2015-11-04 NOTE — Progress Notes (Signed)
Patient ID: AGNIESZKA SCIARRA, female   DOB: March 08, 1951, 65 y.o.   MRN: [id number] Patient ID: EUTHA CORRADO, female   DOB: 12/04/1950, 65 y.o.   MRN: [id number]  Subjective:    Patient ID: Rexene Agent, female    DOB: 23-Dec-1950, 65 y.o.   MRN: [id number]  Hypertension Pertinent negatives include no chest pain, headaches, palpitations or shortness of breath.     Wt Readings from Last 3 Encounters:  11/04/15 163 lb (73.936 kg)  07/22/15 161 lb (73.029 kg)  05/02/15 158 lb 3.2 oz (71.89 kg)   65 -year-old patient who is seen today for a preventive health examination.  She is   seen by gynecology.  She is doing quite well. She is exercising regularly and goes to a health club on a regular basis. There's been some modest ongoing weight loss. She feels quite well. Her laboratory studies were reviewed from last year and cholesterol was at her all time low. She has no concerns or complaints today. She has a history of colonic polyps and a strong family history of colon cancer. She had followup colonoscopy in May of 2013. She was free of polyps at that time and is scheduled for followup in 5 years. She has a history of hypertension which is controlled on diuretic therapy. She has allergic rhinitis mild anxiety and occasional back pain her clinical status has been quite stable. She has been under situational stress due to the health of her husband with PD  Allergies:  1) ! Penicillin G Potassium (Penicillin G Potassium)  2) ! Biaxin (Clarithromycin)  3) ! Doxycycline Hyclate (Doxycycline Hyclate)  4) ! Cephalosporins  5) ! Carbaphen 12 (Phenyleph-Chlorphen-Carbetapen)   Past History:  Past Medical History:   Anxiety  Hypertension  Allergic rhinitis  hand eczema  Colonic polyps, hx of   Past Surgical History:  T&A  Colonoscopy ([facility]) 2008  2013 G[facility])   Family History:   Family History of Colon CA 1st degree relative <60  Family History of Cardiovascular disorder  2  Brothers, one sister with colon CA, Hodgkin's Disease  Father- died colon Ca  5 brothers, one sister, colon ca, Hodgins Disease  Mother-HTN, died CVA   Social History:   Occupation:  Married  Never Smoked  Regular exercise-yes  Medicare wellness visit  1. Risk factors, based on past  M,S,F history .  Current vascular risk factors include essential hypertension  2.  Physical activities: Remains quite active.  Enjoys [employer], drilling as well as gardening  3.  Depression/mood: History of anxiety but no major depression  4.  Hearing: No deficits  5.  ADL's: Independent  6.  Fall risk: Low  7.  Home safety: No problems identified  8.  Height weight, and visual acuity; height and weight stable no change in visual acuity.  We'll schedule bone density study.  Does get annual eye examinations.  9.  Counseling: Continue heart healthy diet more rigorous activity level.  Encouraged  10. Lab orders based on risk factors: Laboratory studies reviewed  11. Referral : Follow-up OB/GYN.  Referred for bone density  12. Care plan: We'll continue colonoscopies at five-year intervals.  Heart healthy diet regular exercise.  All encouraged  13. Cognitive assessment: Alert and oriented with normal affect no cognitive dysfunction  14. Screening: Patient provided with a written and personalized 5-10 year screening schedule in the AVS.  .  We'll continue annual clinical exams as well as annual follow-up with gynecology and  ophthalmology.  Bone density study will be reviewed  15. Provider List Update: Includes primary care medicine.  GYN ophthalmology and radiology as well as GI     Review of Systems  Constitutional: Negative for fever, appetite change, fatigue and unexpected weight change.  HENT: Negative for congestion, dental problem, ear pain, hearing loss, mouth sores, nosebleeds, sinus pressure, sore throat, tinnitus, trouble swallowing and voice change.   Eyes: Negative for  photophobia, pain, redness and visual disturbance.  Respiratory: Negative for cough, chest tightness and shortness of breath.   Cardiovascular: Negative for chest pain, palpitations and leg swelling.  Gastrointestinal: Negative for nausea, vomiting, abdominal pain, diarrhea, constipation, blood in stool, abdominal distention and rectal pain.  Genitourinary: Negative for dysuria, urgency, frequency, hematuria, flank pain, vaginal bleeding, vaginal discharge, difficulty urinating, genital sores, vaginal pain, menstrual problem and pelvic pain.  Musculoskeletal: Negative for back pain, arthralgias and neck stiffness.  Skin: Negative for rash.  Neurological: Negative for dizziness, syncope, speech difficulty, weakness, light-headedness, numbness and headaches.  Hematological: Negative for adenopathy. Does not bruise/bleed easily.  Psychiatric/Behavioral: Negative for suicidal ideas, behavioral problems, self-injury, dysphoric mood and agitation. The patient is not nervous/anxious.        Objective:   Physical Exam  Constitutional: She is oriented to person, place, and time. She appears well-developed and well-nourished.  HENT:  Head: Normocephalic and atraumatic.  Right Ear: External ear normal.  Left Ear: External ear normal.  Mouth/Throat: Oropharynx is clear and moist.  Low hanging soft palate  Eyes: Conjunctivae and EOM are normal.  Neck: Normal range of motion. Neck supple. No JVD present. No thyromegaly present.  Cardiovascular: Normal rate, regular rhythm, normal heart sounds and intact distal pulses.   No murmur heard. Dorsalis pedis pulses full. Posterior tibia pulses faint  Pulmonary/Chest: Effort normal and breath sounds normal. She has no wheezes. She has no rales.  Abdominal: Soft. Bowel sounds are normal. She exhibits no distension and no mass. There is no tenderness. There is no rebound and no guarding.  Musculoskeletal: Normal range of motion. She exhibits no edema or  tenderness.  Neurological: She is alert and oriented to person, place, and time. She has normal reflexes. No cranial nerve deficit. She exhibits normal muscle tone. Coordination normal.  Skin: Skin is warm and dry. No rash noted.  Psychiatric: She has a normal mood and affect. Her behavior is normal.          Assessment & Plan:    Preventive health examination Colonic polyps with family history of colon cancer. Followup colonoscopy at five-year intervals Hypertension. Well controlled on diuretic therapy Allergic rhinitis stable  Low salt diet encouraged regular exercise will be continued. She is asked to monitor blood pressure readings at home occasionally. Recheck 1 year More regular exercise and weight loss encouraged Follow-up OB/GYN next month as scheduled Schedule bone density  Annual eye examination encouraged  [name], MD

## 2015-11-04 NOTE — Progress Notes (Signed)
Pre visit review using our clinic review tool, if applicable. No additional management support is needed unless otherwise documented below in the visit note. 

## 2015-11-04 NOTE — Patient Instructions (Signed)
Limit your sodium (Salt) intake  Please check your blood pressure on a regular basis.  If it is consistently greater than 150/90, please make an office appointment.    It is important that you exercise regularly, at least 20 minutes 3 to 4 times per week.  If you develop chest pain or shortness of breath seek  medical attention.  Take a calcium supplement, plus [phone] units of vitamin D  Bone density study as discussed  OB/GYN.  Follow-up as scheduled  Annual mammogram Colonoscopy 2018   Menopause is a normal process in which your reproductive ability comes to an end. This process happens gradually over a span of months to years, usually between the ages of 61 and 1. Menopause is complete when you have missed 12 consecutive menstrual periods. It is important to talk with your health care provider about some of the most common conditions that affect postmenopausal women, such as heart disease, cancer, and bone loss (osteoporosis). Adopting a healthy lifestyle and getting preventive care can help to promote your health and wellness. Those actions can also lower your chances of developing some of these common conditions. WHAT SHOULD I KNOW ABOUT MENOPAUSE? During menopause, you may experience a number of symptoms, such as:  Moderate-to-severe hot flashes.  Night sweats.  Decrease in sex drive.  Mood swings.  Headaches.  Tiredness.  Irritability.  Memory problems.  Insomnia. Choosing to treat or not to treat menopausal changes is an individual decision that you make with your health care provider. WHAT SHOULD I KNOW ABOUT HORMONE REPLACEMENT THERAPY AND SUPPLEMENTS? Hormone therapy products are effective for treating symptoms that are associated with menopause, such as hot flashes and night sweats. Hormone replacement carries certain risks, especially as you become older. If you are thinking about using estrogen or estrogen with progestin treatments, discuss the benefits and  risks with your health care provider. WHAT SHOULD I KNOW ABOUT HEART DISEASE AND STROKE? Heart disease, heart attack, and stroke become more likely as you age. This may be due, in part, to the hormonal changes that your body experiences during menopause. These can affect how your body processes dietary fats, triglycerides, and cholesterol. Heart attack and stroke are both medical emergencies. There are many things that you can do to help prevent heart disease and stroke:  Have your blood pressure checked at least every 1-2 years. High blood pressure causes heart disease and increases the risk of stroke.  If you are 12-3 years old, ask your health care provider if you should take aspirin to prevent a heart attack or a stroke.  Do not use any tobacco products, including cigarettes, chewing tobacco, or electronic cigarettes. If you need help quitting, ask your health care provider.  It is important to eat a healthy diet and maintain a healthy weight.  Be sure to include plenty of vegetables, fruits, low-fat dairy products, and lean protein.  Avoid eating foods that are high in solid fats, added sugars, or salt (sodium).  Get regular exercise. This is one of the most important things that you can do for your health.  Try to exercise for at least 150 minutes each week. The type of exercise that you do should increase your heart rate and make you sweat. This is known as moderate-intensity exercise.  Try to do strengthening exercises at least twice each week. Do these in addition to the moderate-intensity exercise.  Know your numbers.Ask your health care provider to check your cholesterol and your blood glucose. Continue to  have your blood tested as directed by your health care provider. WHAT SHOULD I KNOW ABOUT CANCER SCREENING? There are several types of cancer. Take the following steps to reduce your risk and to catch any cancer development as early as possible. Breast Cancer  Practice  breast self-awareness.  This means understanding how your breasts normally appear and feel.  It also means doing regular breast self-exams. Let your health care provider know about any changes, no matter how small.  If you are 25 or older, have a clinician do a breast exam (clinical breast exam or CBE) every year. Depending on your age, family history, and medical history, it may be recommended that you also have a yearly breast X-ray (mammogram).  If you have a family history of breast cancer, talk with your health care provider about genetic screening.  If you are at high risk for breast cancer, talk with your health care provider about having an MRI and a mammogram every year.  Breast cancer (BRCA) gene test is recommended for women who have family members with BRCA-related cancers. Results of the assessment will determine the need for genetic counseling and BRCA1 and for BRCA2 testing. BRCA-related cancers include these types:  Breast. This occurs in males or females.  Ovarian.  Tubal. This may also be called fallopian tube cancer.  Cancer of the abdominal or pelvic lining (peritoneal cancer).  Prostate.  Pancreatic. Cervical, Uterine, and Ovarian Cancer Your health care provider may recommend that you be screened regularly for cancer of the pelvic organs. These include your ovaries, uterus, and vagina. This screening involves a pelvic exam, which includes checking for microscopic changes to the surface of your cervix (Pap test).  For women ages 21-65, health care providers may recommend a pelvic exam and a Pap test every three years. For women ages 50-65, they may recommend the Pap test and pelvic exam, combined with testing for human papilloma virus (HPV), every five years. Some types of HPV increase your risk of cervical cancer. Testing for HPV may also be done on women of any age who have unclear Pap test results.  Other health care providers may not recommend any screening for  nonpregnant women who are considered low risk for pelvic cancer and have no symptoms. Ask your health care provider if a screening pelvic exam is right for you.  If you have had past treatment for cervical cancer or a condition that could lead to cancer, you need Pap tests and screening for cancer for at least 20 years after your treatment. If Pap tests have been discontinued for you, your risk factors (such as having a new sexual partner) need to be reassessed to determine if you should start having screenings again. Some women have medical problems that increase the chance of getting cervical cancer. In these cases, your health care provider may recommend that you have screening and Pap tests more often.  If you have a family history of uterine cancer or ovarian cancer, talk with your health care provider about genetic screening.  If you have vaginal bleeding after reaching menopause, tell your health care provider.  There are currently no reliable tests available to screen for ovarian cancer. Lung Cancer Lung cancer screening is recommended for adults 58-33 years old who are at high risk for lung cancer because of a history of smoking. A yearly low-dose CT scan of the lungs is recommended if you:  Currently smoke.  Have a history of at least 30 pack-years of smoking and  you currently smoke or have quit within the past 15 years. A pack-year is smoking an average of one pack of cigarettes per day for one year. Yearly screening should:  Continue until it has been 15 years since you quit.  Stop if you develop a health problem that would prevent you from having lung cancer treatment. Colorectal Cancer  This type of cancer can be detected and can often be prevented.  Routine colorectal cancer screening usually begins at age 79 and continues through age 14.  If you have risk factors for colon cancer, your health care provider may recommend that you be screened at an earlier age.  If you have  a family history of colorectal cancer, talk with your health care provider about genetic screening.  Your health care provider may also recommend using home test kits to check for hidden blood in your stool.  A small camera at the end of a tube can be used to examine your colon directly (sigmoidoscopy or colonoscopy). This is done to check for the earliest forms of colorectal cancer.  Direct examination of the colon should be repeated every 5-10 years until age 94. However, if early forms of precancerous polyps or small growths are found or if you have a family history or genetic risk for colorectal cancer, you may need to be screened more often. Skin Cancer  Check your skin from head to toe regularly.  Monitor any moles. Be sure to tell your health care provider:  About any new moles or changes in moles, especially if there is a change in a mole's shape or color.  If you have a mole that is larger than the size of a pencil eraser.  If any of your family members has a history of skin cancer, especially at a young age, talk with your health care provider about genetic screening.  Always use sunscreen. Apply sunscreen liberally and repeatedly throughout the day.  Whenever you are outside, protect yourself by wearing long sleeves, pants, a wide-brimmed hat, and sunglasses. WHAT SHOULD I KNOW ABOUT OSTEOPOROSIS? Osteoporosis is a condition in which bone destruction happens more quickly than new bone creation. After menopause, you may be at an increased risk for osteoporosis. To help prevent osteoporosis or the bone fractures that can happen because of osteoporosis, the following is recommended:  If you are 40-49 years old, get at least 1,000 mg of calcium and at least 600 mg of vitamin D per day.  If you are older than age 55 but younger than age 64, get at least 1,200 mg of calcium and at least 600 mg of vitamin D per day.  If you are older than age 19, get at least 1,200 mg of calcium and  at least 800 mg of vitamin D per day. Smoking and excessive alcohol intake increase the risk of osteoporosis. Eat foods that are rich in calcium and vitamin D, and do weight-bearing exercises several times each week as directed by your health care provider. WHAT SHOULD I KNOW ABOUT HOW MENOPAUSE AFFECTS [place]? Depression may occur at any age, but it is more common as you become older. Common symptoms of depression include:  Low or sad mood.  Changes in sleep patterns.  Changes in appetite or eating patterns.  Feeling an overall lack of motivation or enjoyment of activities that you previously enjoyed.  Frequent crying spells. Talk with your health care provider if you think that you are experiencing depression. WHAT SHOULD I KNOW ABOUT IMMUNIZATIONS?  It is important that you get and maintain your immunizations. These include:  Tetanus, diphtheria, and pertussis (Tdap) booster vaccine.  Influenza every year before the flu season begins.  Pneumonia vaccine.  Shingles vaccine. Your health care provider may also recommend other immunizations.   This information is not intended to replace advice given to you by your health care provider. Make sure you discuss any questions you have with your health care provider.   Document Released: 07/23/2005 Document Revised: 06/21/2014 Document Reviewed: 01/31/2014 Elsevier Interactive Patient Education [employer].

## 2015-11-11 ENCOUNTER — Ambulatory Visit (INDEPENDENT_AMBULATORY_CARE_PROVIDER_SITE_OTHER)
Admission: RE | Admit: 2015-11-11 | Discharge: 2015-11-11 | Disposition: A | Discharge status: Home / Self Care | Payer: Medicare Other | Source: Ambulatory Visit

## 2015-11-11 DIAGNOSIS — E2839 Other primary ovarian failure: Secondary | ICD-10-CM

## 2015-11-18 ENCOUNTER — Telehealth: Payer: Self-pay | Admitting: Internal Medicine

## 2015-11-18 NOTE — Telephone Encounter (Signed)
Pt returning your call concerning bone density results.

## 2015-11-18 NOTE — Telephone Encounter (Signed)
Called pt back, left detailed message see result notes.

## 2015-12-05 ENCOUNTER — Other Ambulatory Visit: Payer: Self-pay | Admitting: Internal Medicine

## 2016-01-13 ENCOUNTER — Telehealth: Payer: Self-pay | Admitting: Internal Medicine

## 2016-01-13 NOTE — Telephone Encounter (Signed)
Patient Name: Kristy Becker  DOB: Mar 23, 1951    Initial Comment [name] states she was out in her barn, she got stung by something.    Nurse Assessment  Nurse: Raphael Gibney, RN, Vanita Ingles Date/Time (Eastern Time): 01/13/2016 10:25:12 AM  Confirm and document reason for call. If symptomatic, describe symptoms. You must click the next button to save text entered. ---[name] states she was in the barn and something stung her. She has used benadryl. it stung her on the inside of her left arm. Happened about 7 am this am. Swelling has decreased.  Has the patient traveled out of the country within the last 30 days? ---Not Applicable  Does the patient have any new or worsening symptoms? ---Yes  Will a triage be completed? ---Yes  Related visit to physician within the last 2 weeks? ---No  Does the PT have any chronic conditions? (i.e. diabetes, asthma, etc.) ---Yes  List chronic conditions. ---HTN  Is this a behavioral health or substance abuse call? ---No     Guidelines    Guideline Title Affirmed Question Affirmed Notes  Bee or Yellow Jacket Sting Normal local reaction to bee, wasp, or yellow jacket sting (all triage questions negative)    Final Disposition User   [place], RN, Vanita Ingles    Disagree/Comply: Comply

## 2016-01-28 ENCOUNTER — Other Ambulatory Visit: Payer: Self-pay | Admitting: Obstetrics and Gynecology

## 2016-01-29 LAB — CYTOLOGY - PAP

## 2016-03-11 ENCOUNTER — Encounter: Payer: Self-pay | Admitting: Internal Medicine

## 2016-04-08 ENCOUNTER — Encounter: Payer: Self-pay | Admitting: Family Medicine

## 2016-04-08 ENCOUNTER — Ambulatory Visit (INDEPENDENT_AMBULATORY_CARE_PROVIDER_SITE_OTHER): Payer: Medicare Other | Admitting: Family Medicine

## 2016-04-08 VITALS — BP 134/76 | HR 88 | Temp 98.5°F | Wt 159.8 lb

## 2016-04-08 DIAGNOSIS — R609 Edema, unspecified: Secondary | ICD-10-CM

## 2016-04-08 DIAGNOSIS — I10 Essential (primary) hypertension: Secondary | ICD-10-CM | POA: Diagnosis not present

## 2016-04-08 DIAGNOSIS — H539 Unspecified visual disturbance: Secondary | ICD-10-CM | POA: Diagnosis not present

## 2016-04-08 LAB — COMPREHENSIVE METABOLIC PANEL
ALT: 23 U/L (ref 0–35)
AST: 16 U/L (ref 0–37)
Albumin: 4.2 g/dL (ref 3.5–5.2)
Alkaline Phosphatase: 61 U/L (ref 39–117)
BILIRUBIN TOTAL: 0.4 mg/dL (ref 0.2–1.2)
BUN: 13 mg/dL (ref 6–23)
CO2: 27 meq/L (ref 19–32)
CREATININE: 0.73 mg/dL (ref 0.40–1.20)
Calcium: 10 mg/dL (ref 8.4–10.5)
Chloride: 102 mEq/L (ref 96–112)
GFR: 102.41 mL/min (ref >60.00)
GLUCOSE: 85 mg/dL (ref 70–99)
Potassium: 3.9 mEq/L (ref 3.5–5.1)
Sodium: 139 mEq/L (ref 135–145)
Total Protein: 7.5 g/dL (ref 6.0–8.3)

## 2016-04-08 NOTE — Progress Notes (Signed)
Subjective:  Kristy Becker is a 65 y.o. year old very pleasant female patient who presents for/with See problem oriented charting ROS- no orthopnea, PND, shortness of breath, chest pain.see any ROS included in HPI as well.   Past Medical History-  Patient Active Problem List   Diagnosis Date Noted  . History of colonic polyps 02/26/2009  . Allergic rhinitis 10/06/2007  . Backache 08/21/2007  . ECZEMA, HANDS 05/08/2007  . Anxiety state 01/31/2007  . Essential hypertension 01/31/2007    Medications- reviewed and updated Current Outpatient Prescriptions  Medication Sig Dispense Refill  . benazepril-hydrochlorthiazide (LOTENSIN HCT) 20-12.5 MG tablet TAKE 1 TABLET BY MOUTH DAILY. 30 tablet 5  . fexofenadine (ALLEGRA) 180 MG tablet Take 180 mg by mouth daily as needed.  4  . LORazepam (ATIVAN) 0.5 MG tablet TAKE 1 TABLET BY MOUTH TWICE A DAY AS NEEDED FOR ANXIETY 60 tablet 2  . miconazole (MICOTIN) 2 % cream Apply 1 application topically at bedtime.    . mometasone (ELOCON) 0.1 % cream APPLY TO AFFECTED AREA 2 TIMES DAILY  1  . Multiple Vitamin (MULTIVITAMIN WITH MINERALS) TABS Take 1 tablet by mouth daily.    . Olopatadine HCl 0.6 % SOLN Place 2 puffs into the nose as needed.    . pantoprazole (PROTONIX) 40 MG tablet Take 40 mg by mouth daily.    Marland Kitchen triamcinolone cream (KENALOG) 0.1 %   2   No current facility-administered medications for this visit.     Objective: BP 134/76 (BP Location: Left Arm, Patient Position: Sitting, Cuff Size: Normal)   Pulse 88   Temp 98.5 F (36.9 C) (Oral)   Wt 159 lb 12.8 oz (72.5 kg)   SpO2 95%   BMI 29.70 kg/m  Gen: NAD, resting comfortably CV: RRR no murmurs rubs or gallops Lungs: CTAB no crackles, wheeze, rhonchi Abdomen: soft/nontender/nondistended/normal bowel sounds. No rebound or guarding.  Ext: trace edema Skin: warm, dry Neuro: CN II-XII intact, sensation and reflexes normal throughout, 5/5 muscle strength in bilateral upper and  lower extremities. Normal finger to nose. Normal rapid alternating movements. No pronator drift. Normal romberg. Normal gait.    Assessment/Plan:  Essential hypertension Vision changes Edema S: controlled on benazepril hctz 20-12.5 mg Over the weekend- one dayFelt like she had a hard time adjusting to light- seemed really bright- states "very hard to explain". She thought it was her eyes- they told her that her blood pressure was fluctuating. They stated slightly high in their office. Has ativan but did not take before the vision. No recurrence of bright lights since that time other than when eyes were dilated. BP 156 in their office- admits to higher anxiety at that time. She has checked several #s at home and has had a few in 150s but on recheck goes back into normal <140/90 range.  BP Readings from Last 3 Encounters:  04/08/16 134/76  11/04/15 130/80  07/22/15 138/70  A/P: For HTN- Continue current meds:  Appears controlled on current regimen.   Vision issues- She has a hard time explaining what the issue was with her vision other than lights were brighter and scary for her although she admits to very high anxiety at baseline. She worries about this representing stroke or TIA- she has reassuring neuro exam- advised her add aspirin if she wants to lower CVA risk and follow up with Dr. Raliegh Ip at next available. I told her I thought low probability of neuro event. She has already had eyes examined.  Edema- She does also complain of some edema over last month or some so will get cmet, cbc, tsh. No signs of fluid overload other than some edema- not checking BNP  Advised sooner follow up if recurrent symptoms and discussed emergent/urgent return precuations  Orders Placed This Encounter  Procedures  . TSH    [place]  . CBC    [place]  . Comprehensive metabolic panel    [place]     Return precautions advised.  Garret Reddish, MD

## 2016-04-08 NOTE — Assessment & Plan Note (Addendum)
Vision changes Edema S: controlled on benazepril hctz 20-12.5 mg Over the weekend- one dayFelt like she had a hard time adjusting to light- seemed really bright- states "very hard to explain". She thought it was her eyes- they told her that her blood pressure was fluctuating. They stated slightly high in their office. Has ativan but did not take before the vision. No recurrence of bright lights since that time other than when eyes were dilated. BP 156 in their office- admits to higher anxiety at that time. She has checked several #s at home and has had a few in 150s but on recheck goes back into normal <140/90 range.  BP Readings from Last 3 Encounters:  04/08/16 134/76  11/04/15 130/80  07/22/15 138/70  A/P: For HTN- Continue current meds:  Appears controlled on current regimen.   Vision issues- She has a hard time explaining what the issue was with her vision other than lights were brighter and scary for her although she admits to very high anxiety at baseline. She worries about this representing stroke or TIA- she has reassuring neuro exam- advised her add aspirin if she wants to lower CVA risk and follow up with Dr. Raliegh Ip at next available. I told her I thought low probability of neuro event. She has already had eyes examined.   Edema- She does also complain of some edema over last month or some so will get cmet, cbc, tsh. No signs of fluid overload other than some edema- not checking BNP

## 2016-04-08 NOTE — Patient Instructions (Signed)
Labs before you leave  Completely normal neurological exam  Your concern was for stroke or ministroke. Definitely no evidence of stroke. I highly doubt ministroke as well but let's add a baby aspirin 81mg  as an added precaution  Schedule next available follow up with Dr. Raliegh Ip

## 2016-04-08 NOTE — Progress Notes (Signed)
Pre visit review using our clinic review tool, if applicable. No additional management support is needed unless otherwise documented below in the visit note. 

## 2016-04-09 LAB — CBC
HEMATOCRIT: 40.4 % (ref 36.0–46.0)
Hemoglobin: 13.6 g/dL (ref 12.0–15.0)
MCHC: 33.6 g/dL (ref 30.0–36.0)
MCV: 88.2 fl (ref 78.0–100.0)
PLATELETS: 256 10*3/uL (ref 150.0–400.0)
RBC: 4.58 Mil/uL (ref 3.87–5.11)
RDW: 12.6 % (ref 11.5–15.5)
WBC: 5.4 10*3/uL (ref 4.0–10.5)

## 2016-04-12 LAB — TSH: TSH: 0.23 u[IU]/mL — ABNORMAL LOW (ref 0.35–4.50)

## 2016-04-13 ENCOUNTER — Ambulatory Visit: Payer: Medicare Other | Admitting: Internal Medicine

## 2016-04-15 ENCOUNTER — Encounter: Payer: Self-pay | Admitting: Internal Medicine

## 2016-04-15 ENCOUNTER — Ambulatory Visit (INDEPENDENT_AMBULATORY_CARE_PROVIDER_SITE_OTHER): Payer: Medicare Other | Admitting: Internal Medicine

## 2016-04-15 VITALS — BP 150/74 | HR 78 | Temp 98.0°F | Resp 20 | Ht 61.5 in | Wt 160.0 lb

## 2016-04-15 DIAGNOSIS — I10 Essential (primary) hypertension: Secondary | ICD-10-CM

## 2016-04-15 DIAGNOSIS — H539 Unspecified visual disturbance: Secondary | ICD-10-CM | POA: Diagnosis not present

## 2016-04-15 DIAGNOSIS — F411 Generalized anxiety disorder: Secondary | ICD-10-CM | POA: Diagnosis not present

## 2016-04-15 DIAGNOSIS — Z23 Encounter for immunization: Secondary | ICD-10-CM

## 2016-04-15 DIAGNOSIS — J3089 Other allergic rhinitis: Secondary | ICD-10-CM | POA: Diagnosis not present

## 2016-04-15 NOTE — Progress Notes (Signed)
Subjective:    Patient ID: Kristy Becker, female    DOB: 09-07-1950, 65 y.o.   MRN: [id number]  HPI  65 year old patient who has a history of essential hypertension, anxiety disorder, as well as allergic rhinitis. The patient was seen by her eye doctor last week due to some visual disturbances.  Symptoms are vague but she basically describes some sensitivity to light.  She was seen by her eye doctor 8 days ago with a normal exam.  She is also followed up with allergy medicine and a new eyedrop dispensed. She continues to have some vague visual disturbances  that  fluctuate from the right and left eye.  She was concerned about accelerated hypertension or possibly a stroke symptom. She was recently started on low-dose aspirin for stroke primary prevention  Past Medical History:  Diagnosis Date  . ALLERGIC RHINITIS 10/06/2007  . ANXIETY 01/31/2007  . BACK PAIN 08/21/2007  . COLONIC POLYPS, HX OF 02/26/2009  . ECZEMA, HANDS 05/08/2007  . HYPERTENSION 01/31/2007     Social History   Social History  . Marital status: Married    Spouse name: N/A  . Number of children: N/A  . Years of education: N/A   Occupational History  . Not on file.   Social History Main Topics  . Smoking status: Never Smoker  . Smokeless tobacco: Never Used  . Alcohol use No  . Drug use: No  . Sexual activity: Not on file   Other Topics Concern  . Not on file   Social History Narrative  . No narrative on file    Past Surgical History:  Procedure Laterality Date  . TONSILLECTOMY AND ADENOIDECTOMY      No family history on file.  Allergies  Allergen Reactions  . Azithromycin     nausea  . Cephalosporins Other (See Comments)    unknown  . Clarithromycin Other (See Comments)    unknown  . Doxycycline Hyclate Other (See Comments)  . Penicillins Hives    Current Outpatient Prescriptions on File Prior to Visit  Medication Sig Dispense Refill  . benazepril-hydrochlorthiazide (LOTENSIN HCT)  20-12.5 MG tablet TAKE 1 TABLET BY MOUTH DAILY. 30 tablet 5  . fexofenadine (ALLEGRA) 180 MG tablet Take 180 mg by mouth daily as needed.  4  . LORazepam (ATIVAN) 0.5 MG tablet TAKE 1 TABLET BY MOUTH TWICE A DAY AS NEEDED FOR ANXIETY 60 tablet 2  . miconazole (MICOTIN) 2 % cream Apply 1 application topically at bedtime.    . mometasone (ELOCON) 0.1 % cream APPLY TO AFFECTED AREA 2 TIMES DAILY  1  . Multiple Vitamin (MULTIVITAMIN WITH MINERALS) TABS Take 1 tablet by mouth daily.    . Olopatadine HCl 0.6 % SOLN Place 2 puffs into the nose as needed.    . pantoprazole (PROTONIX) 40 MG tablet Take 40 mg by mouth daily.    Marland Kitchen triamcinolone cream (KENALOG) 0.1 %   2   No current facility-administered medications on file prior to visit.     BP (!) 150/74 (BP Location: Left Arm, Patient Position: Sitting, Cuff Size: Normal)   Pulse 78   Temp 98 F (36.7 C) (Oral)   Resp 20   Ht 5' 1.5" (1.562 m)   Wt 160 lb (72.6 kg)   SpO2 98%   BMI 29.74 kg/m      Review of Systems  Constitutional: Negative.   HENT: Positive for congestion and sinus pressure.   Eyes: Positive for photophobia and visual disturbance.  Negative for pain, discharge, redness and itching.  Respiratory: Negative for cough.        Objective:   Physical Exam  Constitutional: She is oriented to person, place, and time. She appears well-developed and well-nourished.  Blood pressure 140/72  HENT:  Head: Normocephalic.  Right Ear: External ear normal.  Left Ear: External ear normal.  Mouth/Throat: Oropharynx is clear and moist.  Eyes: Conjunctivae and EOM are normal. Pupils are equal, round, and reactive to light. Right eye exhibits no discharge. Left eye exhibits no discharge.  Neck: Normal range of motion. Neck supple. No thyromegaly present.  Cardiovascular: Normal rate, regular rhythm, normal heart sounds and intact distal pulses.   Pulmonary/Chest: Effort normal and breath sounds normal. No respiratory distress. She  has no rales.  Abdominal: Soft. Bowel sounds are normal. She exhibits no mass. There is no tenderness.  Musculoskeletal: Normal range of motion.  Lymphadenopathy:    She has no cervical adenopathy.  Neurological: She is alert and oriented to person, place, and time.  Skin: Skin is warm and dry. No rash noted.  Psychiatric: She has a normal mood and affect. Her behavior is normal.          Assessment & Plan:   Essential hypertension, stable Allergic rhinitis Visual disturbance, unclear etiology.  Patient has been seen by ophthalmology.  We'll continue to follow.  Patient reassured that this does not sound like a TIA symptom.  Will report any new or worsening symptoms  [name]

## 2016-04-15 NOTE — Progress Notes (Signed)
Pre visit review using our clinic review tool, if applicable. No additional management support is needed unless otherwise documented below in the visit note. 

## 2016-04-15 NOTE — Patient Instructions (Signed)
Limit your sodium (Salt) intake  Please check your blood pressure on a regular basis.  If it is consistently greater than 150/90, please make an office appointment.  Call or return to clinic prn if these symptoms worsen or fail to improve as anticipated.  

## 2016-04-20 ENCOUNTER — Ambulatory Visit: Payer: Medicare Other | Admitting: Internal Medicine

## 2016-06-01 ENCOUNTER — Other Ambulatory Visit: Payer: Self-pay | Admitting: Internal Medicine

## 2016-06-02 ENCOUNTER — Ambulatory Visit (INDEPENDENT_AMBULATORY_CARE_PROVIDER_SITE_OTHER): Payer: Medicare Other | Admitting: Family Medicine

## 2016-06-02 DIAGNOSIS — Z23 Encounter for immunization: Secondary | ICD-10-CM | POA: Diagnosis not present

## 2016-06-18 ENCOUNTER — Ambulatory Visit (INDEPENDENT_AMBULATORY_CARE_PROVIDER_SITE_OTHER): Payer: Medicare Other | Admitting: Internal Medicine

## 2016-06-18 ENCOUNTER — Encounter: Payer: Self-pay | Admitting: Internal Medicine

## 2016-06-18 VITALS — BP 144/80 | HR 61 | Temp 98.3°F | Ht 61.5 in | Wt 162.6 lb

## 2016-06-18 DIAGNOSIS — I10 Essential (primary) hypertension: Secondary | ICD-10-CM | POA: Diagnosis not present

## 2016-06-18 MED ORDER — PREDNISONE 20 MG PO TABS: 20.0000 mg | ORAL_TABLET | Freq: Two times a day (BID) | ORAL | 0 refills | Status: DC

## 2016-06-18 NOTE — Patient Instructions (Signed)
Acute sinusitis symptoms are generally not helped by antibiotic therapy.  Use saline irrigation, warm  moist compresses and over-the-counter decongestants only as directed.  Call if there is no improvement in 5 to 7 days, or sooner if you develop increasing pain, fever, or any new symptoms. 

## 2016-06-18 NOTE — Progress Notes (Signed)
Pre visit review using our clinic review tool, if applicable. No additional management support is needed unless otherwise documented below in the visit note. 

## 2016-06-18 NOTE — Progress Notes (Signed)
Subjective:    Patient ID: Kristy Becker, female    DOB: 14-Sep-1950, 66 y.o.   MRN: [id number]  HPI  66 year old patient who has a history of essential hypertension, allergic rhinitis and anxiety disorder.  For the past 10 days she has had some increasing sore throat, sinus congestion, rhinorrhea as well as some cough productive of yellow sputum.  She has had some associated fatigue.  No fever, chills, wheezing or shortness of breath.  Past Medical History:  Diagnosis Date  . ALLERGIC RHINITIS 10/06/2007  . ANXIETY 01/31/2007  . BACK PAIN 08/21/2007  . COLONIC POLYPS, HX OF 02/26/2009  . ECZEMA, HANDS 05/08/2007  . HYPERTENSION 01/31/2007     Social History   Social History  . Marital status: Married    Spouse name: N/A  . Number of children: N/A  . Years of education: N/A   Occupational History  . Not on file.   Social History Main Topics  . Smoking status: Never Smoker  . Smokeless tobacco: Never Used  . Alcohol use No  . Drug use: No  . Sexual activity: Not on file   Other Topics Concern  . Not on file   Social History Narrative  . No narrative on file    Past Surgical History:  Procedure Laterality Date  . TONSILLECTOMY AND ADENOIDECTOMY      No family history on file.  Allergies  Allergen Reactions  . Azithromycin     nausea  . Cephalosporins Other (See Comments)    unknown  . Clarithromycin Other (See Comments)    unknown  . Doxycycline Hyclate Other (See Comments)  . Penicillins Hives    Current Outpatient Prescriptions on File Prior to Visit  Medication Sig Dispense Refill  . benazepril-hydrochlorthiazide (LOTENSIN HCT) 20-12.5 MG tablet TAKE 1 TABLET BY MOUTH DAILY. 30 tablet 5  . Bepotastine Besilate (BEPREVE) 1.5 % SOLN Place 1 drop into both eyes as needed.    . fexofenadine (ALLEGRA) 180 MG tablet Take 180 mg by mouth daily as needed.  4  . LORazepam (ATIVAN) 0.5 MG tablet TAKE 1 TABLET BY MOUTH TWICE A DAY AS NEEDED FOR ANXIETY 60 tablet  2  . miconazole (MICOTIN) 2 % cream Apply 1 application topically at bedtime.    . mometasone (ELOCON) 0.1 % cream APPLY TO AFFECTED AREA 2 TIMES DAILY  1  . MOMETASONE FUROATE NA Place 2 sprays into the nose daily. 50 mcg    . Multiple Vitamin (MULTIVITAMIN WITH MINERALS) TABS Take 1 tablet by mouth daily.    . Olopatadine HCl 0.6 % SOLN Place 2 puffs into the nose as needed.    . pantoprazole (PROTONIX) 40 MG tablet Take 40 mg by mouth daily.    Marland Kitchen triamcinolone cream (KENALOG) 0.1 %   2   No current facility-administered medications on file prior to visit.     BP (!) 144/80 (BP Location: Right Arm, Patient Position: Sitting, Cuff Size: Normal)   Pulse 61   Temp 98.3 F (36.8 C) (Oral)   Ht 5' 1.5" (1.562 m)   Wt 162 lb 9.6 oz (73.8 kg)   SpO2 98%   BMI 30.23 kg/m     Review of Systems  Constitutional: Positive for activity change, appetite change and fatigue. Negative for chills and fever.  HENT: Positive for congestion, postnasal drip, rhinorrhea and sinus pressure. Negative for dental problem, hearing loss, sore throat and tinnitus.   Eyes: Negative for pain, discharge and visual disturbance.  Respiratory:  Positive for cough. Negative for shortness of breath.   Cardiovascular: Negative for chest pain, palpitations and leg swelling.  Gastrointestinal: Negative for abdominal distention, abdominal pain, blood in stool, constipation, diarrhea, nausea and vomiting.  Genitourinary: Negative for difficulty urinating, dysuria, flank pain, frequency, hematuria, pelvic pain, urgency, vaginal bleeding, vaginal discharge and vaginal pain.  Musculoskeletal: Negative for arthralgias, gait problem and joint swelling.  Skin: Negative for rash.  Neurological: Negative for dizziness, syncope, speech difficulty, weakness, numbness and headaches.  Hematological: Negative for adenopathy.  Psychiatric/Behavioral: Negative for agitation, behavioral problems and dysphoric mood. The patient is not  nervous/anxious.        Objective:   Physical Exam  Constitutional: She is oriented to person, place, and time. She appears well-developed and well-nourished.  Afebrile.  No distress.  Normal blood pressure  HENT:  Head: Normocephalic.  Right Ear: External ear normal.  Left Ear: External ear normal.  Mouth/Throat: Oropharynx is clear and moist.  Congested  Eyes: Conjunctivae and EOM are normal. Pupils are equal, round, and reactive to light.  Neck: Normal range of motion. Neck supple. No thyromegaly present.  Cardiovascular: Normal rate, regular rhythm, normal heart sounds and intact distal pulses.   Pulmonary/Chest: Effort normal and breath sounds normal.  Abdominal: Soft. Bowel sounds are normal. She exhibits no mass. There is no tenderness.  Musculoskeletal: Normal range of motion.  Lymphadenopathy:    She has no cervical adenopathy.  Neurological: She is alert and oriented to person, place, and time.  Skin: Skin is warm and dry. No rash noted.  Psychiatric: She has a normal mood and affect. Her behavior is normal.          Assessment & Plan:   Allergic rhinitis flare.  Will treat with prednisone 20 mg twice a day for 6 days Hypertension, stable.  No change in medical regimen  Follow-up 3-6 months  Syleena Mchan Pilar Plate

## 2016-06-25 ENCOUNTER — Telehealth: Payer: Self-pay | Admitting: Internal Medicine

## 2016-06-25 NOTE — Telephone Encounter (Signed)
[facility] - Client [facility] Patient Name: Kristy Becker DOB: 12-14-1950 Initial Comment [name] had a sinus infection and was prescribed Prednisone 20 mg. Today she ran out of her acid reflux medicine. She took a gas x pill and B/P dropped to 108/50 she felt dizzy and everything got dark. Nurse Assessment Nurse: Dimas Chyle, RN, Dellis Filbert Date/Time Eilene Ghazi Time): 06/25/2016 3:49:18 PM Confirm and document reason for call. If symptomatic, describe symptoms. ---[name] had a sinus infection and was prescribed Prednisone 20 mg. Today she ran out of her acid reflux medicine. She took a Gas X pill and B/P dropped to 108/50. She felt dizzy and everything got dark. Current reading 128/58. Does the patient have any new or worsening symptoms? ---Yes Will a triage be completed? ---Yes Related visit to physician within the last 2 weeks? ---No Does the PT have any chronic conditions? (i.e. diabetes, asthma, etc.) ---Yes List chronic conditions. ---HTN Is this a behavioral health or substance abuse call? ---No Guidelines Guideline Title Affirmed Question Affirmed Notes Low Blood Pressure [id number] Fall in systolic BP > 20 mm Hg from normal AND [2] NOT dizzy, lightheaded, or weak (all triage questions negative) Final Disposition User See PCP When Office is Open (within 3 days) Dimas Chyle, RN, [employer] Referrals REFERRED TO PCP OFFICE Disagree/Comply: Leta Baptist

## 2016-06-25 NOTE — Telephone Encounter (Signed)
Spoke with pt and she has already scheduled appt with Dr Raliegh Ip for Monday. Advised her to monitor BP over weekend, if she feels dizzy or light-headed to make sure she sits down to try and avoid passing out and falling. Also advised her not to drive if felling lightheaded or dizzy. Asked her to write down her BP readings if possible and bring in on Monday. Nothing further needed at this time.

## 2016-06-28 ENCOUNTER — Ambulatory Visit (INDEPENDENT_AMBULATORY_CARE_PROVIDER_SITE_OTHER): Payer: Medicare Other | Admitting: Internal Medicine

## 2016-06-28 ENCOUNTER — Encounter: Payer: Self-pay | Admitting: Internal Medicine

## 2016-06-28 VITALS — BP 130/68 | HR 70 | Temp 97.7°F | Ht 61.5 in | Wt 162.6 lb

## 2016-06-28 DIAGNOSIS — R55 Syncope and collapse: Secondary | ICD-10-CM

## 2016-06-28 NOTE — Progress Notes (Signed)
Pre visit review using our clinic review tool, if applicable. No additional management support is needed unless otherwise documented below in the visit note. 

## 2016-06-28 NOTE — Progress Notes (Signed)
Subjective:    Patient ID: Kristy Becker, female    DOB: Jun 01, 1951, 66 y.o.   MRN: [id number]  HPI  BP Readings from Last 3 Encounters:  06/28/16 130/68  06/18/16 (!) 144/80  04/15/16 (!) 14/1    66 year old patient who has a history of essential hypertension.  The patient had a near syncopal episode 3 days ago.  Shortly after lunch she became weak, dizzy, flushed and had darkening of her vision, without complete loss of consciousness.  No prior episodes of syncope She otherwise has been anxious, but otherwise has felt well over the weekend. Shortly after the episode.  Her blood pressure was reportedly as low as 80 over 47  Past Medical History:  Diagnosis Date  . ALLERGIC RHINITIS 10/06/2007  . ANXIETY 01/31/2007  . BACK PAIN 08/21/2007  . COLONIC POLYPS, HX OF 02/26/2009  . ECZEMA, HANDS 05/08/2007  . HYPERTENSION 01/31/2007     Social History   Social History  . Marital status: Married    Spouse name: N/A  . Number of children: N/A  . Years of education: N/A   Occupational History  . Not on file.   Social History Main Topics  . Smoking status: Never Smoker  . Smokeless tobacco: Never Used  . Alcohol use No  . Drug use: No  . Sexual activity: Not on file   Other Topics Concern  . Not on file   Social History Narrative  . No narrative on file    Past Surgical History:  Procedure Laterality Date  . TONSILLECTOMY AND ADENOIDECTOMY      No family history on file.  Allergies  Allergen Reactions  . Azithromycin     nausea  . Cephalosporins Other (See Comments)    unknown  . Clarithromycin Other (See Comments)    unknown  . Doxycycline Hyclate Other (See Comments)  . Penicillins Hives    Current Outpatient Prescriptions on File Prior to Visit  Medication Sig Dispense Refill  . benazepril-hydrochlorthiazide (LOTENSIN HCT) 20-12.5 MG tablet TAKE 1 TABLET BY MOUTH DAILY. 30 tablet 5  . Bepotastine Besilate (BEPREVE) 1.5 % SOLN Place 1 drop into both  eyes as needed.    . fexofenadine (ALLEGRA) 180 MG tablet Take 180 mg by mouth daily as needed.  4  . LORazepam (ATIVAN) 0.5 MG tablet TAKE 1 TABLET BY MOUTH TWICE A DAY AS NEEDED FOR ANXIETY 60 tablet 2  . miconazole (MICOTIN) 2 % cream Apply 1 application topically at bedtime.    . mometasone (ELOCON) 0.1 % cream APPLY TO AFFECTED AREA 2 TIMES DAILY  1  . MOMETASONE FUROATE NA Place 2 sprays into the nose daily. 50 mcg    . Multiple Vitamin (MULTIVITAMIN WITH MINERALS) TABS Take 1 tablet by mouth daily.    . Olopatadine HCl 0.6 % SOLN Place 2 puffs into the nose as needed.    . pantoprazole (PROTONIX) 40 MG tablet Take 40 mg by mouth daily.    . predniSONE (DELTASONE) 20 MG tablet Take 1 tablet (20 mg total) by mouth 2 (two) times daily with a meal. 12 tablet 0  . triamcinolone cream (KENALOG) 0.1 %   2   No current facility-administered medications on file prior to visit.     BP 130/68 (BP Location: Right Arm, Patient Position: Sitting, Cuff Size: Normal)   Pulse 70   Temp 97.7 F (36.5 C) (Oral)   Ht 5' 1.5" (1.562 m)   Wt 162 lb 9.6 oz (73.8 kg)  SpO2 98%   BMI 30.23 kg/m     Review of Systems  HENT: Negative for congestion, dental problem, hearing loss, rhinorrhea, sinus pressure, sore throat and tinnitus.   Eyes: Negative for pain, discharge and visual disturbance.  Respiratory: Negative for cough and shortness of breath.   Cardiovascular: Negative for chest pain, palpitations and leg swelling.  Gastrointestinal: Negative for abdominal distention, abdominal pain, blood in stool, constipation, diarrhea, nausea and vomiting.  Genitourinary: Negative for difficulty urinating, dysuria, flank pain, frequency, hematuria, pelvic pain, urgency, vaginal bleeding, vaginal discharge and vaginal pain.  Musculoskeletal: Negative for arthralgias, gait problem and joint swelling.  Skin: Negative for rash.  Neurological: Positive for dizziness, weakness and light-headedness. Negative for  syncope, speech difficulty, numbness and headaches.  Hematological: Negative for adenopathy.  Psychiatric/Behavioral: Negative for agitation, behavioral problems and dysphoric mood. The patient is not nervous/anxious.        Objective:   Physical Exam  Constitutional: She is oriented to person, place, and time. She appears well-developed and well-nourished.  Blood pressure 130/70 without orthostatic changes Slightly anxious but no distress  HENT:  Head: Normocephalic.  Right Ear: External ear normal.  Left Ear: External ear normal.  Mouth/Throat: Oropharynx is clear and moist.  Eyes: Conjunctivae and EOM are normal. Pupils are equal, round, and reactive to light.  Neck: Normal range of motion. Neck supple. No thyromegaly present.  Cardiovascular: Normal rate, regular rhythm, normal heart sounds and intact distal pulses.   Pulmonary/Chest: Effort normal and breath sounds normal.  Abdominal: Soft. Bowel sounds are normal. She exhibits no mass. There is no tenderness.  Musculoskeletal: Normal range of motion.  Lymphadenopathy:    She has no cervical adenopathy.  Neurological: She is alert and oriented to person, place, and time.  Skin: Skin is warm and dry. No rash noted.  Psychiatric: She has a normal mood and affect. Her behavior is normal.          Assessment & Plan:   Episode of near-syncope.  Sounds vasovagal.  Discussed at length Essential hypertension.  No change in medical regimen  Return in 3 months for follow-up  Kristy Becker

## 2016-06-28 NOTE — Patient Instructions (Addendum)
Limit your sodium (Salt) intake  Please check your blood pressure on a regular basis.  If it is consistently greater than 150/90, please make an office appointment.    It is important that you exercise regularly, at least 20 minutes 3 to 4 times per week.  If you develop chest pain or shortness of breath seek  medical attention.   Vasovagal Syncope, Adult Syncope, which is commonly known as fainting or passing out, is a temporary loss of consciousness. It occurs when the blood flow to the brain is reduced. Vasovagal syncope, also called neurocardiogenic syncope, is a fainting spell that happens when blood flow to the brain is reduced because of a sudden drop in heart rate and blood pressure. Vasovagal syncope is usually harmless. However, you can get injured if you fall during a fainting spell. What are the causes? This condition is caused by a drop in heart rate and blood pressure, usually in response to a trigger. Many things and situations can trigger an episode, including:  Pain.  Fear.  The sight of blood. This may occur during medical procedures, such as when blood is being drawn from a vein.  Common activities, such as coughing, swallowing, stretching, or going to the bathroom.  Emotional stress.  Being in a confined space.  Prolonged standing, especially in a warm environment.  Lack of sleep or rest.  Not eating for a long time.  Not drinking enough liquids.  Recent illness.  Drinking alcohol.  Taking drugs that affect blood pressure, such as marijuana, cocaine, opiates, or inhalants. What are the signs or symptoms? Before a fainting episode, you may:  Feel dizzy or light-headed.  Become pale.  Sense that you are going to faint.  Feel like the room is spinning.  Only see directly ahead (tunnel vision).  Feel sick to your stomach (nauseous).  See spots.  Slowly lose vision.  Hear ringing in your ears.  Have a headache.  Feel warm and sweaty.  Feel  a sensation of pins and needles. During the fainting spell, you may twitch or make jerky movements. Fainting spells usually last no longer than a few minutes before you wake up. If you get up too quickly before your body can recover, you may faint again. How is this diagnosed? This condition is diagnosed based on your symptoms, your medical history, and a physical exam. Tests may be done to rule out other causes of fainting. Tests may include:  Blood tests.  Heart tests, such as an electrocardiogram (ECG), echocardiogram, or electrophysiology study.  A test to check your response to changes in position (tilt table test). How is this treated? Usually, treatment is not needed for this condition. Your health care provider may suggest ways to help prevent fainting episodes. These may include:  Drinking additional fluids if you are exposed to a trigger.  Sitting or lying down if you notice signs that an episode is coming. If your fainting spells continue, your health care provider may recommend that you:  Take medicines to prevent fainting or to help reduce further episodes of fainting.  Do certain exercises.  Wear compression stockings.  Have surgery to place a pacemaker in your body (rare). Follow these instructions at home:  Learn to identify the signs that an episode is coming.  Sit or lie down at the first sign of a fainting spell. If you sit down, put your head down between your legs. If you lie down, swing your legs up in the air to increase  blood flow to the brain.  Avoid hot tubs and saunas.  Avoid standing for a long time. If you have to stand for a long time, try:  Crossing your legs.  Flexing and stretching your leg muscles.  Squatting.  Moving your legs.  Bending over.  Drink enough fluid to keep your urine clear or pale yellow.  Make changes to your diet that your health care provider recommends. You may be told to:  Avoid caffeine.  Eat more salt.  Take  over-the-counter and prescription medicines only as told by your health care provider. Contact a health care provider if:  You continue to have fainting spells despite treatment.  You faint more often despite treatment.  You lose consciousness for more than a few minutes.  You faint during or after exercising or after being startled.  You have twitching or jerky movements for longer than a few seconds during a fainting spell.  You have an episode of twitching or jerky movements without fainting. Get help right away if:  A fainting spell leads to an injury or bleeding.  You have new symptoms that occur with the fainting spells, such as:  Shortness of breath.  Chest pain.  Irregular heartbeat.  You twitch or make jerky movements for more than 5 minutes.  You twitch or make jerky movements during more than one fainting spell. This information is not intended to replace advice given to you by your health care provider. Make sure you discuss any questions you have with your health care provider. Document Released: 05/17/2012 Document Revised: 11/12/2015 Document Reviewed: 03/29/2015 Elsevier Interactive Patient Education  2017 [employer].

## 2016-07-12 ENCOUNTER — Ambulatory Visit (INDEPENDENT_AMBULATORY_CARE_PROVIDER_SITE_OTHER): Payer: Medicare Other | Admitting: Internal Medicine

## 2016-07-12 ENCOUNTER — Encounter: Payer: Self-pay | Admitting: Internal Medicine

## 2016-07-12 VITALS — BP 132/70 | HR 72 | Temp 100.2°F | Ht 61.8 in | Wt 163.0 lb

## 2016-07-12 DIAGNOSIS — J111 Influenza due to unidentified influenza virus with other respiratory manifestations: Secondary | ICD-10-CM | POA: Diagnosis not present

## 2016-07-12 MED ORDER — OSELTAMIVIR PHOSPHATE 75 MG PO CAPS: 75.0000 mg | ORAL_CAPSULE | Freq: Two times a day (BID) | ORAL | 0 refills | Status: DC

## 2016-07-12 NOTE — Patient Instructions (Addendum)

## 2016-07-12 NOTE — Progress Notes (Signed)
Pre visit review using our clinic review tool, if applicable. No additional management support is needed unless otherwise documented below in the visit note. 

## 2016-07-12 NOTE — Progress Notes (Signed)
Subjective:    Patient ID: Kristy Becker, female    DOB: 09-22-1950, 66 y.o.   MRN: [id number]  HPI  66 year old patient who has a history of allergic rhinitis and essential hypertension.  She presents with a 2 to three-day history of fever, cough, mild sore throat and general sense of unwellness.  Her husband has been hospitalized recently for influenza A.  Past Medical History:  Diagnosis Date  . ALLERGIC RHINITIS 10/06/2007  . ANXIETY 01/31/2007  . BACK PAIN 08/21/2007  . COLONIC POLYPS, HX OF 02/26/2009  . ECZEMA, HANDS 05/08/2007  . HYPERTENSION 01/31/2007     Social History   Social History  . Marital status: Married    Spouse name: N/A  . Number of children: N/A  . Years of education: N/A   Occupational History  . Not on file.   Social History Main Topics  . Smoking status: Never Smoker  . Smokeless tobacco: Never Used  . Alcohol use No  . Drug use: No  . Sexual activity: Not on file   Other Topics Concern  . Not on file   Social History Narrative  . No narrative on file    Past Surgical History:  Procedure Laterality Date  . TONSILLECTOMY AND ADENOIDECTOMY      No family history on file.  Allergies  Allergen Reactions  . Azithromycin     nausea  . Cephalosporins Other (See Comments)    unknown  . Clarithromycin Other (See Comments)    unknown  . Doxycycline Hyclate Other (See Comments)  . Penicillins Hives    Current Outpatient Prescriptions on File Prior to Visit  Medication Sig Dispense Refill  . benazepril-hydrochlorthiazide (LOTENSIN HCT) 20-12.5 MG tablet TAKE 1 TABLET BY MOUTH DAILY. 30 tablet 5  . Bepotastine Besilate (BEPREVE) 1.5 % SOLN Place 1 drop into both eyes as needed.    . fexofenadine (ALLEGRA) 180 MG tablet Take 180 mg by mouth daily as needed.  4  . LORazepam (ATIVAN) 0.5 MG tablet TAKE 1 TABLET BY MOUTH TWICE A DAY AS NEEDED FOR ANXIETY 60 tablet 2  . miconazole (MICOTIN) 2 % cream Apply 1 application topically at bedtime.     . mometasone (ELOCON) 0.1 % cream APPLY TO AFFECTED AREA 2 TIMES DAILY  1  . MOMETASONE FUROATE NA Place 2 sprays into the nose daily. 50 mcg    . Multiple Vitamin (MULTIVITAMIN WITH MINERALS) TABS Take 1 tablet by mouth daily.    . Olopatadine HCl 0.6 % SOLN Place 2 puffs into the nose as needed.    . pantoprazole (PROTONIX) 40 MG tablet Take 40 mg by mouth daily.    Marland Kitchen triamcinolone cream (KENALOG) 0.1 %   2   No current facility-administered medications on file prior to visit.     BP 132/70 (BP Location: Right Arm, Patient Position: Sitting, Cuff Size: Normal)   Pulse 72   Temp 100.2 F (37.9 C) (Oral)   Ht 5' 1.8" (1.57 m)   Wt 163 lb (73.9 kg)   SpO2 97%   BMI 30.01 kg/m     Review of Systems  Constitutional: Positive for activity change, appetite change, fatigue and fever. Negative for chills and diaphoresis.  HENT: Positive for sore throat. Negative for congestion, dental problem, hearing loss, rhinorrhea, sinus pressure and tinnitus.   Eyes: Negative for pain, discharge and visual disturbance.  Respiratory: Positive for cough. Negative for shortness of breath.   Cardiovascular: Negative for chest pain, palpitations and leg  swelling.  Gastrointestinal: Negative for abdominal distention, abdominal pain, blood in stool, constipation, diarrhea, nausea and vomiting.  Genitourinary: Negative for difficulty urinating, dysuria, flank pain, frequency, hematuria, pelvic pain, urgency, vaginal bleeding, vaginal discharge and vaginal pain.  Musculoskeletal: Positive for myalgias. Negative for arthralgias, gait problem and joint swelling.  Skin: Negative for rash.  Neurological: Negative for dizziness, syncope, speech difficulty, weakness, numbness and headaches.  Hematological: Negative for adenopathy.  Psychiatric/Behavioral: Negative for agitation, behavioral problems and dysphoric mood. The patient is not nervous/anxious.        Objective:   Physical Exam  Constitutional:  She is oriented to person, place, and time. She appears well-developed and well-nourished.  Temperature 100.2  HENT:  Head: Normocephalic.  Right Ear: External ear normal.  Left Ear: External ear normal.  Mouth/Throat: Oropharynx is clear and moist.  Eyes: Conjunctivae and EOM are normal. Pupils are equal, round, and reactive to light.  Neck: Normal range of motion. Neck supple. No thyromegaly present.  Cardiovascular: Normal rate, regular rhythm, normal heart sounds and intact distal pulses.   Pulmonary/Chest: Effort normal and breath sounds normal. No respiratory distress. She has no wheezes. She has no rales.  Abdominal: Soft. Bowel sounds are normal. She exhibits no mass. There is no tenderness.  Musculoskeletal: Normal range of motion.  Lymphadenopathy:    She has no cervical adenopathy.  Neurological: She is alert and oriented to person, place, and time.  Skin: Skin is warm and dry. No rash noted.  Psychiatric: She has a normal mood and affect. Her behavior is normal.          Assessment & Plan:   Flu syndrome.  Influenza A exposure.  Moderate high risk due to age.  Will treat with Tamiflu 75 mg twice a day Symptom and fever control discussed  We'll call if unimproved  [name]

## 2016-08-13 ENCOUNTER — Ambulatory Visit (INDEPENDENT_AMBULATORY_CARE_PROVIDER_SITE_OTHER): Payer: Medicare Other | Admitting: Internal Medicine

## 2016-08-13 ENCOUNTER — Encounter: Payer: Self-pay | Admitting: Internal Medicine

## 2016-08-13 VITALS — BP 142/72 | HR 88 | Temp 98.2°F | Ht 61.8 in | Wt 159.4 lb

## 2016-08-13 DIAGNOSIS — I10 Essential (primary) hypertension: Secondary | ICD-10-CM | POA: Diagnosis not present

## 2016-08-13 DIAGNOSIS — R040 Epistaxis: Secondary | ICD-10-CM

## 2016-08-13 DIAGNOSIS — J301 Allergic rhinitis due to pollen: Secondary | ICD-10-CM

## 2016-08-13 NOTE — Patient Instructions (Addendum)
Hydrate and Humidify   Drink enough water to keep your urine clear or pale yellow. Staying hydrated will help to thin your mucus.  Use a cool mist humidifier to keep the humidity level in your home above 50%.  Inhale steam for 10-15 minutes, 3-4 times a day or as told by your health care provider. You can do this in the bathroom while a hot shower is running.  Limit your exposure to cool or dry air. Rest   Rest as much as possible.    Nosebleed, Adult A nosebleed is when blood comes out of the nose. Nosebleeds are common. Usually, they are not a sign of a serious condition. Nosebleeds can happen if a small blood vessel in your nose starts to bleed or if the lining of your nose (mucous membrane) cracks. They are commonly caused by:  Allergies.  Colds.  Picking your nose.  Blowing your nose too hard.  An injury from sticking an object into your nose or getting hit in the nose.  Dry or cold air.   Follow these instructions at home: When you have a nosebleed:   Sit down and tilt your head slightly forward.  Use a clean towel or tissue to pinch your nostrils under the bony part of your nose. After 10 minutes, let go of your nose and see if bleeding starts again. Do not release pressure before that time. If there is still bleeding, repeat the pinching and holding for 10 minutes until the bleeding stops.  Do not place tissues or gauze in the nose to stop bleeding.  Avoid lying down and avoid tilting your head backward. That may make blood collect in the throat and cause gagging or coughing.  Use a nasal spray decongestant to help with a nosebleed as told by your health care provider.  Do not use petroleum jelly or mineral oil in your nose. It can drip into your lungs. After a nosebleed:   Avoid blowing your nose or sniffing for a number of hours.  Avoid straining, lifting, or bending at the waist for several days. You may resume other normal activities as you are  able.  Use saline spray or a humidifier as told by your health care provider.  Aspirinand blood thinners make bleeding more likely. If you are prescribed these medicines and you suffer from nosebleeds:  Ask your health care provider if you should stop taking the medicines or if you should adjust the dose.  Do not stop taking medicines that your health care provider has recommended unless told by your health care provider.  If your nosebleed was caused by dry mucous membranes, use over-the-counter saline nasal spray or gel. This will keep the mucous membranes moist and allow them to heal. If you must use a lubricant:  Choose one that is water-soluble.  Use only as much as you need and use it only as often as needed.  Do not lie down until several hours after you use it. Contact a health care provider if:  You have a fever.  You get nosebleeds often or more often than usual.  You bruise very easily.  You have a nosebleed from having something stuck in your nose.  You have bleeding in your mouth.  You vomit or cough up brown material.  You have a nosebleed after you start a new medicine. Get help right away if:  You have a nosebleed after a fall or a head injury.  Your nosebleed does not go away after  20 minutes.  You feel dizzy or weak.  You have unusual bleeding from other parts of your body.  You have unusual bruising on other parts of your body.  You become sweaty.  You vomit blood. This information is not intended to replace advice given to you by your health care provider. Make sure you discuss any questions you have with your health care provider. Document Released: 03/10/2005 Document Revised: 01/29/2016 Document Reviewed: 12/16/2015 Elsevier Interactive Patient Education  2017 [employer].

## 2016-08-13 NOTE — Progress Notes (Signed)
Pre visit review using our clinic review tool, if applicable. No additional management support is needed unless otherwise documented below in the visit note. 

## 2016-08-13 NOTE — Progress Notes (Signed)
Subjective:    Patient ID: Kristy Becker, female    DOB: [birth year], 66 y.o.   MRN: [id number]  HPI 66 year old patient who has a history of allergic rhinitis for the past few days she has had scanty amount of bleeding from the right nares. She has a history of allergic rhinitis and has been using some nasal sprays. She has essential hypertension  Past Medical History:  Diagnosis Date  . ALLERGIC RHINITIS 10/06/2007  . ANXIETY 01/31/2007  . BACK PAIN 08/21/2007  . COLONIC POLYPS, HX OF 02/26/2009  . ECZEMA, HANDS 05/08/2007  . HYPERTENSION 01/31/2007     Social History   Social History  . Marital status: Married    Spouse name: N/A  . Number of children: N/A  . Years of education: N/A   Occupational History  . Not on file.   Social History Main Topics  . Smoking status: Never Smoker  . Smokeless tobacco: Never Used  . Alcohol use No  . Drug use: No  . Sexual activity: Not on file   Other Topics Concern  . Not on file   Social History Narrative  . No narrative on file    Past Surgical History:  Procedure Laterality Date  . TONSILLECTOMY AND ADENOIDECTOMY      No family history on file.  Allergies  Allergen Reactions  . Azithromycin     nausea  . Cephalosporins Other (See Comments)    unknown  . Clarithromycin Other (See Comments)    unknown  . Doxycycline Hyclate Other (See Comments)  . Penicillins Hives    Current Outpatient Prescriptions on File Prior to Visit  Medication Sig Dispense Refill  . benazepril-hydrochlorthiazide (LOTENSIN HCT) 20-12.5 MG tablet TAKE 1 TABLET BY MOUTH DAILY. 30 tablet 5  . Bepotastine Besilate (BEPREVE) 1.5 % SOLN Place 1 drop into both eyes as needed.    . fexofenadine (ALLEGRA) 180 MG tablet Take 180 mg by mouth daily as needed.  4  . LORazepam (ATIVAN) 0.5 MG tablet TAKE 1 TABLET BY MOUTH TWICE A DAY AS NEEDED FOR ANXIETY 60 tablet 2  . miconazole (MICOTIN) 2 % cream Apply 1 application topically at bedtime.    .  mometasone (ELOCON) 0.1 % cream APPLY TO AFFECTED AREA 2 TIMES DAILY  1  . MOMETASONE FUROATE NA Place 2 sprays into the nose daily. 50 mcg    . Multiple Vitamin (MULTIVITAMIN WITH MINERALS) TABS Take 1 tablet by mouth daily.    . Olopatadine HCl 0.6 % SOLN Place 2 puffs into the nose as needed.    Marland Kitchen oseltamivir (TAMIFLU) 75 MG capsule Take 1 capsule (75 mg total) by mouth 2 (two) times daily. 10 capsule 0  . pantoprazole (PROTONIX) 40 MG tablet Take 40 mg by mouth daily.    Marland Kitchen triamcinolone cream (KENALOG) 0.1 %   2   No current facility-administered medications on file prior to visit.     BP (!) 142/72 (BP Location: Left Arm, Patient Position: Sitting, Cuff Size: Normal)   Pulse 88   Temp 98.2 F (36.8 C) (Oral)   Ht 5' 1.8" (1.57 m)   Wt 159 lb 6.4 oz (72.3 kg)   SpO2 96%   BMI 29.34 kg/m      Review of Systems  Constitutional: Negative.   HENT: Positive for nosebleeds. Negative for congestion, dental problem, hearing loss, rhinorrhea, sinus pressure, sore throat and tinnitus.   Eyes: Negative for pain, discharge and visual disturbance.  Respiratory: Negative for cough  and shortness of breath.   Cardiovascular: Negative for chest pain, palpitations and leg swelling.  Gastrointestinal: Negative for abdominal distention, abdominal pain, blood in stool, constipation, diarrhea, nausea and vomiting.  Genitourinary: Negative for difficulty urinating, dysuria, flank pain, frequency, hematuria, pelvic pain, urgency, vaginal bleeding, vaginal discharge and vaginal pain.  Musculoskeletal: Negative for arthralgias, gait problem and joint swelling.  Skin: Negative for rash.  Neurological: Negative for dizziness, syncope, speech difficulty, weakness, numbness and headaches.  Hematological: Negative for adenopathy.  Psychiatric/Behavioral: Negative for agitation, behavioral problems and dysphoric mood. The patient is not nervous/anxious.        Objective:   Physical Exam    Constitutional: She appears well-developed and well-nourished. No distress.  HENT:  Right Ear: External ear normal.  Left Ear: External ear normal.  Mouth/Throat: Oropharynx is clear and moist.  Few scattered clots noted involving the right septum  Eyes: Conjunctivae are normal. Pupils are equal, round, and reactive to light.  Neck: Normal range of motion.  Cardiovascular: Normal rate.   Pulmonary/Chest: Breath sounds normal.          Assessment & Plan:   Mild epistaxis.  Will discontinue nasal sprays, except for saline irrigation Patient instructions dispensed  She will call if any clinical worsening  [name]

## 2016-10-05 ENCOUNTER — Other Ambulatory Visit: Payer: Self-pay | Admitting: Internal Medicine

## 2016-10-05 MED ORDER — BENAZEPRIL-HYDROCHLOROTHIAZIDE 20-12.5 MG PO TABS: 1.0000 | ORAL_TABLET | Freq: Every day | ORAL | 1 refills | Status: DC

## 2016-10-06 ENCOUNTER — Other Ambulatory Visit: Payer: Self-pay | Admitting: Internal Medicine

## 2016-10-06 MED ORDER — BENAZEPRIL-HYDROCHLOROTHIAZIDE 20-12.5 MG PO TABS
1.0000 | ORAL_TABLET | Freq: Every day | ORAL | 1 refills | Status: DC
Start: 2016-10-06 — End: 2016-12-08

## 2016-10-12 ENCOUNTER — Ambulatory Visit (INDEPENDENT_AMBULATORY_CARE_PROVIDER_SITE_OTHER): Payer: Medicare Other | Admitting: Internal Medicine

## 2016-10-12 ENCOUNTER — Encounter: Payer: Self-pay | Admitting: Internal Medicine

## 2016-10-12 VITALS — BP 142/60 | HR 80 | Temp 98.4°F | Wt 158.6 lb

## 2016-10-12 DIAGNOSIS — J301 Allergic rhinitis due to pollen: Secondary | ICD-10-CM

## 2016-10-12 DIAGNOSIS — I10 Essential (primary) hypertension: Secondary | ICD-10-CM

## 2016-10-12 MED ORDER — PREDNISONE 10 MG PO TABS: 10.0000 mg | ORAL_TABLET | Freq: Two times a day (BID) | ORAL | 0 refills | Status: DC

## 2016-10-12 NOTE — Patient Instructions (Signed)
CONTINUE PRESENT MEDICATIONS FOR ALLERGIES  Call or return to clinic prn if these symptoms worsen or fail to improve as anticipated.

## 2016-10-12 NOTE — Progress Notes (Signed)
Pre visit review using our clinic review tool, if applicable. No additional management support is needed unless otherwise documented below in the visit note. 

## 2016-10-12 NOTE — Progress Notes (Signed)
Subjective:    Patient ID: Kristy Becker, female    DOB: 11-13-1950, 66 y.o.   MRN: [id number]  HPI  66 year old patient who presents with a two-week history of sinus congestion, blurred vision, cough, rhinorrhea.  She also describes some light sensitivity and red itchy eyes.  She is followed by allergy medicine and on a number of medications for allergic rhinitis No fever.  Denies any wheezing or shortness of breath  Past Medical History:  Diagnosis Date  . ALLERGIC RHINITIS 10/06/2007  . ANXIETY 01/31/2007  . BACK PAIN 08/21/2007  . COLONIC POLYPS, HX OF 02/26/2009  . ECZEMA, HANDS 05/08/2007  . HYPERTENSION 01/31/2007     Social History   Social History  . Marital status: Married    Spouse name: N/A  . Number of children: N/A  . Years of education: N/A   Occupational History  . Not on file.   Social History Main Topics  . Smoking status: Never Smoker  . Smokeless tobacco: Never Used  . Alcohol use No  . Drug use: No  . Sexual activity: Not on file   Other Topics Concern  . Not on file   Social History Narrative  . No narrative on file    Past Surgical History:  Procedure Laterality Date  . TONSILLECTOMY AND ADENOIDECTOMY      No family history on file.  Allergies  Allergen Reactions  . Azithromycin     nausea  . Cephalosporins Other (See Comments)    unknown  . Clarithromycin Other (See Comments)    unknown  . Doxycycline Hyclate Other (See Comments)  . Penicillins Hives    Current Outpatient Prescriptions on File Prior to Visit  Medication Sig Dispense Refill  . benazepril-hydrochlorthiazide (LOTENSIN HCT) 20-12.5 MG tablet Take 1 tablet by mouth daily. 90 tablet 1  . Bepotastine Besilate (BEPREVE) 1.5 % SOLN Place 1 drop into both eyes as needed.    . fexofenadine (ALLEGRA) 180 MG tablet Take 180 mg by mouth daily as needed.  4  . LORazepam (ATIVAN) 0.5 MG tablet TAKE 1 TABLET BY MOUTH TWICE A DAY AS NEEDED FOR ANXIETY 60 tablet 2  . miconazole  (MICOTIN) 2 % cream Apply 1 application topically at bedtime.    . mometasone (ELOCON) 0.1 % cream APPLY TO AFFECTED AREA 2 TIMES DAILY  1  . MOMETASONE FUROATE NA Place 2 sprays into the nose daily. 50 mcg    . Multiple Vitamin (MULTIVITAMIN WITH MINERALS) TABS Take 1 tablet by mouth daily.    . Olopatadine HCl 0.6 % SOLN Place 2 puffs into the nose as needed.    Marland Kitchen oseltamivir (TAMIFLU) 75 MG capsule Take 1 capsule (75 mg total) by mouth 2 (two) times daily. 10 capsule 0  . pantoprazole (PROTONIX) 40 MG tablet Take 40 mg by mouth daily.    Marland Kitchen triamcinolone cream (KENALOG) 0.1 %   2   No current facility-administered medications on file prior to visit.     BP (!) 142/60 (BP Location: Left Arm, Patient Position: Sitting, Cuff Size: Normal)   Pulse 80   Temp 98.4 F (36.9 C) (Oral)   Wt 158 lb 9.6 oz (71.9 kg)   SpO2 98%   BMI 29.20 kg/m     Review of Systems  Constitutional: Positive for activity change, appetite change and fatigue.  HENT: Positive for congestion, postnasal drip, rhinorrhea and sinus pressure.   Eyes: Positive for redness and itching.  Respiratory: Positive for cough.  Objective:   Physical Exam  Constitutional: She is oriented to person, place, and time. She appears well-developed and well-nourished.  Anxious no acute distress afebrile  HENT:  Head: Normocephalic.  Right Ear: External ear normal.  Left Ear: External ear normal.  Mouth/Throat: Oropharynx is clear and moist.  Eyes: Conjunctivae and EOM are normal. Pupils are equal, round, and reactive to light.  Conjunctival injection  Neck: Normal range of motion. Neck supple. No thyromegaly present.  Cardiovascular: Normal rate, regular rhythm, normal heart sounds and intact distal pulses.   Pulmonary/Chest: Effort normal and breath sounds normal. No respiratory distress. She has no wheezes. She has no rales.  Abdominal: Soft. Bowel sounds are normal. She exhibits no mass. There is no tenderness.    Musculoskeletal: Normal range of motion.  Lymphadenopathy:    She has no cervical adenopathy.  Neurological: She is alert and oriented to person, place, and time.  Skin: Skin is warm and dry. No rash noted.  Psychiatric: She has a normal mood and affect. Her behavior is normal.          Assessment & Plan:   Flare allergic rhinitis.  Will treat with a short course of oral prednisone Continue maintenance medication Patient will attempt to avoid or minimize outdoor activities  Essential hypertension, stable  [name]

## 2016-11-04 ENCOUNTER — Encounter: Payer: Medicare Other | Admitting: Internal Medicine

## 2016-11-05 ENCOUNTER — Encounter: Payer: Medicare Other | Admitting: Internal Medicine

## 2016-11-16 ENCOUNTER — Encounter: Payer: Medicare Other | Admitting: Internal Medicine

## 2016-11-30 ENCOUNTER — Encounter: Payer: Self-pay | Admitting: Internal Medicine

## 2016-11-30 ENCOUNTER — Ambulatory Visit (INDEPENDENT_AMBULATORY_CARE_PROVIDER_SITE_OTHER): Payer: Medicare Other | Admitting: Internal Medicine

## 2016-11-30 VITALS — BP 144/78 | HR 66 | Temp 98.5°F | Ht 61.8 in | Wt 153.6 lb

## 2016-11-30 DIAGNOSIS — Z860100 Personal history of colon polyps, unspecified: Secondary | ICD-10-CM

## 2016-11-30 DIAGNOSIS — I10 Essential (primary) hypertension: Secondary | ICD-10-CM

## 2016-11-30 DIAGNOSIS — J302 Other seasonal allergic rhinitis: Secondary | ICD-10-CM

## 2016-11-30 DIAGNOSIS — Z0000 Encounter for general adult medical examination without abnormal findings: Secondary | ICD-10-CM | POA: Diagnosis not present

## 2016-11-30 MED ORDER — LORAZEPAM 0.5 MG PO TABS: ORAL_TABLET | ORAL | 2 refills | Status: DC

## 2016-11-30 NOTE — Patient Instructions (Addendum)
WE NOW OFFER   [facility] FAST TRACK!!!  SAME DAY Appointments for ACUTE CARE  Such as: Sprains, Injuries, cuts, abrasions, rashes, muscle pain, joint pain, back pain Colds, flu, sore throats, headache, allergies, cough, fever  Ear pain, sinus and eye infections Abdominal pain, nausea, vomiting, diarrhea, upset stomach Animal/insect bites  3 Easy Ways to Schedule: Walk-In Scheduling Call in scheduling Mychart Sign-up: [email or web address]   Limit your sodium (Salt) intake  Please check your blood pressure on a regular basis.  If it is consistently greater than 150/90, please make an office appointment.    It is important that you exercise regularly, at least 20 minutes 3 to 4 times per week.  If you develop chest pain or shortness of breath seek  medical attention.  Return in 6 months for follow-up

## 2016-11-30 NOTE — Progress Notes (Signed)
Subjective:    Patient ID: Kristy Becker, female    DOB: 1951/01/20, 66 y.o.   MRN: [id number]  HPI  66 year old patient who is seen today for a preventive health examination and subsequent Medicare wellness visit She is doing quite well.  Is followed by allergy medicine.  She is scheduled for follow-up colonoscopy next week.  She is followed by gynecology annually and is scheduled for follow-up exam including mammogram. She has essential hypertension.  Past Medical History:  Diagnosis Date  . ALLERGIC RHINITIS 10/06/2007  . ANXIETY 01/31/2007  . BACK PAIN 08/21/2007  . COLONIC POLYPS, HX OF 02/26/2009  . ECZEMA, HANDS 05/08/2007  . HYPERTENSION 01/31/2007     Social History   Social History  . Marital status: Married    Spouse name: N/A  . Number of children: N/A  . Years of education: N/A   Occupational History  . Not on file.   Social History Main Topics  . Smoking status: Never Smoker  . Smokeless tobacco: Never Used  . Alcohol use No  . Drug use: No  . Sexual activity: Not on file   Other Topics Concern  . Not on file   Social History Narrative  . No narrative on file    Past Surgical History:  Procedure Laterality Date  . TONSILLECTOMY AND ADENOIDECTOMY      No family history on file.  Allergies  Allergen Reactions  . Azithromycin     nausea  . Cephalosporins Other (See Comments)    unknown  . Clarithromycin Other (See Comments)    unknown  . Doxycycline Hyclate Other (See Comments)  . Penicillins Hives    Current Outpatient Prescriptions on File Prior to Visit  Medication Sig Dispense Refill  . benazepril-hydrochlorthiazide (LOTENSIN HCT) 20-12.5 MG tablet Take 1 tablet by mouth daily. 90 tablet 1  . fexofenadine (ALLEGRA) 180 MG tablet Take 180 mg by mouth daily as needed.  4  . miconazole (MICOTIN) 2 % cream Apply 1 application topically at bedtime.    . mometasone (ELOCON) 0.1 % cream APPLY TO AFFECTED AREA 2 TIMES DAILY  1  . MOMETASONE  FUROATE NA Place 2 sprays into the nose daily. 50 mcg    . Multiple Vitamin (MULTIVITAMIN WITH MINERALS) TABS Take 1 tablet by mouth daily.    . Olopatadine HCl 0.6 % SOLN Place 2 puffs into the nose as needed.    . pantoprazole (PROTONIX) 40 MG tablet Take 40 mg by mouth daily.    Marland Kitchen triamcinolone cream (KENALOG) 0.1 %   2  . Bepotastine Besilate (BEPREVE) 1.5 % SOLN Place 1 drop into both eyes as needed.     No current facility-administered medications on file prior to visit.     BP (!) 144/78 (BP Location: Left Arm, Patient Position: Sitting, Cuff Size: Normal)   Pulse 66   Temp 98.5 F (36.9 C) (Oral)   Ht 5' 1.8" (1.57 m)   Wt 153 lb 9.6 oz (69.7 kg)   SpO2 95%   BMI 28.28 kg/m   Subsequent Medicare wellness visit  1. Risk factors, based on past  M,S,F history.  Cardiovascular risk factors include a history of hypertension 2.  Physical activities:remains quite active.  Walks daily.  Does biking.  Exercises 2-3 times per week including light weight training  3.  Depression/mood:no history of major depression or mood disorder  4.  Hearing:no deficits  5.  ADL's:independent  6.  Fall risk:low  7.  Home safety:no  problems identified  8.  Height weight, and visual acuity;height and weight stable no change in visual acuity.  Did have an eye examination earlier this month  9.  Counseling:continue low-salt diet and home blood pressure monitoring  10. Lab orders based on risk factors:laboratory studies reviewed from the fall  11. Referral :follow GYN.  Screening colonoscopy scheduled  12. Care plan:continue efforts at aggressive risk factor modification  13. Cognitive assessment: alert in order with normal affect no cognitive dysfunction  14. Screening: Patient provided with a written and personalized 5-10 year screening schedule in the AVS.    15. Provider List Update: primary care GI OB/GYN ophthalmology as well as allergy medicine  16.  Advance directives.  Patient  states that she has living will and healthcare power of attorney all in place    Review of Systems  Constitutional: Negative.   HENT: Positive for congestion, postnasal drip and rhinorrhea. Negative for dental problem, hearing loss, sinus pressure, sore throat and tinnitus.   Eyes: Negative for pain, discharge and visual disturbance.  Respiratory: Negative for cough and shortness of breath.   Cardiovascular: Negative for chest pain, palpitations and leg swelling.  Gastrointestinal: Negative for abdominal distention, abdominal pain, blood in stool, constipation, diarrhea, nausea and vomiting.  Genitourinary: Negative for difficulty urinating, dysuria, flank pain, frequency, hematuria, pelvic pain, urgency, vaginal bleeding, vaginal discharge and vaginal pain.  Musculoskeletal: Negative for arthralgias, gait problem and joint swelling.  Skin: Negative for rash.  Neurological: Negative for dizziness, syncope, speech difficulty, weakness, numbness and headaches.  Hematological: Negative for adenopathy.  Psychiatric/Behavioral: Negative for agitation, behavioral problems and dysphoric mood. The patient is not nervous/anxious.        Objective:   Physical Exam  Constitutional: She is oriented to person, place, and time. She appears well-developed and well-nourished.  HENT:  Head: Normocephalic and atraumatic.  Right Ear: External ear normal.  Left Ear: External ear normal.  Mouth/Throat: Oropharynx is clear and moist.  Eyes: Conjunctivae and EOM are normal.  Neck: Normal range of motion. Neck supple. No JVD present. No thyromegaly present.  Cardiovascular: Normal rate, regular rhythm, normal heart sounds and intact distal pulses.   No murmur heard. Pulmonary/Chest: Effort normal and breath sounds normal. She has no wheezes. She has no rales.  Abdominal: Soft. Bowel sounds are normal. She exhibits no distension and no mass. There is no tenderness. There is no rebound and no guarding.    Musculoskeletal: Normal range of motion. She exhibits no edema or tenderness.  Neurological: She is alert and oriented to person, place, and time. She has normal reflexes. No cranial nerve deficit. She exhibits normal muscle tone. Coordination normal.  Skin: Skin is warm and dry. No rash noted.  Psychiatric: She has a normal mood and affect. Her behavior is normal.          Assessment & Plan:   Preventive health care Subsequent Medicare wellness visit Essential hypertension, well-controlled Allergic rhinitis, stable  Patient will have colonoscopy as scheduled Continue present blood pressure medications.  Continue low-salt diet and home blood pressure monitoring  Follow-up 6 months or as needed  [employer]

## 2016-12-07 ENCOUNTER — Observation Stay (HOSPITAL_COMMUNITY)
Admission: EM | Admit: 2016-12-07 | Discharge: 2016-12-08 | Disposition: A | Discharge status: Home / Self Care | Payer: Medicare Other | Attending: Internal Medicine | Admitting: Internal Medicine

## 2016-12-07 ENCOUNTER — Emergency Department (HOSPITAL_COMMUNITY): Payer: Medicare Other

## 2016-12-07 ENCOUNTER — Encounter (HOSPITAL_COMMUNITY): Payer: Self-pay

## 2016-12-07 DIAGNOSIS — N179 Acute kidney failure, unspecified: Secondary | ICD-10-CM | POA: Diagnosis present

## 2016-12-07 DIAGNOSIS — Z888 Allergy status to other drugs, medicaments and biological substances status: Secondary | ICD-10-CM | POA: Diagnosis not present

## 2016-12-07 DIAGNOSIS — F411 Generalized anxiety disorder: Secondary | ICD-10-CM | POA: Diagnosis not present

## 2016-12-07 DIAGNOSIS — M549 Dorsalgia, unspecified: Secondary | ICD-10-CM | POA: Diagnosis not present

## 2016-12-07 DIAGNOSIS — Z79899 Other long term (current) drug therapy: Secondary | ICD-10-CM | POA: Insufficient documentation

## 2016-12-07 DIAGNOSIS — Z881 Allergy status to other antibiotic agents status: Secondary | ICD-10-CM | POA: Diagnosis not present

## 2016-12-07 DIAGNOSIS — J329 Chronic sinusitis, unspecified: Secondary | ICD-10-CM | POA: Insufficient documentation

## 2016-12-07 DIAGNOSIS — I959 Hypotension, unspecified: Secondary | ICD-10-CM

## 2016-12-07 DIAGNOSIS — E860 Dehydration: Principal | ICD-10-CM | POA: Insufficient documentation

## 2016-12-07 DIAGNOSIS — R55 Syncope and collapse: Secondary | ICD-10-CM | POA: Diagnosis not present

## 2016-12-07 DIAGNOSIS — Z880 Allergy status to penicillin: Secondary | ICD-10-CM | POA: Insufficient documentation

## 2016-12-07 DIAGNOSIS — E871 Hypo-osmolality and hyponatremia: Secondary | ICD-10-CM | POA: Diagnosis present

## 2016-12-07 DIAGNOSIS — Z86010 Personal history of colonic polyps: Secondary | ICD-10-CM | POA: Insufficient documentation

## 2016-12-07 DIAGNOSIS — J3089 Other allergic rhinitis: Secondary | ICD-10-CM | POA: Diagnosis not present

## 2016-12-07 DIAGNOSIS — I951 Orthostatic hypotension: Secondary | ICD-10-CM | POA: Diagnosis present

## 2016-12-07 DIAGNOSIS — J309 Allergic rhinitis, unspecified: Secondary | ICD-10-CM | POA: Diagnosis present

## 2016-12-07 DIAGNOSIS — I10 Essential (primary) hypertension: Secondary | ICD-10-CM | POA: Insufficient documentation

## 2016-12-07 LAB — URINALYSIS, ROUTINE W REFLEX MICROSCOPIC
Bilirubin Urine: NEGATIVE
Glucose, UA: NEGATIVE mg/dL
Ketones, ur: NEGATIVE mg/dL
Leukocytes, UA: NEGATIVE
Nitrite: NEGATIVE
Protein, ur: NEGATIVE mg/dL
Specific Gravity, Urine: 1.004 — ABNORMAL LOW (ref 1.005–1.030)
pH: 6 (ref 5.0–8.0)

## 2016-12-07 LAB — BASIC METABOLIC PANEL
Anion gap: 11 (ref 5–15)
BUN: 20 mg/dL (ref 6–20)
CO2: 21 mmol/L — ABNORMAL LOW (ref 22–32)
Calcium: 8.6 mg/dL — ABNORMAL LOW (ref 8.9–10.3)
Chloride: 97 mmol/L — ABNORMAL LOW (ref 101–111)
Creatinine, Ser: 2.56 mg/dL — ABNORMAL HIGH (ref 0.44–1.00)
GFR calc Af Amer: 21 mL/min — ABNORMAL LOW (ref >60)
GFR calc non Af Amer: 18 mL/min — ABNORMAL LOW (ref >60)
Glucose, Bld: 130 mg/dL — ABNORMAL HIGH (ref 65–99)
Potassium: 4.3 mmol/L (ref 3.5–5.1)
Sodium: 129 mmol/L — ABNORMAL LOW (ref 135–145)

## 2016-12-07 LAB — CBC
HCT: 40.7 % (ref 36.0–46.0)
Hemoglobin: 13.7 g/dL (ref 12.0–15.0)
MCH: 29.3 pg (ref 26.0–34.0)
MCHC: 33.7 g/dL (ref 30.0–36.0)
MCV: 87.2 fL (ref 78.0–100.0)
Platelets: 155 10*3/uL (ref 150–400)
RBC: 4.67 MIL/uL (ref 3.87–5.11)
RDW: 12.8 % (ref 11.5–15.5)
WBC: 7.9 10*3/uL (ref 4.0–10.5)

## 2016-12-07 LAB — I-STAT TROPONIN, ED: TROPONIN I, POC: 0.03 ng/mL (ref 0.00–0.08)

## 2016-12-07 LAB — I-STAT CG4 LACTIC ACID, ED: Lactic Acid, Venous: 1.85 mmol/L (ref 0.5–1.9)

## 2016-12-07 LAB — CORTISOL: CORTISOL PLASMA: 15.1 ug/dL

## 2016-12-07 MED ORDER — SODIUM CHLORIDE 0.9 % IV BOLUS (SEPSIS)
1000.0000 mL | Freq: Once | INTRAVENOUS | Status: AC
  Administered 2016-12-07: 1000 mL via INTRAVENOUS

## 2016-12-07 MED ORDER — HEPARIN SODIUM (PORCINE) 5000 UNIT/ML IJ SOLN
5000.0000 [IU] | Freq: Three times a day (TID) | INTRAMUSCULAR | Status: DC
  Filled 2016-12-07: qty 1

## 2016-12-07 MED ORDER — ACETAMINOPHEN 325 MG PO TABS: 650.0000 mg | ORAL_TABLET | Freq: Four times a day (QID) | ORAL | Status: DC | PRN

## 2016-12-07 MED ORDER — PREDNISONE 20 MG PO TABS
40.0000 mg | ORAL_TABLET | Freq: Every day | ORAL | Status: DC
  Administered 2016-12-08: 40 mg via ORAL
  Filled 2016-12-07: qty 2

## 2016-12-07 MED ORDER — ACETAMINOPHEN 650 MG RE SUPP: 650.0000 mg | Freq: Four times a day (QID) | RECTAL | Status: DC | PRN

## 2016-12-07 MED ORDER — SODIUM CHLORIDE 0.9 % IV SOLN
INTRAVENOUS | Status: DC
  Administered 2016-12-07 – 2016-12-08 (×2): via INTRAVENOUS

## 2016-12-07 MED ORDER — SODIUM CHLORIDE 0.9 % IV SOLN: Freq: Once | INTRAVENOUS | Status: DC

## 2016-12-07 NOTE — ED Notes (Signed)
FALL RISK band and yellow socks placed on pt. 

## 2016-12-07 NOTE — ED Provider Notes (Signed)
[facility] DEPT Provider Note   CSN: [id number] Arrival date & time: 12/07/16  1330     History   Chief Complaint Chief Complaint  Patient presents with  . Loss of Consciousness    HPI Kristy Becker is a 66 y.o. female.  HPI Kristy Becker is a 66 y.o. female with history of allergic rhinitis, hypertension, eczema, presents to emergency department with sinus congestion and a syncopal episode. Patient states she has been experiencing sinus pressure, nasal congestion, headache for several days. She was seen by her family doctor yesterday and was started on steroids, as well as she is taking allergy medications, Flonase nasal spray, and is taking Bactrim which she has had since May. Patient had a synopsized as she was signing into ED. Remembers feeling dizzy, lightheaded, fell unconscious for about 10 sec. Pt continues to have left sided headache. States she has had some diarrhea. No vomiting. No chest pain. No SOB. No cough. No abdominal pain. No other complaints.   Past Medical History:  Diagnosis Date  . ALLERGIC RHINITIS 10/06/2007  . ANXIETY 01/31/2007  . BACK PAIN 08/21/2007  . COLONIC POLYPS, HX OF 02/26/2009  . ECZEMA, HANDS 05/08/2007  . HYPERTENSION 01/31/2007    Patient Active Problem List   Diagnosis Date Noted  . History of colonic polyps 02/26/2009  . Allergic rhinitis 10/06/2007  . Backache 08/21/2007  . ECZEMA, HANDS 05/08/2007  . Anxiety state 01/31/2007  . Essential hypertension 01/31/2007    Past Surgical History:  Procedure Laterality Date  . TONSILLECTOMY AND ADENOIDECTOMY      OB History    No data available       Home Medications    Prior to Admission medications   Medication Sig Start Date End Date Taking? Authorizing Provider  benazepril-hydrochlorthiazide (LOTENSIN HCT) 20-12.5 MG tablet Take 1 tablet by mouth daily. 10/06/16   Marletta Lor, MD  Bepotastine Besilate (BEPREVE) 1.5 % SOLN Place 1 drop into both eyes as needed.     [provider]  fexofenadine (ALLEGRA) 180 MG tablet Take 180 mg by mouth daily as needed. 07/11/15   [provider]  LORazepam (ATIVAN) 0.5 MG tablet TAKE 1 TABLET BY MOUTH TWICE A DAY AS NEEDED FOR ANXIETY 11/30/16   Marletta Lor, MD  miconazole (MICOTIN) 2 % cream Apply 1 application topically at bedtime.    [provider]  mometasone (ELOCON) 0.1 % cream APPLY TO AFFECTED AREA 2 TIMES DAILY 01/15/15   [provider]  MOMETASONE FUROATE NA Place 2 sprays into the nose daily. 50 mcg    [provider]  Multiple Vitamin (MULTIVITAMIN WITH MINERALS) TABS Take 1 tablet by mouth daily.    [provider]  Olopatadine HCl 0.6 % SOLN Place 2 puffs into the nose as needed.    [provider]  pantoprazole (PROTONIX) 40 MG tablet Take 40 mg by mouth daily.    [provider]  triamcinolone cream (KENALOG) 0.1 %  04/24/14   [provider]    Family History History reviewed. No pertinent family history.  Social History Social History  Substance Use Topics  . Smoking status: Never Smoker  . Smokeless tobacco: Never Used  . Alcohol use No     Allergies   Azithromycin; Cephalosporins; Clarithromycin; Doxycycline hyclate; and Penicillins   Review of Systems Review of Systems  Constitutional: Positive for fatigue. Negative for chills and fever.  HENT: Positive for congestion and sinus pain.   Respiratory: Negative  for cough, chest tightness and shortness of breath.   Cardiovascular: Negative for chest pain, palpitations and leg swelling.  Gastrointestinal: Positive for nausea. Negative for abdominal pain, diarrhea and vomiting.  Genitourinary: Negative for dysuria, flank pain and pelvic pain.  Musculoskeletal: Negative for arthralgias, myalgias, neck pain and neck stiffness.  Skin: Negative for rash.  Neurological: Positive for dizziness, syncope, weakness, light-headedness and headaches.  All other  systems reviewed and are negative.    Physical Exam Updated Vital Signs BP (!) 87/40   Pulse 69   Temp 97.3 F (36.3 C) (Oral)   Resp 19   Ht 5\' 1"  (1.549 m)   Wt 69.4 kg (153 lb)   SpO2 97%   BMI 28.91 kg/m   Physical Exam  Constitutional: She is oriented to person, place, and time. She appears well-developed and well-nourished. No distress.  HENT:  Head: Normocephalic.  Right Ear: External ear normal.  Left Ear: External ear normal.  Nose: Nose normal.  Mouth/Throat: Oropharynx is clear and moist.  Eyes: Conjunctivae and EOM are normal. Pupils are equal, round, and reactive to light.  Neck: Neck supple.  Cardiovascular: Normal rate, regular rhythm and normal heart sounds.   Pulmonary/Chest: Effort normal and breath sounds normal. No respiratory distress. She has no wheezes. She has no rales.  Abdominal: Soft. Bowel sounds are normal. She exhibits no distension. There is no tenderness. There is no rebound.  Musculoskeletal: She exhibits no edema.  Neurological: She is alert and oriented to person, place, and time.  Skin: Skin is warm and dry.  Psychiatric: She has a normal mood and affect. Her behavior is normal.  Nursing note and vitals reviewed.    ED Treatments / Results  Labs (all labs ordered are listed, but only abnormal results are displayed) Labs Reviewed  BASIC METABOLIC PANEL - Abnormal; Notable for the following:       Result Value   Sodium 129 (*)    Chloride 97 (*)    CO2 21 (*)    Glucose, Bld 130 (*)    Creatinine, Ser 2.56 (*)    Calcium 8.6 (*)    GFR calc non Af Amer 18 (*)    GFR calc Af Amer 21 (*)    All other components within normal limits  CBC  URINALYSIS, ROUTINE W REFLEX MICROSCOPIC  I-STAT TROPOININ, ED  I-STAT CG4 LACTIC ACID, ED  CBG MONITORING, ED  I-STAT CG4 LACTIC ACID, ED    EKG  EKG Interpretation  Date/Time:  Tuesday December 07 2016 13:41:09 EDT Ventricular Rate:  70 PR Interval:    QRS Duration: 77 QT  Interval:  374 QTC Calculation: 404 R Axis:   76 Text Interpretation:  Sinus rhythm Anteroseptal infarct, old Confirmed by Wilson Singer  MD, STEPHEN [phone]) on 12/07/2016 2:51:38 PM       Radiology Dg Chest 2 View  Result Date: 12/07/2016 CLINICAL DATA:  Syncope EXAM: CHEST  2 VIEW COMPARISON:  None. FINDINGS: No active infiltrate or effusion is seen. Mediastinal and hilar contours are unremarkable. The heart is within normal limits in size. No bony abnormality is seen. IMPRESSION: No active cardiopulmonary disease. Electronically Signed   By: Ivar Drape M.D.   On: 12/07/2016 15:20   Ct Head Wo Contrast  Result Date: 12/07/2016 CLINICAL DATA:  Syncopal episode today. EXAM: CT HEAD WITHOUT CONTRAST TECHNIQUE: Contiguous axial images were obtained from the base of the skull through the vertex without intravenous contrast. COMPARISON:  None. FINDINGS: Brain: No evidence of  malformation, atrophy, old or acute small or large vessel infarction, mass lesion, hemorrhage, hydrocephalus or extra-axial collection. No evidence of pituitary lesion. Vascular: There is atherosclerotic calcification of the major vessels at the base of the brain. Skull: Normal.  No fracture or focal bone lesion. Sinuses/Orbits: Visualized sinuses are clear. No fluid in the middle ears or mastoids. Visualized orbits are normal. Other: None significant IMPRESSION: Normal head CT, with the exception of some calcification of the major vessels at the base of the brain, commonly seen at this age. Electronically Signed   By: Nelson Chimes M.D.   On: 12/07/2016 15:35    Procedures Procedures (including critical care time)  Medications Ordered in ED Medications  0.9 %  sodium chloride infusion (not administered)  sodium chloride 0.9 % bolus 1,000 mL (0 mLs Intravenous Stopped 12/07/16 1430)  sodium chloride 0.9 % bolus 1,000 mL (0 mLs Intravenous Stopped 12/07/16 1622)  sodium chloride 0.9 % bolus 1,000 mL (1,000 mLs Intravenous New Bag/Given  12/07/16 1451)     Initial Impression / Assessment and Plan / ED Course  I have reviewed the triage vital signs and the nursing notes.  Pertinent labs & imaging results that were available during my care of the patient were reviewed by me and considered in my medical decision making (see chart for details).     Pt with sinus congestion, syncopal episode in waiting room. BP currently 80s. Pt looks dry. Will give fluids, labs pending, CT head.   4:05 PM BP improving with fluids and in high 90s at this point. She is feeling better. Labs show sodium of 129, creat of 2.56, her baseline is 1.2. Question dehydration? Will continue to hydrate. Will admit for fluids and monitor.   4:47 PM Spoke with triad, will admit.   Vitals:   12/07/16 1445 12/07/16 1515 12/07/16 1545 12/07/16 1630  BP: (!) 87/40 (!) 98/56 (!) 97/47 (!) 104/48  Pulse: 69 71 66 67  Resp: 19 14 16 18   Temp:      TempSrc:      SpO2: 97% 99% 99% 98%  Weight:      Height:         Final Clinical Impressions(s) / ED Diagnoses   Final diagnoses:  Syncope, unspecified syncope type  Hypotension, unspecified hypotension type  AKI (acute kidney injury) ([place])  Sinusitis, unspecified chronicity, unspecified location    New Prescriptions New Prescriptions   No medications on file     Jeannett Senior, Hershal Coria 12/07/16 1649    Virgel Manifold, MD 12/17/16 1906

## 2016-12-07 NOTE — ED Notes (Signed)
This RN was nurse first and witnessed patient have a syncopal episode while checking in. Pt was in the process of checking in while standing, became wobbly. Pts son was standing behind her, caught her and assisted her to the ground. Pt did not fall. Pts syncopal episode lasted about 5-10 seconds. Pt became lucid, A&OX4, answering questions but did not remember passing out. Pt was assisted into a wheelchair by this RN and her son and taken to trauma B to begin her care

## 2016-12-07 NOTE — H&P (Signed)
TRH H&P   Patient Demographics:    Kristy Becker, is a 66 y.o. female  MRN: [id number]   DOB - 1950/10/31  Admit Date - 12/07/2016  Outpatient Primary MD for the patient is Marletta Lor, MD  Referring MD: Dr. Wilson Singer  Outpatient Specialists: None  Patient coming from: Home  Chief Complaint  Patient presents with  . Loss of Consciousness      HPI:    Kristy Becker  is a 66 y.o. female,With hypertension, allergies rhinitis, anxiety who was being seen by her PCP for nasal congestion, rhinorrhea and postnasal drip. She was treated with a short course of prednisone about 6 weeks back. She she went to see her allergist yesterday who prescribed her Bactrim and Medrol but she hadn't started taking them yet. Patient presented to the ED today with ongoing sinus congestion with cough, rhinorrhea and itchy eyes. She also reports poor by mouth intake and hydration due to the symptoms and feeling fatigued.   Into the ED patient syncopized for about 10 seconds. She only remembers feeling dizzy, lightheaded. On waking up she was oriented but reported some left-sided headache. She denied any fevers, chills, nausea, vomiting, abdominal pain, chest pain, shortness of breath or palpitations. Reportedly having some loose diarrhea since this morning (2 episodes). No sick contacts or recent travel.   Course in the ED Patient was hypotensive with blood pressure of 81/45 mmHg. Remaining vitals were stable. Blood work showed normal CBC, sodium of 129, chloride of 97, CO2 21, BUN of 20 and creatinine of 2.56 (baseline creatinine normal). Blood glucose was 130. Lactic acid normal. EKG with normal sinus rhythm and no ST-T changes. Chest x-ray and head CT were unremarkable. Patient given 3 L normal saline bolus after which her blood pressure improved.  Hospitalist consulted for observation to  telemetry for orthostatic syncope and acute kidney injury.    Review of systems:    In addition to the HPI above,  No Fever-chills, Headache, blurred vision with he eyes, nasal congestion, postnasal drip, sinus pain No problems swallowing food or Liquids, No Chest pain, Cough or Shortness of Breath, No Abdominal pain, No Nausea or vomiting, occasional loose bowel movements No Blood in stool or Urine, No dysuria, No new skin rashes or bruises, No new joints pains-aches,  Generalized weakness, tingling, numbness in any extremity, No recent weight gain or loss, No polyuria, polydypsia or polyphagia, No significant Mental Stressors.  A full 10 point Review of Systems was done, except as stated above, all other Review of Systems were negative.   With Past History of the following :    Past Medical History:  Diagnosis Date  . ALLERGIC RHINITIS 10/06/2007  . ANXIETY 01/31/2007  . BACK PAIN 08/21/2007  . COLONIC POLYPS, HX OF 02/26/2009  . ECZEMA, HANDS 05/08/2007  . HYPERTENSION 01/31/2007      Past Surgical  History:  Procedure Laterality Date  . TONSILLECTOMY AND ADENOIDECTOMY        Social History:     Social History  Substance Use Topics  . Smoking status: Never Smoker  . Smokeless tobacco: Never Used  . Alcohol use No     Lives - At home  Mobility - independent   Family History :   No significant family history of heart disease, diabetes or hypertension.   Home Medications:   Prior to Admission medications   Medication Sig Start Date End Date Taking? Authorizing Provider  benazepril-hydrochlorthiazide (LOTENSIN HCT) 20-12.5 MG tablet Take 1 tablet by mouth daily. 10/06/16  Yes Marletta Lor, MD  Bepotastine Besilate (BEPREVE) 1.5 % SOLN Place 1 drop into both eyes as needed.   Yes [provider]  fexofenadine (ALLEGRA) 180 MG tablet Take 180 mg by mouth daily as needed. 07/11/15  Yes [provider]  methylPREDNISolone (MEDROL) 4 MG  tablet Take 4-12 mg by mouth daily. Prescribed 12-06-16. Have not started yet ( pt on taper dose )   Yes [provider]  miconazole (MICOTIN) 2 % cream Apply 1 application topically at bedtime.   Yes [provider]  mometasone (ELOCON) 0.1 % cream APPLY TO AFFECTED AREA 2 TIMES DAILY 01/15/15  Yes [provider]  MOMETASONE FUROATE NA Place 2 sprays into the nose daily. 50 mcg   Yes [provider]  Multiple Vitamin (MULTIVITAMIN WITH MINERALS) TABS Take 1 tablet by mouth daily.   Yes [provider]  Olopatadine HCl 0.6 % SOLN Place 2 puffs into the nose as needed.   Yes [provider]  pantoprazole (PROTONIX) 40 MG tablet Take 40 mg by mouth daily.   Yes [provider]  sulfamethoxazole-trimethoprim (BACTRIM DS,SEPTRA DS) 800-160 MG tablet Take 1 tablet by mouth 2 (two) times daily.   Yes [provider]  triamcinolone cream (KENALOG) 0.1 %  04/24/14  Yes [provider]  LORazepam (ATIVAN) 0.5 MG tablet TAKE 1 TABLET BY MOUTH TWICE A DAY AS NEEDED FOR ANXIETY Patient not taking: Reported on 12/07/2016 11/30/16   Marletta Lor, MD     Allergies:     Allergies  Allergen Reactions  . Azithromycin     nausea  . Cephalosporins Other (See Comments)    unknown  . Clarithromycin Other (See Comments)    unknown  . Doxycycline Hyclate Other (See Comments)  . Penicillins Hives     Physical Exam:   Vitals  Blood pressure (!) 104/48, pulse 67, temperature 97.3 F (36.3 C), temperature source Oral, resp. rate 18, height 5\' 1"  (1.549 m), weight 69.4 kg (153 lb), SpO2 98 %.    General Elderly female lying in bed, appears fatigued, not in distress HEENT: Mild conjunctival congestion, pupils reactive bilaterally, moist mucosa, supple neck, no cervical lymphadenopathy, no sinus tenderness Chest: Clear to auscultation bilaterally, no added sounds CVS: Normal S1 and S2, no murmurs or gallop GI: Soft,  nondistended, nontender, bowel sounds present Musculoskeletal: Warm, no edema  CNS: Alert with intact, nonfocal      Data Review:    CBC  Recent Labs Lab 12/07/16 1349  WBC 7.9  HGB 13.7  HCT 40.7  PLT 155  MCV 87.2  MCH 29.3  MCHC 33.7  RDW 12.8   ------------------------------------------------------------------------------------------------------------------  Chemistries   Recent Labs Lab 12/07/16 1349  NA 129*  K 4.3  CL 97*  CO2 21*  GLUCOSE 130*  BUN 20  CREATININE 2.56*  CALCIUM 8.6*   ------------------------------------------------------------------------------------------------------------------ estimated creatinine clearance is 19 mL/min (A) (by C-G formula based on SCr of 2.56 mg/dL (H)). ------------------------------------------------------------------------------------------------------------------ No results for input(s): TSH, T4TOTAL, T3FREE, THYROIDAB in the last 72 hours.  Invalid input(s): FREET3  Coagulation profile No results for input(s): INR, PROTIME in the last 168 hours. ------------------------------------------------------------------------------------------------------------------- No results for input(s): DDIMER in the last 72 hours. -------------------------------------------------------------------------------------------------------------------  Cardiac Enzymes No results for input(s): CKMB, TROPONINI, MYOGLOBIN in the last 168 hours.  Invalid input(s): CK ------------------------------------------------------------------------------------------------------------------ No results found for: BNP   ---------------------------------------------------------------------------------------------------------------  Urinalysis    Component Value Date/Time   COLORURINE yellow 02/20/2010 0748   APPEARANCEUR Clear 02/20/2010 0748   LABSPEC 1.025 02/20/2010 0748   PHURINE 6.0 02/20/2010 0748   GLUCOSEU NEGATIVE 09/29/2009 1426     HGBUR trace-intact 02/20/2010 0748   BILIRUBINUR n 10/28/2015 1103   KETONESUR NEGATIVE 09/29/2009 1426   PROTEINUR n 10/28/2015 1103   PROTEINUR NEGATIVE 09/29/2009 1426   UROBILINOGEN 0.2 10/28/2015 1103   UROBILINOGEN 0.2 02/20/2010 0748   NITRITE n 10/28/2015 1103   NITRITE negative 02/20/2010 0748   LEUKOCYTESUR Negative 10/28/2015 1103    ----------------------------------------------------------------------------------------------------------------   Imaging Results:    Dg Chest 2 View  Result Date: 12/07/2016 CLINICAL DATA:  Syncope EXAM: CHEST  2 VIEW COMPARISON:  None. FINDINGS: No active infiltrate or effusion is seen. Mediastinal and hilar contours are unremarkable. The heart is within normal limits in size. No bony abnormality is seen. IMPRESSION: No active cardiopulmonary disease. Electronically Signed   By: Ivar Drape M.D.   On: 12/07/2016 15:20   Ct Head Wo Contrast  Result Date: 12/07/2016 CLINICAL DATA:  Syncopal episode today. EXAM: CT HEAD WITHOUT CONTRAST TECHNIQUE: Contiguous axial images were obtained from the base of the skull through the vertex without intravenous contrast. COMPARISON:  None. FINDINGS: Brain: No evidence of malformation, atrophy, old or acute small or large vessel infarction, mass lesion, hemorrhage, hydrocephalus or extra-axial collection. No evidence of pituitary lesion. Vascular: There is atherosclerotic calcification of the major vessels at the base of the brain. Skull: Normal.  No fracture or focal bone lesion. Sinuses/Orbits: Visualized sinuses are clear. No fluid in the middle ears or mastoids. Visualized orbits are normal. Other: None significant IMPRESSION: Normal head CT, with the exception of some calcification of the major vessels at the base of the brain, commonly seen at this age. Electronically Signed   By: Nelson Chimes M.D.   On: 12/07/2016 15:35    My personal review of EKG: Independently reviewed, normal sinus rhythm at 70, no  ST-T changes.   Assessment & Plan:    Principal Problem:   Syncope and collapse Likely orthostatic with severe dehydration with poor by mouth intake. Received to relate IV normal saline bolus in the ED. EKG unremarkable and stable on telemetry. Blood pressure has started to improve. Hold blood pressure medications. Continue IV hydration with normal saline @1  25 mL per hour. Check random cortisol.  Active Problems: Allergies rhinitis Patient on Nasonex and Allegra at home. Switch Allegra to Claritin with patient in the hospital. Patient was ordered for a Medrol by PCP yesterday. I will place her on oral prednisone daily. No sinus tenderness on exam and head CT unremarkable for sinusitis. I will hold off on antibiotics.     Orthostatic hypotension Secondary to severe dehydration. Hold blood pressure medications. Monitor with IV fluids.    AKI (acute kidney injury) ([place]) Likely prerenal secondary to poor by mouth intake and further worsened by  concomitant use of Bactrim, HCTZ and ARB. All of and is held. Avoid NSAIDs. (Denies NSAID use on repeated questioning).  Monitor labs in a.m. with hydration.    Hyponatremia Secondary to dehydration. Monitor with IV hydration.       DVT Prophylaxis: sq heparin AM Labs Ordered, also please review Full Orders  Family Communication: Admission, patients condition and plan of care including tests being ordered have been discussed with the patient and her son at bedside .  Code Status full code  Likely DC to  home  Condition : fair  Consults called: none  Admission status: Observation  Time spent in minutes : 50   Louellen Molder M.D on 12/07/2016 at 4:49 PM  Between 7am to 7pm - Pager - [phone]. After 7pm go to [email or web address] - password [facility]  [facility] - Office  [phone]

## 2016-12-07 NOTE — ED Notes (Signed)
Kristy Becker notified of BP still 87/40, she will place orders for more fluids. Pt mentating normal, has no complaints at this time.

## 2016-12-07 NOTE — ED Triage Notes (Addendum)
Pt here for allergy problems and while checking in pt had syncopal episode and fell back into her son's arms. Pt did not hit floor or head. Pt came around and now alert and oriented x 4. Hypotensive at 85/46. EKG normal. Following all commands. Airway intact. Pt woke up at 0930 this morning and states her vision was off "it wasn't blurry but it wasn't clear and my right eye feels like pressure, but it's gone now" pt also went to allergy dr yesterday and was given medication for allergies.

## 2016-12-07 NOTE — ED Notes (Signed)
During Orthostatic vs pt states "i just still feel out of it" Denied dizziness or lightheadedness and was stable on her feet.

## 2016-12-07 NOTE — ED Notes (Signed)
Pt assisted to bedside commode, unable to give urine specimen at this time.

## 2016-12-07 NOTE — ED Notes (Signed)
Admitting MD at bedside.

## 2016-12-08 DIAGNOSIS — I959 Hypotension, unspecified: Secondary | ICD-10-CM

## 2016-12-08 DIAGNOSIS — J3089 Other allergic rhinitis: Secondary | ICD-10-CM | POA: Diagnosis not present

## 2016-12-08 DIAGNOSIS — J329 Chronic sinusitis, unspecified: Secondary | ICD-10-CM

## 2016-12-08 DIAGNOSIS — R55 Syncope and collapse: Secondary | ICD-10-CM | POA: Diagnosis not present

## 2016-12-08 DIAGNOSIS — N179 Acute kidney failure, unspecified: Secondary | ICD-10-CM | POA: Diagnosis not present

## 2016-12-08 LAB — BASIC METABOLIC PANEL
Anion gap: 5 (ref 5–15)
BUN: 11 mg/dL (ref 6–20)
CALCIUM: 7.7 mg/dL — AB (ref 8.9–10.3)
CO2: 21 mmol/L — AB (ref 22–32)
CREATININE: 1.03 mg/dL — AB (ref 0.44–1.00)
Chloride: 110 mmol/L (ref 101–111)
GFR calc non Af Amer: 55 mL/min — ABNORMAL LOW (ref >60)
GLUCOSE: 113 mg/dL — AB (ref 65–99)
Potassium: 4 mmol/L (ref 3.5–5.1)
Sodium: 136 mmol/L (ref 135–145)

## 2016-12-08 MED ORDER — ADULT MULTIVITAMIN W/MINERALS CH
1.0000 | ORAL_TABLET | Freq: Every day | ORAL | Status: DC
  Filled 2016-12-08: qty 1

## 2016-12-08 MED ORDER — BENAZEPRIL-HYDROCHLOROTHIAZIDE 20-12.5 MG PO TABS: 1.0000 | ORAL_TABLET | Freq: Every day | ORAL | 1 refills | Status: DC

## 2016-12-08 MED ORDER — PANTOPRAZOLE SODIUM 40 MG PO TBEC
40.0000 mg | DELAYED_RELEASE_TABLET | Freq: Every day | ORAL | Status: DC
  Filled 2016-12-08: qty 1

## 2016-12-08 MED ORDER — LORATADINE 10 MG PO TABS
10.0000 mg | ORAL_TABLET | Freq: Every day | ORAL | Status: DC
  Filled 2016-12-08: qty 1

## 2016-12-08 NOTE — Discharge Instructions (Signed)
Syncope Syncope is when you temporarily lose consciousness. Syncope may also be called fainting or passing out. It is caused by a sudden decrease in blood flow to the brain. Even though most causes of syncope are not dangerous, syncope can be a sign of a serious medical problem. Signs that you may be about to faint include:  Feeling dizzy or light-headed.  Feeling nauseous.  Seeing all white or all black in your field of vision.  Having cold, clammy skin.  If you fainted, get medical help right away.Call your local emergency services (911 in the [place] Do not drive yourself to the hospital. Follow these instructions at home: Pay attention to any changes in your symptoms. Take these actions to help with your condition:  Have someone stay with you until you feel stable.  Do not drive, use machinery, or play sports until your health care provider says it is okay.  Keep all follow-up visits as told by your health care provider. This is important.  If you start to feel like you might faint, lie down right away and raise (elevate) your feet above the level of your heart. Breathe deeply and steadily. Wait until all of the symptoms have passed.  Drink enough fluid to keep your urine clear or pale yellow.  If you are taking blood pressure or heart medicine, get up slowly and take several minutes to sit and then stand. This can reduce dizziness.  Take over-the-counter and prescription medicines only as told by your health care provider.  Get help right away if:  You have a severe headache.  You have unusual pain in your chest, abdomen, or back.  You are bleeding from your mouth or rectum, or you have black or tarry stool.  You have a very fast or irregular heartbeat (palpitations).  You have pain with breathing.  You faint once or repeatedly.  You have a seizure.  You are confused.  You have trouble walking.  You have severe weakness.  You have vision problems. These  symptoms may represent a serious problem that is an emergency. Do not wait to see if your symptoms will go away. Get medical help right away. Call your local emergency services (911 in the [place] Do not drive yourself to the hospital. This information is not intended to replace advice given to you by your health care provider. Make sure you discuss any questions you have with your health care provider. Document Released: 05/31/2005 Document Revised: 11/06/2015 Document Reviewed: 02/12/2015 Elsevier Interactive Patient Education  2017 [employer]   Dehydration, Adult Dehydration is a condition in which there is not enough fluid or water in the body. This happens when you lose more fluids than you take in. Important organs, such as the kidneys, brain, and heart, cannot function without a proper amount of fluids. Any loss of fluids from the body can lead to dehydration. Dehydration can range from mild to severe. This condition should be treated right away to prevent it from becoming severe. What are the causes? This condition may be caused by:  Vomiting.  Diarrhea.  Excessive sweating, such as from heat exposure or exercise.  Not drinking enough fluid, especially: ? When ill. ? While doing activity that requires a lot of energy.  Excessive urination.  Fever.  Infection.  Certain medicines, such as medicines that cause the body to lose excess fluid (diuretics).  Inability to access safe drinking water.  Reduced physical ability to get adequate water and food.  What increases the  risk? This condition is more likely to develop in people:  Who have a poorly controlled long-term (chronic) illness, such as diabetes, heart disease, or kidney disease.  Who are age 63 or older.  Who are disabled.  Who live in a place with high altitude.  Who play endurance sports.  What are the signs or symptoms? Symptoms of mild dehydration may include:  Thirst.  Dry lips.  Slightly dry  mouth.  Dry, warm skin.  Dizziness. Symptoms of moderate dehydration may include:  Very dry mouth.  Muscle cramps.  Dark urine. Urine may be the color of tea.  Decreased urine production.  Decreased tear production.  Heartbeat that is irregular or faster than normal (palpitations).  Headache.  Light-headedness, especially when you stand up from a sitting position.  Fainting (syncope). Symptoms of severe dehydration may include:  Changes in skin, such as: ? Cold and clammy skin. ? Blotchy (mottled) or pale skin. ? Skin that does not quickly return to normal after being lightly pinched and released (poor skin turgor).  Changes in body fluids, such as: ? Extreme thirst. ? No tear production. ? Inability to sweat when body temperature is high, such as in hot weather. ? Very little urine production.  Changes in vital signs, such as: ? Weak pulse. ? Pulse that is more than 100 beats a minute when sitting still. ? Rapid breathing. ? Low blood pressure.  Other changes, such as: ? Sunken eyes. ? Cold hands and feet. ? Confusion. ? Lack of energy (lethargy). ? Difficulty waking up from sleep. ? Short-term weight loss. ? Unconsciousness. How is this diagnosed? This condition is diagnosed based on your symptoms and a physical exam. Blood and urine tests may be done to help confirm the diagnosis. How is this treated? Treatment for this condition depends on the severity. Mild or moderate dehydration can often be treated at home. Treatment should be started right away. Do not wait until dehydration becomes severe. Severe dehydration is an emergency and it needs to be treated in a hospital. Treatment for mild dehydration may include:  Drinking more fluids.  Replacing salts and minerals in your blood (electrolytes) that you may have lost. Treatment for moderate dehydration may include:  Drinking an oral rehydration solution (ORS). This is a drink that helps you replace  fluids and electrolytes (rehydrate). It can be found at pharmacies and retail stores. Treatment for severe dehydration may include:  Receiving fluids through an IV tube.  Receiving an electrolyte solution through a feeding tube that is passed through your nose and into your stomach (nasogastric tube, or NG tube).  Correcting any abnormalities in electrolytes.  Treating the underlying cause of dehydration. Follow these instructions at home:  If directed by your health care provider, drink an ORS: ? Make an ORS by following instructions on the package. ? Start by drinking small amounts, about  cup (120 mL) every 5-10 minutes. ? Slowly increase how much you drink until you have taken the amount recommended by your health care provider.  Drink enough clear fluid to keep your urine clear or pale yellow. If you were told to drink an ORS, finish the ORS first, then start slowly drinking other clear fluids. Drink fluids such as: ? Water. Do not drink only water. Doing that can lead to having too little salt (sodium) in the body (hyponatremia). ? Ice chips. ? Fruit juice that you have added water to (diluted fruit juice). ? Low-calorie sports drinks.  Avoid: ? Alcohol. ?  Drinks that contain a lot of sugar. These include high-calorie sports drinks, fruit juice that is not diluted, and soda. ? Caffeine. ? Foods that are greasy or contain a lot of fat or sugar.  Take over-the-counter and prescription medicines only as told by your health care provider.  Do not take sodium tablets. This can lead to having too much sodium in the body (hypernatremia).  Eat foods that contain a healthy balance of electrolytes, such as bananas, oranges, potatoes, tomatoes, and spinach.  Keep all follow-up visits as told by your health care provider. This is important. Contact a health care provider if:  You have abdominal pain that: ? Gets worse. ? Stays in one area (localizes).  You have a rash.  You  have a stiff neck.  You are more irritable than usual.  You are sleepier or more difficult to wake up than usual.  You feel weak or dizzy.  You feel very thirsty.  You have urinated only a small amount of very dark urine over 6-8 hours. Get help right away if:  You have symptoms of severe dehydration.  You cannot drink fluids without vomiting.  Your symptoms get worse with treatment.  You have a fever.  You have a severe headache.  You have vomiting or diarrhea that: ? Gets worse. ? Does not go away.  You have blood or green matter (bile) in your vomit.  You have blood in your stool. This may cause stool to look black and tarry.  You have not urinated in 6-8 hours.  You faint.  Your heart rate while sitting still is over 100 beats a minute.  You have trouble breathing. This information is not intended to replace advice given to you by your health care provider. Make sure you discuss any questions you have with your health care provider. Document Released: 05/31/2005 Document Revised: 12/26/2015 Document Reviewed: 07/25/2015 Elsevier Interactive Patient Education  [employer].

## 2016-12-08 NOTE — Progress Notes (Signed)
Pt given discharge instructions and care notes. Pt verbalized understanding AEB no further questions or concerns at this time. IV was discontinued, no redness, pain, or swelling noted at this time. Telemetry discontinued and Centralized Telemetry was notified. Pt to leave the floor via wheelchair with staff in stable condition.

## 2016-12-08 NOTE — Care Management Obs Status (Signed)
[place] NOTIFICATION   Patient Details  Name: Kristy Becker MRN: [id number] Date of Birth: 1951-04-27   Medicare Observation Status Notification Given:  Yes    Sharin Mons, RN 12/08/2016, 1:36 PM

## 2016-12-08 NOTE — Discharge Summary (Signed)
Physician Discharge Summary  Kristy Becker:[id number] DOB: [birth year] DOA: 12/07/2016  PCP: Marletta Lor, MD  Admit date: 12/07/2016 Discharge date: 12/08/2016  Time spent: 40 minutes  Recommendations for Outpatient Follow-up:  Patient will be discharged to home.  Patient will need to follow up with primary care provider within one week of discharge, repeat BMP and discuss blood pressure and allergies.  Patient should continue medications as prescribed.  Patient should follow a heart healthy diet.   Discharge Diagnoses:  Syncope and collapse Allergic rhinitis Acute kidney injury Hyponatremia Essential hypertension  Discharge Condition: Stable  Diet recommendation: Heart healthy  Filed Weights   12/07/16 1347  Weight: 69.4 kg (153 lb)    History of present illness:  On 12/07/2016 by Dr. Flonnie Overman Dhungel Kristy Becker  is a 66 y.o. female,With hypertension, allergies rhinitis, anxiety who was being seen by her PCP for nasal congestion, rhinorrhea and postnasal drip. She was treated with a short course of prednisone about 6 weeks back. She she went to see her allergist yesterday who prescribed her Bactrim and Medrol but she hadn't started taking them yet. Patient presented to the ED today with ongoing sinus congestion with cough, rhinorrhea and itchy eyes. She also reports poor by mouth intake and hydration due to the symptoms and feeling fatigued.   Into the ED patient syncopized for about 10 seconds. She only remembers feeling dizzy, lightheaded. On waking up she was oriented but reported some left-sided headache. She denied any fevers, chills, nausea, vomiting, abdominal pain, chest pain, shortness of breath or palpitations. Reportedly having some loose diarrhea since this morning (2 episodes). No sick contacts or recent travel.  Hospital Course:  Syncope and collapse -Likely secondary to multifactorial causes including dehydration with orthostasis, poor oral  intake -Patient received IV fluids and has improved -Blood pressure has also improved and normalized -CT head was normal, exception of some calcification of major vessels at the base of the brain commonly seen at this age. -AM Cortisol 15.1, WNL- therefore likely hypotension not due to adrenal insufficiency  -Given patient's syncope, advised not to drive until cleared by her primary care physician  Allergic rhinitis -Patient uses Nasonex, Allegra -Feels allergy medications are not helping her. -Recently seen by PCP and placed on Medrol Dosepak, was also placed on antibiotics for sinusitis -CT head noted sinuses were clear, no fluid in the middle ears or mastoids. -Patient states she recently saw an allergist and had skin testing, is allergic to ragweed. Discussed wearing a mask while working in her yard to avoid certain allergens. -Have discontinued patient's antibiotics  Acute kidney injury -Likely secondary to dehydration, prerenal causes including poor oral intake worsened by use of Bactrim, Lotensin -Upon admission, creatinine 2.56, baseline creatinine appears to be 0.7 -Patient given IV fluids -Creatinine improved to 1.03 today -Recommend repeating BMP in 1 week  Hyponatremia -Likely secondary to dehydration -Sodium upon admission 129, has improved to 136 with IV fluids -Repeat BMP in 1 week -Discussed the need for hydration with water and Gatorade occasionally. Patient states she likes to work in her yard.  Essential hypertension -Would hold Lotensin until seen by PCP. Feel BP has been very controlled during hospitalization.   Procedures: none  Consultations: none  Discharge Exam: Vitals:   12/08/16 0530 12/08/16 0732  BP: (!) 104/48 (!) 108/58  Pulse: 69   Resp: 18   Temp: 99.2 F (37.3 C)    Patient states she is feeling better. Denies headache, visual changes, dizziness, chest  pain, shortness of breath, abdominal pain, nausea or vomiting.   General: Well  developed, well nourished, NAD, appears stated age  HEENT: NCAT, mucous membranes moist.  Cardiovascular: S1 S2 auscultated, no rubs, murmurs or gallops. Regular rate and rhythm.  Respiratory: Clear to auscultation bilaterally with equal chest rise  Abdomen: Soft, nontender, nondistended, + bowel sounds  Extremities: warm dry without cyanosis clubbing or edema  Neuro: AAOx3, cranial nerves grossly intact. Strength 5/5 in patient's upper and lower extremities bilaterally  Skin: Without rashes exudates or nodules  Psych: Normal affect and demeanor with intact judgement and insight  Discharge Instructions Discharge Instructions    Discharge instructions    Complete by:  As directed    Follow up with your primary care physician (PCP) in one week. Do not take your blood pressure medication until discussed with your doctor. Repeat your lab work (BMP). Continue to stay hydrated with water and Gatorade occasionally. Do not drive until evaluated and cleared by your primary care physician. Use a facial mask while in your garden to avoid allergens.     Current Discharge Medication List    CONTINUE these medications which have CHANGED   Details  benazepril-hydrochlorthiazide (LOTENSIN HCT) 20-12.5 MG tablet Take 1 tablet by mouth daily. Qty: 90 tablet, Refills: 1      CONTINUE these medications which have NOT CHANGED   Details  Bepotastine Besilate (BEPREVE) 1.5 % SOLN Place 1 drop into both eyes as needed.    fexofenadine (ALLEGRA) 180 MG tablet Take 180 mg by mouth daily as needed. Refills: 4    methylPREDNISolone (MEDROL) 4 MG tablet Take 4-12 mg by mouth daily. Prescribed 12-06-16. Have not started yet ( pt on taper dose )    miconazole (MICOTIN) 2 % cream Apply 1 application topically at bedtime.    mometasone (ELOCON) 0.1 % cream APPLY TO AFFECTED AREA 2 TIMES DAILY Refills: 1    MOMETASONE FUROATE NA Place 2 sprays into the nose daily. 50 mcg    Multiple Vitamin  (MULTIVITAMIN WITH MINERALS) TABS Take 1 tablet by mouth daily.    Olopatadine HCl 0.6 % SOLN Place 2 puffs into the nose as needed.    pantoprazole (PROTONIX) 40 MG tablet Take 40 mg by mouth daily.    triamcinolone cream (KENALOG) 0.1 % Refills: 2      STOP taking these medications     sulfamethoxazole-trimethoprim (BACTRIM DS,SEPTRA DS) 800-160 MG tablet      LORazepam (ATIVAN) 0.5 MG tablet        Allergies  Allergen Reactions  . Azithromycin     nausea  . Cephalosporins Other (See Comments)    unknown  . Clarithromycin Other (See Comments)    unknown  . Doxycycline Hyclate Other (See Comments)  . Penicillins Hives   Follow-up Information    Marletta Lor, MD. Schedule an appointment as soon as possible for a visit in 1 week(s).   Specialty:  Internal Medicine Why:  Hospital follow up Contact information: [place] [address] [phone]            The results of significant diagnostics from this hospitalization (including imaging, microbiology, ancillary and laboratory) are listed below for reference.    Significant Diagnostic Studies: Dg Chest 2 View  Result Date: 12/07/2016 CLINICAL DATA:  Syncope EXAM: CHEST  2 VIEW COMPARISON:  None. FINDINGS: No active infiltrate or effusion is seen. Mediastinal and hilar contours are unremarkable. The heart is within normal limits in size.  No bony abnormality is seen. IMPRESSION: No active cardiopulmonary disease. Electronically Signed   By: Ivar Drape M.D.   On: 12/07/2016 15:20   Ct Head Wo Contrast  Result Date: 12/07/2016 CLINICAL DATA:  Syncopal episode today. EXAM: CT HEAD WITHOUT CONTRAST TECHNIQUE: Contiguous axial images were obtained from the base of the skull through the vertex without intravenous contrast. COMPARISON:  None. FINDINGS: Brain: No evidence of malformation, atrophy, old or acute small or large vessel infarction, mass lesion, hemorrhage, hydrocephalus or  extra-axial collection. No evidence of pituitary lesion. Vascular: There is atherosclerotic calcification of the major vessels at the base of the brain. Skull: Normal.  No fracture or focal bone lesion. Sinuses/Orbits: Visualized sinuses are clear. No fluid in the middle ears or mastoids. Visualized orbits are normal. Other: None significant IMPRESSION: Normal head CT, with the exception of some calcification of the major vessels at the base of the brain, commonly seen at this age. Electronically Signed   By: Nelson Chimes M.D.   On: 12/07/2016 15:35    Microbiology: No results found for this or any previous visit (from the past 240 hour(s)).   Labs: Basic Metabolic Panel:  Recent Labs Lab 12/07/16 1349 12/08/16 0705  NA 129* 136  K 4.3 4.0  CL 97* 110  CO2 21* 21*  GLUCOSE 130* 113*  BUN 20 11  CREATININE 2.56* 1.03*  CALCIUM 8.6* 7.7*   Liver Function Tests: No results for input(s): AST, ALT, ALKPHOS, BILITOT, PROT, ALBUMIN in the last 168 hours. No results for input(s): LIPASE, AMYLASE in the last 168 hours. No results for input(s): AMMONIA in the last 168 hours. CBC:  Recent Labs Lab 12/07/16 1349  WBC 7.9  HGB 13.7  HCT 40.7  MCV 87.2  PLT 155   Cardiac Enzymes: No results for input(s): CKTOTAL, CKMB, CKMBINDEX, TROPONINI in the last 168 hours. BNP: BNP (last 3 results) No results for input(s): BNP in the last 8760 hours.  ProBNP (last 3 results) No results for input(s): PROBNP in the last 8760 hours.  CBG: No results for input(s): GLUCAP in the last 168 hours.     SignedCristal Ford  [facility] 12/08/2016, 12:57 PM

## 2016-12-09 ENCOUNTER — Telehealth: Payer: Self-pay | Admitting: *Deleted

## 2016-12-09 NOTE — Telephone Encounter (Signed)
D/C 12/08/16  DX: Syncopal and collapse  Transition Care Management Follow-up Telephone Call  How have you been since you were released from the hospital? "I have been doing really good. My sinuses have been bothering me a little bit, but nothing major"   Patient denies fever, headache, or any congestion.   Do you understand why you were in the hospital? "Yes, I had a fainting spell because I was dehydrated"    Do you understand the discharge instrcutions?  "Yes, they told me to hold my lotensin until I see Dr. Burnice Logan, also, I was told to stop bactrim and lorazepam. I know I wasn't eating and drinking well before I went, but I'm doing much better now"   Items Reviewed:  Medications reviewed: yes, per patient hospital MD advised and provided written instructions to hold lotensin until PCP appt.; patient's bactrim and lorazepam also d/c'd   Allergies reviewed: Yes, no changes  Dietary changes reviewed: yes, patient voiced understanding on proper diet and hydration Referrals reviewed: None   Functional Questionnaire:   Activities of Daily Living (ADLs):   She states they are independent in the following: ambulation, bathing and hygiene, feeding, continence, grooming, toileting and dressing States they require assistance with the following: none   Any transportation issues/concerns?:  Any patient concerns? Yes, patient is concerned that she was told to hold lotensin until follow up with Dr. Nonie Hoyer, she is worried that her blood pressure will get high. Patient is taking bp daily and reports normal readings. Encouraged patient to continue checking daily and notify MD office should bp readings start to rise.     Confirmed importance and date/time of follow-up visits scheduled: yes, patient has follow up with Dr. Burnice Logan (PCP) on 12/17/16 at 1230pm   Confirmed with patient if condition begins to worsen call PCP or go to the ER.  Patient was given the Call-a-Nurse line  [phone]: explained s/s to report to ems or pcp; patient voiced understanding to instructions.

## 2016-12-12 LAB — HM COLONOSCOPY

## 2016-12-14 ENCOUNTER — Telehealth: Payer: Self-pay

## 2016-12-14 NOTE — Telephone Encounter (Signed)
Please tell patient to resume the Lotensin and to follow-up in the office as scheduled

## 2016-12-14 NOTE — Telephone Encounter (Signed)
Spoke with pt and advised. She will start medication, monitor BP and bring in readings to appt on Friday. Nothing further needed at this time.

## 2016-12-14 NOTE — Telephone Encounter (Signed)
Patient called to report that she is having BP fluctuations. She has readings of: 187/88, HR 80 171/84, HR 91 164/90, HR 84 165/72, HR 86 She states that she was advised NOT to take Lotensin until she saw you in the office. She is concerned about her BP being high and would like to know what you recommend. She denies any other symptoms.   Dr. Raliegh Ip - Please advise. Thanks!

## 2016-12-17 ENCOUNTER — Ambulatory Visit (INDEPENDENT_AMBULATORY_CARE_PROVIDER_SITE_OTHER): Payer: Medicare Other | Admitting: Internal Medicine

## 2016-12-17 ENCOUNTER — Encounter: Payer: Self-pay | Admitting: Internal Medicine

## 2016-12-17 VITALS — BP 126/58 | HR 74 | Temp 98.2°F | Resp 18 | Wt 152.2 lb

## 2016-12-17 DIAGNOSIS — E871 Hypo-osmolality and hyponatremia: Secondary | ICD-10-CM | POA: Diagnosis not present

## 2016-12-17 DIAGNOSIS — I951 Orthostatic hypotension: Secondary | ICD-10-CM

## 2016-12-17 DIAGNOSIS — I10 Essential (primary) hypertension: Secondary | ICD-10-CM

## 2016-12-17 DIAGNOSIS — R55 Syncope and collapse: Secondary | ICD-10-CM | POA: Diagnosis not present

## 2016-12-17 DIAGNOSIS — N179 Acute kidney failure, unspecified: Secondary | ICD-10-CM

## 2016-12-17 LAB — BASIC METABOLIC PANEL
BUN: 13 mg/dL (ref 6–23)
CALCIUM: 9.7 mg/dL (ref 8.4–10.5)
CO2: 30 mEq/L (ref 19–32)
Chloride: 99 mEq/L (ref 96–112)
Creatinine, Ser: 0.86 mg/dL (ref 0.40–1.20)
GFR: 84.59 mL/min (ref >60.00)
Glucose, Bld: 107 mg/dL — ABNORMAL HIGH (ref 70–99)
Potassium: 3.8 mEq/L (ref 3.5–5.1)
SODIUM: 138 meq/L (ref 135–145)

## 2016-12-17 MED ORDER — BENAZEPRIL HCL 20 MG PO TABS: 20.0000 mg | ORAL_TABLET | Freq: Every day | ORAL | 3 refills | Status: DC

## 2016-12-17 NOTE — Progress Notes (Signed)
Subjective:    Patient ID: Kristy Becker, female    DOB: 1950/12/10, 66 y.o.   MRN: [id number]  HPI  Admit date: 12/07/2016 Discharge date: 12/08/2016   Recommendations for Outpatient Follow-up:  Patient will be discharged to home.  Patient will need to follow up with primary care provider within one week of discharge, repeat BMP and discuss blood pressure and allergies.  Patient should continue medications as prescribed.  Patient should follow a heart healthy diet.   Discharge Diagnoses:  Syncope and collapse Allergic rhinitis Acute kidney injury Hyponatremia Essential hypertension  66 year old patient who is seen following a recent hospital discharge.  She has history of allergies and was admitted with volume depletion, hypotension and syncope.  She responded well to IV fluids.  She had been spending considerable time outdoors with high ambient temperatures and humidity.  She was admitted with a creatinine at 2.56 to responded nicely to IV fluids. She had also been on diuretic therapy, Ace inhibition and Bactrim DS Since her discharge, her blood pressure started creeping up and due to persistent high readings, has resumed Lotensin HCT She generally feels fairly well but still weak and a bit unwell. Blood pressures have trended down nicely She has significant allergic rhinitis  Past Medical History:  Diagnosis Date  . ALLERGIC RHINITIS 10/06/2007  . ANXIETY 01/31/2007  . BACK PAIN 08/21/2007  . COLONIC POLYPS, HX OF 02/26/2009  . ECZEMA, HANDS 05/08/2007  . HYPERTENSION 01/31/2007     Social History   Social History  . Marital status: Married    Spouse name: N/A  . Number of children: N/A  . Years of education: N/A   Occupational History  . Not on file.   Social History Main Topics  . Smoking status: Never Smoker  . Smokeless tobacco: Never Used  . Alcohol use No  . Drug use: No  . Sexual activity: Not on file   Other Topics Concern  . Not on file   Social  History Narrative  . No narrative on file    Past Surgical History:  Procedure Laterality Date  . TONSILLECTOMY AND ADENOIDECTOMY      No family history on file.  Allergies  Allergen Reactions  . Azithromycin     nausea  . Cephalosporins Other (See Comments)    unknown  . Clarithromycin Other (See Comments)    unknown  . Doxycycline Hyclate Other (See Comments)  . Penicillins Hives    Current Outpatient Prescriptions on File Prior to Visit  Medication Sig Dispense Refill  . benazepril-hydrochlorthiazide (LOTENSIN HCT) 20-12.5 MG tablet Take 1 tablet by mouth daily. 90 tablet 1  . Bepotastine Besilate (BEPREVE) 1.5 % SOLN Place 1 drop into both eyes as needed.    . fexofenadine (ALLEGRA) 180 MG tablet Take 180 mg by mouth daily as needed.  4  . mometasone (ELOCON) 0.1 % cream APPLY TO AFFECTED AREA 2 TIMES DAILY  1  . MOMETASONE FUROATE NA Place 2 sprays into the nose daily. 50 mcg    . Multiple Vitamin (MULTIVITAMIN WITH MINERALS) TABS Take 1 tablet by mouth daily.    . Olopatadine HCl 0.6 % SOLN Place 2 puffs into the nose as needed.    . pantoprazole (PROTONIX) 40 MG tablet Take 40 mg by mouth daily.    Marland Kitchen triamcinolone cream (KENALOG) 0.1 %   2  . methylPREDNISolone (MEDROL) 4 MG tablet Take 4-12 mg by mouth daily. Prescribed 12-06-16. Have not started yet ( pt on  taper dose )    . miconazole (MICOTIN) 2 % cream Apply 1 application topically at bedtime.     No current facility-administered medications on file prior to visit.     BP (!) 126/58 (BP Location: Left Arm, Patient Position: Sitting, Cuff Size: Large)   Pulse 74   Temp 98.2 F (36.8 C) (Oral)   Resp 18   Wt 152 lb 3.2 oz (69 kg)   SpO2 98%   BMI 28.76 kg/m    Review of Systems  Constitutional: Positive for activity change.  HENT: Positive for congestion. Negative for dental problem, hearing loss, rhinorrhea, sinus pressure, sore throat and tinnitus.   Eyes: Negative for pain, discharge and visual  disturbance.  Respiratory: Negative for cough and shortness of breath.   Cardiovascular: Negative for chest pain, palpitations and leg swelling.  Gastrointestinal: Positive for diarrhea. Negative for abdominal distention, abdominal pain, blood in stool, constipation, nausea and vomiting.  Genitourinary: Negative for difficulty urinating, dysuria, flank pain, frequency, hematuria, pelvic pain, urgency, vaginal bleeding, vaginal discharge and vaginal pain.  Musculoskeletal: Negative for arthralgias, gait problem and joint swelling.  Skin: Negative for rash.  Neurological: Positive for weakness. Negative for dizziness, syncope, speech difficulty, numbness and headaches.  Hematological: Negative for adenopathy.  Psychiatric/Behavioral: Negative for agitation, behavioral problems and dysphoric mood. The patient is not nervous/anxious.        Objective:   Physical Exam  Constitutional: She is oriented to person, place, and time. She appears well-developed and well-nourished.  Blood pressure 140/70  HENT:  Head: Normocephalic.  Right Ear: External ear normal.  Left Ear: External ear normal.  Mouth/Throat: Oropharynx is clear and moist.  Eyes: Conjunctivae and EOM are normal. Pupils are equal, round, and reactive to light.  Neck: Normal range of motion. Neck supple. No thyromegaly present.  Cardiovascular: Normal rate, regular rhythm, normal heart sounds and intact distal pulses.   Pulmonary/Chest: Effort normal and breath sounds normal.  Abdominal: Soft. Bowel sounds are normal. She exhibits no mass. There is no tenderness.  Musculoskeletal: Normal range of motion.  Lymphadenopathy:    She has no cervical adenopathy.  Neurological: She is alert and oriented to person, place, and time.  Skin: Skin is warm and dry. No rash noted.  Psychiatric: She has a normal mood and affect. Her behavior is normal.          Assessment & Plan:   History of dehydration and syncope Essential  hypertension.  Will discontinue hydrochlorothiazide and continue benazepril.  Continue home blood pressure monitoring History of allergic rhinitis follow-up allergy medicine History of acute kidney injury.  We'll check renal indices and electrolytes Hyponatremia.  Probably secondary to volume depletion.  Will recheck sodium level  Recheck 4 weeks  [name]

## 2016-12-17 NOTE — Patient Instructions (Signed)
Drink as much fluid as you  can tolerate over the next few days  Discontinue hydrochlorothiazide  Please check your blood pressure on a regular basis.  If it is consistently greater than 150/90, please make an office appointment.  Return in one month for follow-up [phone]

## 2016-12-29 ENCOUNTER — Encounter: Payer: Self-pay | Admitting: Internal Medicine

## 2016-12-29 ENCOUNTER — Ambulatory Visit (INDEPENDENT_AMBULATORY_CARE_PROVIDER_SITE_OTHER): Payer: Medicare Other | Admitting: Internal Medicine

## 2016-12-29 VITALS — BP 158/82 | HR 78 | Temp 98.0°F | Ht 61.0 in | Wt 157.0 lb

## 2016-12-29 DIAGNOSIS — I10 Essential (primary) hypertension: Secondary | ICD-10-CM

## 2016-12-29 DIAGNOSIS — R002 Palpitations: Secondary | ICD-10-CM | POA: Diagnosis not present

## 2016-12-29 NOTE — Patient Instructions (Signed)
Report any recurrent palpitations, or any new symptoms Return as scheduled next month for follow-up  Limit your sodium (Salt) intake  Please check your blood pressure on a regular basis.  If it is consistently greater than 150/90, please make an office appointment.

## 2016-12-29 NOTE — Progress Notes (Signed)
Subjective:    Patient ID: Kristy Becker, female    DOB: 11-12-1950, 66 y.o.   MRN: [id number]  HPI  66 year old patient who is hospitalized briefly last month with syncope in the setting of volume depletion.  She has been very active and yesterday of participated in gym activities including riding her bike for 30 minutes.  She has been busy with outdoor yard work.  Odor.  Today she experienced a 15 minute episode of palpitations.  She described a rapid forceful heartbeat.  She had no associated symptoms such as shortness of breath, dizziness or weakness.  Denies any chest pain  Past Medical History:  Diagnosis Date  . ALLERGIC RHINITIS 10/06/2007  . ANXIETY 01/31/2007  . BACK PAIN 08/21/2007  . COLONIC POLYPS, HX OF 02/26/2009  . ECZEMA, HANDS 05/08/2007  . HYPERTENSION 01/31/2007     Social History   Social History  . Marital status: Married    Spouse name: N/A  . Number of children: N/A  . Years of education: N/A   Occupational History  . Not on file.   Social History Main Topics  . Smoking status: Never Smoker  . Smokeless tobacco: Never Used  . Alcohol use No  . Drug use: No  . Sexual activity: Not on file   Other Topics Concern  . Not on file   Social History Narrative  . No narrative on file    Past Surgical History:  Procedure Laterality Date  . TONSILLECTOMY AND ADENOIDECTOMY      No family history on file.  Allergies  Allergen Reactions  . Azithromycin     nausea  . Cephalosporins Other (See Comments)    unknown  . Clarithromycin Other (See Comments)    unknown  . Doxycycline Hyclate Other (See Comments)  . Penicillins Hives    Current Outpatient Prescriptions on File Prior to Visit  Medication Sig Dispense Refill  . benazepril (LOTENSIN) 20 MG tablet Take 1 tablet (20 mg total) by mouth daily. 90 tablet 3  . Bepotastine Besilate (BEPREVE) 1.5 % SOLN Place 1 drop into both eyes as needed.    . fexofenadine (ALLEGRA) 180 MG tablet Take 180 mg  by mouth daily as needed.  4  . methylPREDNISolone (MEDROL) 4 MG tablet Take 4-12 mg by mouth daily. Prescribed 12-06-16. Have not started yet ( pt on taper dose )    . miconazole (MICOTIN) 2 % cream Apply 1 application topically at bedtime.    . mometasone (ELOCON) 0.1 % cream APPLY TO AFFECTED AREA 2 TIMES DAILY  1  . MOMETASONE FUROATE NA Place 2 sprays into the nose daily. 50 mcg    . Multiple Vitamin (MULTIVITAMIN WITH MINERALS) TABS Take 1 tablet by mouth daily.    . Olopatadine HCl 0.6 % SOLN Place 2 puffs into the nose as needed.    . pantoprazole (PROTONIX) 40 MG tablet Take 40 mg by mouth daily.    Marland Kitchen triamcinolone cream (KENALOG) 0.1 %   2   No current facility-administered medications on file prior to visit.     BP (!) 158/82   Pulse 78   Temp 98 F (36.7 C)   Ht 5\' 1"  (1.549 m)   Wt 157 lb (71.2 kg)   SpO2 97%   BMI 29.66 kg/m     Review of Systems  Constitutional: Negative.   HENT: Negative for congestion, dental problem, hearing loss, rhinorrhea, sinus pressure, sore throat and tinnitus.   Eyes: Negative for pain,  discharge and visual disturbance.  Respiratory: Negative for cough and shortness of breath.   Cardiovascular: Positive for palpitations. Negative for chest pain and leg swelling.  Gastrointestinal: Negative for abdominal distention, abdominal pain, blood in stool, constipation, diarrhea, nausea and vomiting.  Genitourinary: Negative for difficulty urinating, dysuria, flank pain, frequency, hematuria, pelvic pain, urgency, vaginal bleeding, vaginal discharge and vaginal pain.  Musculoskeletal: Negative for arthralgias, gait problem and joint swelling.  Skin: Negative for rash.  Neurological: Negative for dizziness, syncope, speech difficulty, weakness, numbness and headaches.  Hematological: Negative for adenopathy.  Psychiatric/Behavioral: Negative for agitation, behavioral problems and dysphoric mood. The patient is not nervous/anxious.          Objective:   Physical Exam  Constitutional: She is oriented to person, place, and time. She appears well-developed and well-nourished.  Blood pressure 140/80 Slightly anxious but no distress  HENT:  Head: Normocephalic.  Right Ear: External ear normal.  Left Ear: External ear normal.  Mouth/Throat: Oropharynx is clear and moist.  Eyes: Pupils are equal, round, and reactive to light. Conjunctivae and EOM are normal.  Neck: Normal range of motion. Neck supple. No thyromegaly present.  Cardiovascular: Normal rate, regular rhythm, normal heart sounds and intact distal pulses.   Rhythm is mid seventies and regular  Pulmonary/Chest: Effort normal and breath sounds normal.  Abdominal: Soft. Bowel sounds are normal. She exhibits no mass. There is no tenderness.  Musculoskeletal: Normal range of motion. She exhibits edema.  Trace pedal edema  Lymphadenopathy:    She has no cervical adenopathy.  Neurological: She is alert and oriented to person, place, and time.  Skin: Skin is warm and dry. No rash noted.  Psychiatric: She has a normal mood and affect. Her behavior is normal.          Assessment & Plan:   Single episode of palpitations.  Options discussed.  Will observe at this time.  The patient did have EKGs performed last month and was not repeated today.  If symptoms recur, will consider event monitor and 2-D echocardiogram Hypertension, stable no change in medical regimen  She does take allergy medications, but no decongestants.  Her caffeine load is very modest.  She report any new symptoms, otherwise return next month for general follow-up  [name]

## 2017-01-17 ENCOUNTER — Encounter: Payer: Self-pay | Admitting: Internal Medicine

## 2017-01-17 ENCOUNTER — Ambulatory Visit (INDEPENDENT_AMBULATORY_CARE_PROVIDER_SITE_OTHER): Payer: Medicare Other | Admitting: Internal Medicine

## 2017-01-17 VITALS — BP 140/70 | HR 66 | Temp 98.1°F | Ht 61.0 in | Wt 159.0 lb

## 2017-01-17 DIAGNOSIS — I10 Essential (primary) hypertension: Secondary | ICD-10-CM

## 2017-01-17 DIAGNOSIS — I951 Orthostatic hypotension: Secondary | ICD-10-CM | POA: Diagnosis not present

## 2017-01-17 NOTE — Patient Instructions (Signed)
Limit your sodium (Salt) intake  Please check your blood pressure on a regular basis.  If it is consistently greater than 150/90, please make an office appointment.  Return in 6 months for follow-up   

## 2017-01-17 NOTE — Progress Notes (Signed)
Subjective:    Patient ID: Kristy Becker, female    DOB: 25-Feb-1951, 66 y.o.   MRN: [id number]  HPI  66 year old patient who has a history of essential hypertension.  She was evaluated in the ED about 1 month ago after a syncopal episode, felt related to orthostatic hypotension.  She was fairly at that time but did give a history of an episode of nonsustained palpitations.  She has had no recurrent symptoms and in general doing quite well today.  Past Medical History:  Diagnosis Date  . ALLERGIC RHINITIS 10/06/2007  . ANXIETY 01/31/2007  . BACK PAIN 08/21/2007  . COLONIC POLYPS, HX OF 02/26/2009  . ECZEMA, HANDS 05/08/2007  . HYPERTENSION 01/31/2007     Social History   Social History  . Marital status: Married    Spouse name: N/A  . Number of children: N/A  . Years of education: N/A   Occupational History  . Not on file.   Social History Main Topics  . Smoking status: Never Smoker  . Smokeless tobacco: Never Used  . Alcohol use No  . Drug use: No  . Sexual activity: Not on file   Other Topics Concern  . Not on file   Social History Narrative  . No narrative on file    Past Surgical History:  Procedure Laterality Date  . TONSILLECTOMY AND ADENOIDECTOMY      No family history on file.  Allergies  Allergen Reactions  . Azithromycin     nausea  . Cephalosporins Other (See Comments)    unknown  . Clarithromycin Other (See Comments)    unknown  . Doxycycline Hyclate Other (See Comments)  . Penicillins Hives    Current Outpatient Prescriptions on File Prior to Visit  Medication Sig Dispense Refill  . benazepril (LOTENSIN) 20 MG tablet Take 1 tablet (20 mg total) by mouth daily. 90 tablet 3  . Bepotastine Besilate (BEPREVE) 1.5 % SOLN Place 1 drop into both eyes as needed.    . fexofenadine (ALLEGRA) 180 MG tablet Take 180 mg by mouth daily as needed.  4  . methylPREDNISolone (MEDROL) 4 MG tablet Take 4-12 mg by mouth daily. Prescribed 12-06-16. Have not  started yet ( pt on taper dose )    . miconazole (MICOTIN) 2 % cream Apply 1 application topically at bedtime.    . mometasone (ELOCON) 0.1 % cream APPLY TO AFFECTED AREA 2 TIMES DAILY  1  . MOMETASONE FUROATE NA Place 2 sprays into the nose daily. 50 mcg    . Multiple Vitamin (MULTIVITAMIN WITH MINERALS) TABS Take 1 tablet by mouth daily.    . Olopatadine HCl 0.6 % SOLN Place 2 puffs into the nose as needed.    . pantoprazole (PROTONIX) 40 MG tablet Take 40 mg by mouth daily.    Marland Kitchen triamcinolone cream (KENALOG) 0.1 %   2   No current facility-administered medications on file prior to visit.     BP 140/70 (BP Location: Left Arm, Patient Position: Sitting, Cuff Size: Normal)   Pulse 66   Temp 98.1 F (36.7 C) (Oral)   Ht 5\' 1"  (1.549 m)   Wt 159 lb (72.1 kg)   SpO2 98%   BMI 30.04 kg/m     Review of Systems  Constitutional: Negative.   HENT: Negative for congestion, dental problem, hearing loss, rhinorrhea, sinus pressure, sore throat and tinnitus.   Eyes: Negative for pain, discharge and visual disturbance.  Respiratory: Negative for cough and shortness of  breath.   Cardiovascular: Negative for chest pain, palpitations and leg swelling.  Gastrointestinal: Negative for abdominal distention, abdominal pain, blood in stool, constipation, diarrhea, nausea and vomiting.  Genitourinary: Negative for difficulty urinating, dysuria, flank pain, frequency, hematuria, pelvic pain, urgency, vaginal bleeding, vaginal discharge and vaginal pain.  Musculoskeletal: Negative for arthralgias, gait problem and joint swelling.  Skin: Negative for rash.  Neurological: Negative for dizziness, syncope, speech difficulty, weakness, numbness and headaches.  Hematological: Negative for adenopathy.  Psychiatric/Behavioral: Negative for agitation, behavioral problems and dysphoric mood. The patient is nervous/anxious.        Objective:   Physical Exam  Constitutional: She is oriented to person, place,  and time. She appears well-developed and well-nourished.  HENT:  Head: Normocephalic.  Right Ear: External ear normal.  Left Ear: External ear normal.  Mouth/Throat: Oropharynx is clear and moist.  Eyes: Pupils are equal, round, and reactive to light. Conjunctivae and EOM are normal.  Neck: Normal range of motion. Neck supple. No thyromegaly present.  Cardiovascular: Normal rate, regular rhythm and intact distal pulses.   Murmur heard. Rhythm is regular without ectopics  Pulmonary/Chest: Effort normal and breath sounds normal.  Abdominal: Soft. Bowel sounds are normal. She exhibits no mass. There is no tenderness.  Musculoskeletal: Normal range of motion.  Lymphadenopathy:    She has no cervical adenopathy.  Neurological: She is alert and oriented to person, place, and time.  Skin: Skin is warm and dry. No rash noted.  Psychiatric: She has a normal mood and affect. Her behavior is normal.          Assessment & Plan:   History palpitations.  Stable Essential hypertension, controlled  No change in medical therapy Follow-up 6 months or as needed  [employer]

## 2017-01-25 ENCOUNTER — Ambulatory Visit: Payer: Medicare Other | Admitting: Internal Medicine

## 2017-01-25 ENCOUNTER — Encounter: Payer: Self-pay | Admitting: Internal Medicine

## 2017-01-25 ENCOUNTER — Telehealth: Payer: Self-pay | Admitting: Internal Medicine

## 2017-01-25 ENCOUNTER — Ambulatory Visit (INDEPENDENT_AMBULATORY_CARE_PROVIDER_SITE_OTHER): Payer: Medicare Other | Admitting: Internal Medicine

## 2017-01-25 VITALS — BP 182/90 | HR 80 | Temp 97.9°F | Ht 61.0 in | Wt 155.0 lb

## 2017-01-25 DIAGNOSIS — I10 Essential (primary) hypertension: Secondary | ICD-10-CM | POA: Diagnosis not present

## 2017-01-25 DIAGNOSIS — I951 Orthostatic hypotension: Secondary | ICD-10-CM

## 2017-01-25 MED ORDER — AMLODIPINE BESYLATE 2.5 MG PO TABS: 2.5000 mg | ORAL_TABLET | Freq: Every day | ORAL | 3 refills | Status: DC

## 2017-01-25 NOTE — Patient Instructions (Addendum)
Limit your sodium (Salt) intake  Amlodipine 2.5 milligrams daily  Please check your blood pressure on a regular basis.  If it is consistently greater than 150/90, please make an office appointment.  Return in 4 weeks for follow-up   DASH Eating Plan DASH stands for "Dietary Approaches to Stop Hypertension." The DASH eating plan is a healthy eating plan that has been shown to reduce high blood pressure (hypertension). It may also reduce your risk for type 2 diabetes, heart disease, and stroke. The DASH eating plan may also help with weight loss. What are tips for following this plan? General guidelines  Avoid eating more than 2,300 mg (milligrams) of salt (sodium) a day. If you have hypertension, you may need to reduce your sodium intake to 1,500 mg a day.  Limit alcohol intake to no more than 1 drink a day for nonpregnant women and 2 drinks a day for men. One drink equals 12 oz of beer, 5 oz of wine, or 1 oz of hard liquor.  Work with your health care provider to maintain a healthy body weight or to lose weight. Ask what an ideal weight is for you.  Get at least 30 minutes of exercise that causes your heart to beat faster (aerobic exercise) most days of the week. Activities may include walking, swimming, or biking.  Work with your health care provider or diet and nutrition specialist (dietitian) to adjust your eating plan to your individual calorie needs. Reading food labels  Check food labels for the amount of sodium per serving. Choose foods with less than 5 percent of the Daily Value of sodium. Generally, foods with less than 300 mg of sodium per serving fit into this eating plan.  To find whole grains, look for the word "whole" as the first word in the ingredient list. Shopping  Buy products labeled as "low-sodium" or "no salt added."  Buy fresh foods. Avoid canned foods and premade or frozen meals. Cooking  Avoid adding salt when cooking. Use salt-free seasonings or herbs  instead of table salt or sea salt. Check with your health care provider or pharmacist before using salt substitutes.  Do not fry foods. Cook foods using healthy methods such as baking, boiling, grilling, and broiling instead.  Cook with heart-healthy oils, such as olive, canola, soybean, or sunflower oil. Meal planning   Eat a balanced diet that includes: ? 5 or more servings of fruits and vegetables each day. At each meal, try to fill half of your plate with fruits and vegetables. ? Up to 6-8 servings of whole grains each day. ? Less than 6 oz of lean meat, poultry, or fish each day. A 3-oz serving of meat is about the same size as a deck of cards. One egg equals 1 oz. ? 2 servings of low-fat dairy each day. ? A serving of nuts, seeds, or beans 5 times each week. ? Heart-healthy fats. Healthy fats called Omega-3 fatty acids are found in foods such as flaxseeds and coldwater fish, like sardines, salmon, and mackerel.  Limit how much you eat of the following: ? Canned or prepackaged foods. ? Food that is high in trans fat, such as fried foods. ? Food that is high in saturated fat, such as fatty meat. ? Sweets, desserts, sugary drinks, and other foods with added sugar. ? Full-fat dairy products.  Do not salt foods before eating.  Try to eat at least 2 vegetarian meals each week.  Eat more home-cooked food and less restaurant, buffet,  and fast food.  When eating at a restaurant, ask that your food be prepared with less salt or no salt, if possible. What foods are recommended? The items listed may not be a complete list. Talk with your dietitian about what dietary choices are best for you. Grains Whole-grain or whole-wheat bread. Whole-grain or whole-wheat pasta. Brown rice. [place]row. Bulgur. Whole-grain and low-sodium cereals. Pita bread. Low-fat, low-sodium crackers. Whole-wheat flour tortillas. Vegetables Fresh or frozen vegetables (raw, steamed, roasted, or grilled).  Low-sodium or reduced-sodium tomato and vegetable juice. Low-sodium or reduced-sodium tomato sauce and tomato paste. Low-sodium or reduced-sodium canned vegetables. Fruits All fresh, dried, or frozen fruit. Canned fruit in natural juice (without added sugar). Meat and other protein foods Skinless chicken or [place]. Ground chicken or [place]. Pork with fat trimmed off. Fish and seafood. Egg whites. Dried beans, peas, or lentils. Unsalted nuts, nut butters, and seeds. Unsalted canned beans. Lean cuts of beef with fat trimmed off. Low-sodium, lean deli meat. Dairy Low-fat (1%) or fat-free (skim) milk. Fat-free, low-fat, or reduced-fat cheeses. Nonfat, low-sodium ricotta or cottage cheese. Low-fat or nonfat yogurt. Low-fat, low-sodium cheese. Fats and oils Soft margarine without trans fats. Vegetable oil. Low-fat, reduced-fat, or light mayonnaise and salad dressings (reduced-sodium). Canola, safflower, olive, soybean, and sunflower oils. Avocado. Seasoning and other foods Herbs. Spices. Seasoning mixes without salt. Unsalted popcorn and pretzels. Fat-free sweets. What foods are not recommended? The items listed may not be a complete list. Talk with your dietitian about what dietary choices are best for you. Grains Baked goods made with fat, such as croissants, muffins, or some breads. Dry pasta or rice meal packs. Vegetables Creamed or fried vegetables. Vegetables in a cheese sauce. Regular canned vegetables (not low-sodium or reduced-sodium). Regular canned tomato sauce and paste (not low-sodium or reduced-sodium). Regular tomato and vegetable juice (not low-sodium or reduced-sodium). Angie Fava. Olives. Fruits Canned fruit in a light or heavy syrup. Fried fruit. Fruit in cream or butter sauce. Meat and other protein foods Fatty cuts of meat. Ribs. Fried meat. Berniece Salines. Sausage. Bologna and other processed lunch meats. Salami. Fatback. Hotdogs. Bratwurst. Salted nuts and seeds. Canned beans with added  salt. Canned or smoked fish. Whole eggs or egg yolks. Chicken or [place] with skin. Dairy Whole or 2% milk, cream, and half-and-half. Whole or full-fat cream cheese. Whole-fat or sweetened yogurt. Full-fat cheese. Nondairy creamers. Whipped toppings. Processed cheese and cheese spreads. Fats and oils Butter. Stick margarine. Lard. Shortening. Ghee. Bacon fat. Tropical oils, such as coconut, palm kernel, or palm oil. Seasoning and other foods Salted popcorn and pretzels. Onion salt, garlic salt, seasoned salt, table salt, and sea salt. Worcestershire sauce. Tartar sauce. Barbecue sauce. Teriyaki sauce. Soy sauce, including reduced-sodium. Steak sauce. Canned and packaged gravies. Fish sauce. Oyster sauce. Cocktail sauce. Horseradish that you find on the shelf. Ketchup. Mustard. Meat flavorings and tenderizers. Bouillon cubes. Hot sauce and Tabasco sauce. Premade or packaged marinades. Premade or packaged taco seasonings. Relishes. Regular salad dressings. Where to find more information:  National Heart, Lung, and [place]: https://wilson-eaton.com/  American Heart Association: www.heart.org Summary  The DASH eating plan is a healthy eating plan that has been shown to reduce high blood pressure (hypertension). It may also reduce your risk for type 2 diabetes, heart disease, and stroke.  With the DASH eating plan, you should limit salt (sodium) intake to 2,300 mg a day. If you have hypertension, you may need to reduce your sodium intake to 1,500 mg a day.  When on  the DASH eating plan, aim to eat more fresh fruits and vegetables, whole grains, lean proteins, low-fat dairy, and heart-healthy fats.  Work with your health care provider or diet and nutrition specialist (dietitian) to adjust your eating plan to your individual calorie needs. This information is not intended to replace advice given to you by your health care provider. Make sure you discuss any questions you have with your health care  provider. Document Released: 05/20/2011 Document Revised: 05/24/2016 Document Reviewed: 05/24/2016 Elsevier Interactive Patient Education  2017 [employer].

## 2017-01-25 NOTE — Telephone Encounter (Signed)
Pt would like to know if she should be taking both Bp medications and the baby aspirin in the A.M. Tomorrow?  Would like to have a call back today please.

## 2017-01-25 NOTE — Telephone Encounter (Signed)
Left message to take 3 medications together. Will call back tomorrow

## 2017-01-25 NOTE — Telephone Encounter (Signed)
Yes.  Okay to take all 3 medications in the morning together

## 2017-01-25 NOTE — Progress Notes (Signed)
Subjective:    Patient ID: Kristy Becker, female    DOB: Jul 18, 1950, 66 y.o.   MRN: [id number]  HPI  66 year old patient who has a long history of essential hypertension.  Previously had been treated with combination therapy that included hydrochlorothiazide.  This was discontinued a few months ago when she was admitted hospital with syncope, thought related to orthostatic hypotension.  She was volume contracted at that time.  She states that blood pressure has been increasing since 4 PM yesterday.  She was seen by GI earlier with elevated blood pressure and referred to our office. Blood pressure on arrival 182 over 90.  Repeat blood pressure still 160 over 90.  She generally feels well  Past Medical History:  Diagnosis Date  . ALLERGIC RHINITIS 10/06/2007  . ANXIETY 01/31/2007  . BACK PAIN 08/21/2007  . COLONIC POLYPS, HX OF 02/26/2009  . ECZEMA, HANDS 05/08/2007  . HYPERTENSION 01/31/2007     Social History   Social History  . Marital status: Married    Spouse name: N/A  . Number of children: N/A  . Years of education: N/A   Occupational History  . Not on file.   Social History Main Topics  . Smoking status: Never Smoker  . Smokeless tobacco: Never Used  . Alcohol use No  . Drug use: No  . Sexual activity: Not on file   Other Topics Concern  . Not on file   Social History Narrative  . No narrative on file    Past Surgical History:  Procedure Laterality Date  . TONSILLECTOMY AND ADENOIDECTOMY      No family history on file.  Allergies  Allergen Reactions  . Azithromycin     nausea  . Cephalosporins Other (See Comments)    unknown  . Clarithromycin Other (See Comments)    unknown  . Doxycycline Hyclate Other (See Comments)  . Penicillins Hives    Current Outpatient Prescriptions on File Prior to Visit  Medication Sig Dispense Refill  . benazepril (LOTENSIN) 20 MG tablet Take 1 tablet (20 mg total) by mouth daily. 90 tablet 3  . Bepotastine Besilate  (BEPREVE) 1.5 % SOLN Place 1 drop into both eyes as needed.    . fexofenadine (ALLEGRA) 180 MG tablet Take 180 mg by mouth daily as needed.  4  . MOMETASONE FUROATE NA Place 2 sprays into the nose daily. 50 mcg    . Multiple Vitamin (MULTIVITAMIN WITH MINERALS) TABS Take 1 tablet by mouth daily.    . Olopatadine HCl 0.6 % SOLN Place 2 puffs into the nose as needed.    . pantoprazole (PROTONIX) 40 MG tablet Take 40 mg by mouth daily.     No current facility-administered medications on file prior to visit.     BP (!) 182/90 (BP Location: Left Arm, Patient Position: Sitting, Cuff Size: Normal)   Pulse 80   Temp 97.9 F (36.6 C) (Oral)   Ht 5\' 1"  (1.549 m)   Wt 155 lb (70.3 kg)   SpO2 97%   BMI 29.29 kg/m     Review of Systems  Constitutional: Negative.   HENT: Negative for congestion, dental problem, hearing loss, rhinorrhea, sinus pressure, sore throat and tinnitus.   Eyes: Negative for pain, discharge and visual disturbance.  Respiratory: Negative for cough and shortness of breath.   Cardiovascular: Negative for chest pain, palpitations and leg swelling.  Gastrointestinal: Negative for abdominal distention, abdominal pain, blood in stool, constipation, diarrhea, nausea and vomiting.  Genitourinary:  Negative for difficulty urinating, dysuria, flank pain, frequency, hematuria, pelvic pain, urgency, vaginal bleeding, vaginal discharge and vaginal pain.  Musculoskeletal: Negative for arthralgias, gait problem and joint swelling.  Skin: Negative for rash.  Neurological: Negative for dizziness, syncope, speech difficulty, weakness, numbness and headaches.  Hematological: Negative for adenopathy.  Psychiatric/Behavioral: Negative for agitation, behavioral problems and dysphoric mood. The patient is not nervous/anxious.        Objective:   Physical Exam  Constitutional: She appears well-developed and well-nourished. No distress.  Blood pressure 160/90  Cardiovascular: Normal rate and  regular rhythm.   Pulmonary/Chest: Effort normal and breath sounds normal. No respiratory distress. She has no wheezes.          Assessment & Plan:   Hypertension.  Will continue benazepril and add amlodipine 2.5 milligrams.  Low-salt diet.  Encouraged.  Patient will continue home blood pressure monitoring.  She has been asked to return in 4 weeks for follow-up.  We'll place on a DASH diet  Tevyn Codd Pilar Plate

## 2017-01-25 NOTE — Telephone Encounter (Signed)
Please advise 

## 2017-01-26 NOTE — Telephone Encounter (Signed)
Spoke with pt this morning to verify that she received my voicemail from last night.

## 2017-02-09 ENCOUNTER — Other Ambulatory Visit (INDEPENDENT_AMBULATORY_CARE_PROVIDER_SITE_OTHER): Payer: Self-pay | Admitting: Otolaryngology

## 2017-02-10 ENCOUNTER — Other Ambulatory Visit (INDEPENDENT_AMBULATORY_CARE_PROVIDER_SITE_OTHER): Payer: Self-pay | Admitting: Otolaryngology

## 2017-02-10 DIAGNOSIS — J329 Chronic sinusitis, unspecified: Secondary | ICD-10-CM

## 2017-02-16 ENCOUNTER — Ambulatory Visit
Admission: RE | Admit: 2017-02-16 | Discharge: 2017-02-16 | Disposition: A | Discharge status: Home / Self Care | Payer: Medicare Other | Source: Ambulatory Visit | Attending: Otolaryngology | Admitting: Otolaryngology

## 2017-02-16 DIAGNOSIS — J329 Chronic sinusitis, unspecified: Secondary | ICD-10-CM

## 2017-02-22 ENCOUNTER — Encounter: Payer: Self-pay | Admitting: Internal Medicine

## 2017-02-22 ENCOUNTER — Ambulatory Visit (INDEPENDENT_AMBULATORY_CARE_PROVIDER_SITE_OTHER): Payer: Medicare Other | Admitting: Internal Medicine

## 2017-02-22 VITALS — BP 148/72 | HR 75 | Temp 98.0°F | Ht 61.0 in | Wt 156.8 lb

## 2017-02-22 DIAGNOSIS — F411 Generalized anxiety disorder: Secondary | ICD-10-CM | POA: Diagnosis not present

## 2017-02-22 DIAGNOSIS — J301 Allergic rhinitis due to pollen: Secondary | ICD-10-CM

## 2017-02-22 DIAGNOSIS — I10 Essential (primary) hypertension: Secondary | ICD-10-CM | POA: Diagnosis not present

## 2017-02-22 MED ORDER — LORAZEPAM 0.5 MG PO TABS: 0.5000 mg | ORAL_TABLET | Freq: Two times a day (BID) | ORAL | 1 refills | Status: DC | PRN

## 2017-02-22 NOTE — Progress Notes (Signed)
Subjective:    Patient ID: Kristy Becker, female    DOB: 10/02/1950, 66 y.o.   MRN: [id number]  HPI  66 year old 66 patient who is seen today for follow-up of essential hypertension.  She has a history of allergic rhinitis which has been stable. More recently, amlodipine 2.5 milligrams added to her regimen. She was hospitalized earlier with syncope and volume contraction and diuretic therapy was discontinued Home blood pressure monitor reveals nice blood pressure control.  She feels quite well on this regimen.  No concerns or complaints  She does have a history of anxiety disorder and is requesting a refill on lorazepam that she takes as needed  Past Medical History:  Diagnosis Date  . ALLERGIC RHINITIS 10/06/2007  . ANXIETY 01/31/2007  . BACK PAIN 08/21/2007  . COLONIC POLYPS, HX OF 02/26/2009  . ECZEMA, HANDS 05/08/2007  . HYPERTENSION 01/31/2007     Social History   Social History  . Marital status: Married    Spouse name: N/A  . Number of children: N/A  . Years of education: N/A   Occupational History  . Not on file.   Social History Main Topics  . Smoking status: Never Smoker  . Smokeless tobacco: Never Used  . Alcohol use No  . Drug use: No  . Sexual activity: Not on file   Other Topics Concern  . Not on file   Social History Narrative  . No narrative on file    Past Surgical History:  Procedure Laterality Date  . TONSILLECTOMY AND ADENOIDECTOMY      No family history on file.  Allergies  Allergen Reactions  . Azithromycin     nausea  . Cephalosporins Other (See Comments)    unknown  . Clarithromycin Other (See Comments)    unknown  . Doxycycline Hyclate Other (See Comments)  . Penicillins Hives    Current Outpatient Prescriptions on File Prior to Visit  Medication Sig Dispense Refill  . amLODipine (NORVASC) 2.5 MG tablet Take 1 tablet (2.5 mg total) by mouth daily. 90 tablet 3  . benazepril (LOTENSIN) 20 MG tablet Take 1 tablet (20 mg total) by  mouth daily. 90 tablet 3  . Bepotastine Besilate (BEPREVE) 1.5 % SOLN Place 1 drop into both eyes as needed.    . fexofenadine (ALLEGRA) 180 MG tablet Take 180 mg by mouth daily as needed.  4  . MOMETASONE FUROATE NA Place 2 sprays into the nose daily. 50 mcg    . Multiple Vitamin (MULTIVITAMIN WITH MINERALS) TABS Take 1 tablet by mouth daily.    . Olopatadine HCl 0.6 % SOLN Place 2 puffs into the nose as needed.    . pantoprazole (PROTONIX) 40 MG tablet Take 40 mg by mouth daily.     No current facility-administered medications on file prior to visit.     BP (!) 148/72 (BP Location: Left Arm, Patient Position: Sitting, Cuff Size: Normal)   Pulse 75   Temp 98 F (36.7 C) (Oral)   Ht 5\' 1"  (1.549 m)   Wt 156 lb 12.8 oz (71.1 kg)   SpO2 98%   BMI 29.63 kg/m     Review of Systems  Constitutional: Negative.   HENT: Negative for congestion, dental problem, hearing loss, rhinorrhea, sinus pressure, sore throat and tinnitus.   Eyes: Negative for pain, discharge and visual disturbance.  Respiratory: Negative for cough and shortness of breath.   Cardiovascular: Negative for chest pain, palpitations and leg swelling.  Gastrointestinal: Negative for abdominal distention,  abdominal pain, blood in stool, constipation, diarrhea, nausea and vomiting.  Genitourinary: Negative for difficulty urinating, dysuria, flank pain, frequency, hematuria, pelvic pain, urgency, vaginal bleeding, vaginal discharge and vaginal pain.  Musculoskeletal: Negative for arthralgias, gait problem and joint swelling.  Skin: Negative for rash.  Neurological: Negative for dizziness, syncope, speech difficulty, weakness, numbness and headaches.  Hematological: Negative for adenopathy.  Psychiatric/Behavioral: Negative for agitation, behavioral problems and dysphoric mood. The patient is nervous/anxious.        Objective:   Physical Exam  Constitutional: She appears well-developed and well-nourished. No distress.    Blood pressure 136/70  Cardiovascular: Normal rate and regular rhythm.   Pulmonary/Chest: Effort normal and breath sounds normal.          Assessment & Plan:   Hypertension.  Reasonable control.  No change in medical regimen Anxiety disorder.  Lorazepam refilled Allergic rhinitis, stable  Follow-up 3 months  KWIATKOWSKI,PETER Pilar Plate

## 2017-02-22 NOTE — Patient Instructions (Signed)
Limit your sodium (Salt) intake  Please check your blood pressure on a regular basis.  If it is consistently greater than 150/90, please make an office appointment.    It is important that you exercise regularly, at least 20 minutes 3 to 4 times per week.  If you develop chest pain or shortness of breath seek  medical attention.  Return in 3 months for follow-up

## 2017-03-08 ENCOUNTER — Other Ambulatory Visit: Payer: Self-pay | Admitting: Internal Medicine

## 2017-03-31 ENCOUNTER — Ambulatory Visit (INDEPENDENT_AMBULATORY_CARE_PROVIDER_SITE_OTHER): Payer: Medicare Other | Admitting: Internal Medicine

## 2017-03-31 ENCOUNTER — Encounter: Payer: Self-pay | Admitting: Internal Medicine

## 2017-03-31 VITALS — BP 150/58 | HR 82 | Temp 98.3°F | Ht 61.0 in | Wt 157.6 lb

## 2017-03-31 DIAGNOSIS — Z23 Encounter for immunization: Secondary | ICD-10-CM

## 2017-03-31 DIAGNOSIS — I10 Essential (primary) hypertension: Secondary | ICD-10-CM

## 2017-03-31 NOTE — Patient Instructions (Signed)
Limit your sodium (Salt) intake  Return in 3 months for follow-up  Please check your blood pressure on a regular basis.  If it is consistently greater than 150/90, please make an office appointment.  Call or return to clinic prn if these symptoms worsen or fail to improve as anticipated.

## 2017-03-31 NOTE — Progress Notes (Signed)
Subjective:    Patient ID: Kristy Becker, female    DOB: 09/30/1950, 66 y.o.   MRN: [id number]  HPI  66 year old patient who has a history of essential hypertension. She has had considerable situational stress due to the poor health of her husband with Parkinson's disease She has a number of somatic concerns.  She feels she has had some edema involving her right calf and ankle area She describes some periodic blurred vision when she has episodes of stress and anxiety. She also has a small nodule that seems to be decreasing in size involving the right thumb near the DIP joint  Past Medical History:  Diagnosis Date  . ALLERGIC RHINITIS 10/06/2007  . ANXIETY 01/31/2007  . BACK PAIN 08/21/2007  . COLONIC POLYPS, HX OF 02/26/2009  . ECZEMA, HANDS 05/08/2007  . HYPERTENSION 01/31/2007     Social History   Social History  . Marital status: Married    Spouse name: N/A  . Number of children: N/A  . Years of education: N/A   Occupational History  . Not on file.   Social History Main Topics  . Smoking status: Never Smoker  . Smokeless tobacco: Never Used  . Alcohol use No  . Drug use: No  . Sexual activity: Not on file   Other Topics Concern  . Not on file   Social History Narrative  . No narrative on file    Past Surgical History:  Procedure Laterality Date  . TONSILLECTOMY AND ADENOIDECTOMY      No family history on file.  Allergies  Allergen Reactions  . Azithromycin     nausea  . Cephalosporins Other (See Comments)    unknown  . Clarithromycin Other (See Comments)    unknown  . Doxycycline Hyclate Other (See Comments)  . Penicillins Hives    Current Outpatient Prescriptions on File Prior to Visit  Medication Sig Dispense Refill  . amLODipine (NORVASC) 2.5 MG tablet Take 1 tablet (2.5 mg total) by mouth daily. 90 tablet 3  . benazepril (LOTENSIN) 20 MG tablet Take 1 tablet (20 mg total) by mouth daily. 90 tablet 3  . Bepotastine Besilate (BEPREVE) 1.5 %  SOLN Place 1 drop into both eyes as needed.    . fexofenadine (ALLEGRA) 180 MG tablet Take 180 mg by mouth daily as needed.  4  . LORazepam (ATIVAN) 0.5 MG tablet Take 1 tablet (0.5 mg total) by mouth 2 (two) times daily as needed for anxiety. 60 tablet 1  . MOMETASONE FUROATE NA Place 2 sprays into the nose daily. 50 mcg    . Multiple Vitamin (MULTIVITAMIN WITH MINERALS) TABS Take 1 tablet by mouth daily.    . Olopatadine HCl 0.6 % SOLN Place 2 puffs into the nose as needed.    . pantoprazole (PROTONIX) 40 MG tablet Take 40 mg by mouth daily.     No current facility-administered medications on file prior to visit.     BP (!) 150/58 (BP Location: Left Arm, Patient Position: Sitting, Cuff Size: Normal)   Pulse 82   Temp 98.3 F (36.8 C) (Oral)   Ht 5\' 1"  (1.549 m)   Wt 157 lb 9.6 oz (71.5 kg)   SpO2 96%   BMI 29.78 kg/m     Review of Systems  Constitutional: Negative.   HENT: Negative for congestion, dental problem, hearing loss, rhinorrhea, sinus pressure, sore throat and tinnitus.   Eyes: Negative for pain, discharge and visual disturbance.  Respiratory: Negative for cough and  shortness of breath.   Cardiovascular: Positive for leg swelling. Negative for chest pain and palpitations.  Gastrointestinal: Negative for abdominal distention, abdominal pain, blood in stool, constipation, diarrhea, nausea and vomiting.  Genitourinary: Negative for difficulty urinating, dysuria, flank pain, frequency, hematuria, pelvic pain, urgency, vaginal bleeding, vaginal discharge and vaginal pain.  Musculoskeletal: Negative for arthralgias, gait problem and joint swelling.  Skin: Negative for rash.  Neurological: Negative for dizziness, syncope, speech difficulty, weakness, numbness and headaches.  Hematological: Negative for adenopathy.  Psychiatric/Behavioral: Negative for agitation, behavioral problems and dysphoric mood. The patient is not nervous/anxious.        Objective:   Physical Exam    Constitutional: She is oriented to person, place, and time. She appears well-developed and well-nourished.  HENT:  Head: Normocephalic.  Right Ear: External ear normal.  Left Ear: External ear normal.  Mouth/Throat: Oropharynx is clear and moist.  Eyes: Pupils are equal, round, and reactive to light. Conjunctivae and EOM are normal.  Eyes appeared normal  Neck: Normal range of motion. Neck supple. No thyromegaly present.  Cardiovascular: Normal rate, regular rhythm, normal heart sounds and intact distal pulses.   Pulmonary/Chest: Effort normal and breath sounds normal.  Abdominal: Soft. Bowel sounds are normal. She exhibits no mass. There is no tenderness.  Musculoskeletal: Normal range of motion.  No clinically significant edema involving the right lower leg or ankle  Lymphadenopathy:    She has no cervical adenopathy.  Neurological: She is alert and oriented to person, place, and time.  Skin: Skin is warm and dry. No rash noted.  Small nodule involving the right distal thumb near the DIP joint consistent with a small ganglion cyst  Psychiatric: She has a normal mood and affect. Her behavior is normal.          Assessment & Plan:   Essential hypertension, stable Anxiety disorder Benign ganglion cyst Visual disturbances.  Follow-up ophthalmology Right ankle edema.  Will continue to observe low-salt diet recommended Preventive health.  Flu vaccine administered  [name]

## 2017-05-19 ENCOUNTER — Ambulatory Visit: Payer: Medicare Other | Admitting: Internal Medicine

## 2017-05-31 ENCOUNTER — Ambulatory Visit: Payer: Medicare Other | Admitting: Internal Medicine

## 2017-05-31 ENCOUNTER — Encounter: Payer: Self-pay | Admitting: Internal Medicine

## 2017-05-31 VITALS — BP 138/72 | HR 82 | Temp 98.4°F | Ht 61.0 in | Wt 158.0 lb

## 2017-05-31 DIAGNOSIS — I10 Essential (primary) hypertension: Secondary | ICD-10-CM

## 2017-05-31 NOTE — Patient Instructions (Signed)
Limit your sodium (Salt) intake  Please check your blood pressure on a regular basis.  If it is consistently greater than 150/90, please make an office appointment.  Return in 6 months for follow-up   

## 2017-05-31 NOTE — Progress Notes (Signed)
Subjective:    Patient ID: Kristy Becker, female    DOB: 10-May-1951, 66 y.o.   MRN: [id number]  HPI  66 year old patient who is seen today for follow-up of essential hypertension.  She feels well today.  No new concerns or complaints.  She has been told recently that she has cataracts.  This has caused some anxiety. She continues to have some moderate concerns with right ankle and foot edema. In general doing well  Past Medical History:  Diagnosis Date  . ALLERGIC RHINITIS 10/06/2007  . ANXIETY 01/31/2007  . BACK PAIN 08/21/2007  . COLONIC POLYPS, HX OF 02/26/2009  . ECZEMA, HANDS 05/08/2007  . HYPERTENSION 01/31/2007     Social History   Socioeconomic History  . Marital status: Married    Spouse name: Not on file  . Number of children: Not on file  . Years of education: Not on file  . Highest education level: Not on file  Social Needs  . Financial resource strain: Not on file  . Food insecurity - worry: Not on file  . Food insecurity - inability: Not on file  . Transportation needs - medical: Not on file  . Transportation needs - non-medical: Not on file  Occupational History  . Not on file  Tobacco Use  . Smoking status: Never Smoker  . Smokeless tobacco: Never Used  Substance and Sexual Activity  . Alcohol use: No  . Drug use: No  . Sexual activity: Not on file  Other Topics Concern  . Not on file  Social History Narrative  . Not on file    Past Surgical History:  Procedure Laterality Date  . TONSILLECTOMY AND ADENOIDECTOMY      History reviewed. No pertinent family history.  Allergies  Allergen Reactions  . Azithromycin     nausea  . Cephalosporins Other (See Comments)    unknown  . Clarithromycin Other (See Comments)    unknown  . Doxycycline Hyclate Other (See Comments)  . Penicillins Hives    Current Outpatient Medications on File Prior to Visit  Medication Sig Dispense Refill  . amLODipine (NORVASC) 2.5 MG tablet Take 1 tablet (2.5 mg  total) by mouth daily. 90 tablet 3  . benazepril (LOTENSIN) 20 MG tablet Take 1 tablet (20 mg total) by mouth daily. 90 tablet 3  . Bepotastine Besilate (BEPREVE) 1.5 % SOLN Place 1 drop into both eyes as needed.    . fexofenadine (ALLEGRA) 180 MG tablet Take 180 mg by mouth daily as needed.  4  . LORazepam (ATIVAN) 0.5 MG tablet Take 1 tablet (0.5 mg total) by mouth 2 (two) times daily as needed for anxiety. 60 tablet 1  . MOMETASONE FUROATE NA Place 2 sprays into the nose daily. 50 mcg    . Multiple Vitamin (MULTIVITAMIN WITH MINERALS) TABS Take 1 tablet by mouth daily.    . Olopatadine HCl 0.6 % SOLN Place 2 puffs into the nose as needed.    . pantoprazole (PROTONIX) 40 MG tablet Take 40 mg by mouth daily.     No current facility-administered medications on file prior to visit.     BP 138/72 (BP Location: Left Arm, Patient Position: Sitting, Cuff Size: Normal)   Pulse 82   Temp 98.4 F (36.9 C) (Oral)   Ht 5\' 1"  (1.549 m)   Wt 158 lb (71.7 kg)   SpO2 98%   BMI 29.85 kg/m     Review of Systems  Constitutional: Negative.   HENT: Negative  for congestion, dental problem, hearing loss, rhinorrhea, sinus pressure, sore throat and tinnitus.   Eyes: Negative for pain, discharge and visual disturbance.  Respiratory: Negative for cough and shortness of breath.   Cardiovascular: Positive for leg swelling. Negative for chest pain and palpitations.  Gastrointestinal: Negative for abdominal distention, abdominal pain, blood in stool, constipation, diarrhea, nausea and vomiting.  Genitourinary: Negative for difficulty urinating, dysuria, flank pain, frequency, hematuria, pelvic pain, urgency, vaginal bleeding, vaginal discharge and vaginal pain.  Musculoskeletal: Negative for arthralgias, gait problem and joint swelling.  Skin: Negative for rash.  Neurological: Negative for dizziness, syncope, speech difficulty, weakness, numbness and headaches.  Hematological: Negative for adenopathy.    Psychiatric/Behavioral: Negative for agitation, behavioral problems and dysphoric mood. The patient is not nervous/anxious.        Objective:   Physical Exam  Constitutional: She is oriented to person, place, and time. She appears well-developed and well-nourished. No distress.  Blood pressure 122/76  HENT:  Head: Normocephalic and atraumatic.  Right Ear: External ear normal.  Left Ear: External ear normal.  Mouth/Throat: Oropharynx is clear and moist.  Eyes: Conjunctivae and EOM are normal.  Neck: Normal range of motion. Neck supple. No JVD present. No thyromegaly present.  Cardiovascular: Normal rate, regular rhythm, normal heart sounds and intact distal pulses.  No murmur heard. Pulmonary/Chest: Effort normal and breath sounds normal. She has no wheezes. She has no rales.  Abdominal: Soft. Bowel sounds are normal. She exhibits no distension and no mass. There is no tenderness. There is no rebound and no guarding.  Genitourinary: Vagina normal.  Musculoskeletal: Normal range of motion. She exhibits no edema or tenderness.  No clinical edema  Neurological: She is alert and oriented to person, place, and time. She has normal reflexes. No cranial nerve deficit. She exhibits normal muscle tone. Coordination normal.  Skin: Skin is warm and dry. No rash noted.  Psychiatric: She has a normal mood and affect. Her behavior is normal.          Assessment & Plan:   Essential hypertension well-controlled Anxiety disorder stable  No change in medical regimen  Follow-up in 6 months or as needed  [place]

## 2017-07-06 ENCOUNTER — Encounter: Payer: Self-pay | Admitting: Family Medicine

## 2017-07-06 ENCOUNTER — Ambulatory Visit: Payer: Medicare Other | Admitting: Family Medicine

## 2017-07-06 VITALS — BP 136/60 | HR 77 | Temp 98.2°F | Wt 160.8 lb

## 2017-07-06 DIAGNOSIS — R5383 Other fatigue: Secondary | ICD-10-CM | POA: Diagnosis not present

## 2017-07-06 DIAGNOSIS — I10 Essential (primary) hypertension: Secondary | ICD-10-CM

## 2017-07-06 NOTE — Patient Instructions (Addendum)
DASH Eating Plan DASH stands for "Dietary Approaches to Stop Hypertension." The DASH eating plan is a healthy eating plan that has been shown to reduce high blood pressure (hypertension). It may also reduce your risk for type 2 diabetes, heart disease, and stroke. The DASH eating plan may also help with weight loss. What are tips for following this plan? General guidelines  Avoid eating more than 2,300 mg (milligrams) of salt (sodium) a day. If you have hypertension, you may need to reduce your sodium intake to 1,500 mg a day.  Limit alcohol intake to no more than 1 drink a day for nonpregnant women and 2 drinks a day for men. One drink equals 12 oz of beer, 5 oz of wine, or 1 oz of hard liquor.  Work with your health care provider to maintain a healthy body weight or to lose weight. Ask what an ideal weight is for you.  Get at least 30 minutes of exercise that causes your heart to beat faster (aerobic exercise) most days of the week. Activities may include walking, swimming, or biking.  Work with your health care provider or diet and nutrition specialist (dietitian) to adjust your eating plan to your individual calorie needs. Reading food labels  Check food labels for the amount of sodium per serving. Choose foods with less than 5 percent of the Daily Value of sodium. Generally, foods with less than 300 mg of sodium per serving fit into this eating plan.  To find whole grains, look for the word "whole" as the first word in the ingredient list. Shopping  Buy products labeled as "low-sodium" or "no salt added."  Buy fresh foods. Avoid canned foods and premade or frozen meals. Cooking  Avoid adding salt when cooking. Use salt-free seasonings or herbs instead of table salt or sea salt. Check with your health care provider or pharmacist before using salt substitutes.  Do not fry foods. Cook foods using healthy methods such as baking, boiling, grilling, and broiling instead.  Cook with  heart-healthy oils, such as olive, canola, soybean, or sunflower oil. Meal planning   Eat a balanced diet that includes: ? 5 or more servings of fruits and vegetables each day. At each meal, try to fill half of your plate with fruits and vegetables. ? Up to 6-8 servings of whole grains each day. ? Less than 6 oz of lean meat, poultry, or fish each day. A 3-oz serving of meat is about the same size as a deck of cards. One egg equals 1 oz. ? 2 servings of low-fat dairy each day. ? A serving of nuts, seeds, or beans 5 times each week. ? Heart-healthy fats. Healthy fats called Omega-3 fatty acids are found in foods such as flaxseeds and coldwater fish, like sardines, salmon, and mackerel.  Limit how much you eat of the following: ? Canned or prepackaged foods. ? Food that is high in trans fat, such as fried foods. ? Food that is high in saturated fat, such as fatty meat. ? Sweets, desserts, sugary drinks, and other foods with added sugar. ? Full-fat dairy products.  Do not salt foods before eating.  Try to eat at least 2 vegetarian meals each week.  Eat more home-cooked food and less restaurant, buffet, and fast food.  When eating at a restaurant, ask that your food be prepared with less salt or no salt, if possible. What foods are recommended? The items listed may not be a complete list. Talk with your dietitian about what   dietary choices are best for you. Grains Whole-grain or whole-wheat bread. Whole-grain or whole-wheat pasta. Brown rice. Oatmeal. Quinoa. Bulgur. Whole-grain and low-sodium cereals. Pita bread. Low-fat, low-sodium crackers. Whole-wheat flour tortillas. Vegetables Fresh or frozen vegetables (raw, steamed, roasted, or grilled). Low-sodium or reduced-sodium tomato and vegetable juice. Low-sodium or reduced-sodium tomato sauce and tomato paste. Low-sodium or reduced-sodium canned vegetables. Fruits All fresh, dried, or frozen fruit. Canned fruit in natural juice (without  added sugar). Meat and other protein foods Skinless chicken or turkey. Ground chicken or turkey. Pork with fat trimmed off. Fish and seafood. Egg whites. Dried beans, peas, or lentils. Unsalted nuts, nut butters, and seeds. Unsalted canned beans. Lean cuts of beef with fat trimmed off. Low-sodium, lean deli meat. Dairy Low-fat (1%) or fat-free (skim) milk. Fat-free, low-fat, or reduced-fat cheeses. Nonfat, low-sodium ricotta or cottage cheese. Low-fat or nonfat yogurt. Low-fat, low-sodium cheese. Fats and oils Soft margarine without trans fats. Vegetable oil. Low-fat, reduced-fat, or light mayonnaise and salad dressings (reduced-sodium). Canola, safflower, olive, soybean, and sunflower oils. Avocado. Seasoning and other foods Herbs. Spices. Seasoning mixes without salt. Unsalted popcorn and pretzels. Fat-free sweets. What foods are not recommended? The items listed may not be a complete list. Talk with your dietitian about what dietary choices are best for you. Grains Baked goods made with fat, such as croissants, muffins, or some breads. Dry pasta or rice meal packs. Vegetables Creamed or fried vegetables. Vegetables in a cheese sauce. Regular canned vegetables (not low-sodium or reduced-sodium). Regular canned tomato sauce and paste (not low-sodium or reduced-sodium). Regular tomato and vegetable juice (not low-sodium or reduced-sodium). Pickles. Olives. Fruits Canned fruit in a light or heavy syrup. Fried fruit. Fruit in cream or butter sauce. Meat and other protein foods Fatty cuts of meat. Ribs. Fried meat. Bacon. Sausage. Bologna and other processed lunch meats. Salami. Fatback. Hotdogs. Bratwurst. Salted nuts and seeds. Canned beans with added salt. Canned or smoked fish. Whole eggs or egg yolks. Chicken or turkey with skin. Dairy Whole or 2% milk, cream, and half-and-half. Whole or full-fat cream cheese. Whole-fat or sweetened yogurt. Full-fat cheese. Nondairy creamers. Whipped toppings.  Processed cheese and cheese spreads. Fats and oils Butter. Stick margarine. Lard. Shortening. Ghee. Bacon fat. Tropical oils, such as coconut, palm kernel, or palm oil. Seasoning and other foods Salted popcorn and pretzels. Onion salt, garlic salt, seasoned salt, table salt, and sea salt. Worcestershire sauce. Tartar sauce. Barbecue sauce. Teriyaki sauce. Soy sauce, including reduced-sodium. Steak sauce. Canned and packaged gravies. Fish sauce. Oyster sauce. Cocktail sauce. Horseradish that you find on the shelf. Ketchup. Mustard. Meat flavorings and tenderizers. Bouillon cubes. Hot sauce and Tabasco sauce. Premade or packaged marinades. Premade or packaged taco seasonings. Relishes. Regular salad dressings. Where to find more information:  National Heart, Lung, and Blood Institute: www.nhlbi.nih.gov  American Heart Association: www.heart.org Summary  The DASH eating plan is a healthy eating plan that has been shown to reduce high blood pressure (hypertension). It may also reduce your risk for type 2 diabetes, heart disease, and stroke.  With the DASH eating plan, you should limit salt (sodium) intake to 2,300 mg a day. If you have hypertension, you may need to reduce your sodium intake to 1,500 mg a day.  When on the DASH eating plan, aim to eat more fresh fruits and vegetables, whole grains, lean proteins, low-fat dairy, and heart-healthy fats.  Work with your health care provider or diet and nutrition specialist (dietitian) to adjust your eating plan to your individual   calorie needs. This information is not intended to replace advice given to you by your health care provider. Make sure you discuss any questions you have with your health care provider. Document Released: 05/20/2011 Document Revised: 05/24/2016 Document Reviewed: 05/24/2016 Elsevier Interactive Patient Education  2018 [employer].  How to Take Your Blood Pressure You can take your blood pressure at home with a machine. You  may need to check your blood pressure at home:  To check if you have high blood pressure (hypertension).  To check your blood pressure over time.  To make sure your blood pressure medicine is working.  Supplies needed: You will need a blood pressure machine, or monitor. You can buy one at a drugstore or online. When choosing one:  Choose one with an arm cuff.  Choose one that wraps around your upper arm. Only one finger should fit between your arm and the cuff.  Do not choose one that measures your blood pressure from your wrist or finger.  Your doctor can suggest a monitor. How to prepare Avoid these things for 30 minutes before checking your blood pressure:  Drinking caffeine.  Drinking alcohol.  Eating.  Smoking.  Exercising.  Five minutes before checking your blood pressure:  Pee.  Sit in a dining chair. Avoid sitting in a soft couch or armchair.  Be quiet. Do not talk.  How to take your blood pressure Follow the instructions that came with your machine. If you have a digital blood pressure monitor, these may be the instructions: 1. Sit up straight. 2. Place your feet on the floor. Do not cross your ankles or legs. 3. Rest your left arm at the level of your heart. You may rest it on a table, desk, or chair. 4. Pull up your shirt sleeve. 5. Wrap the blood pressure cuff around the upper part of your left arm. The cuff should be 1 inch (2.5 cm) above your elbow. It is best to wrap the cuff around bare skin. 6. Fit the cuff snugly around your arm. You should be able to place only one finger between the cuff and your arm. 7. Put the cord inside the groove of your elbow. 8. Press the power button. 9. Sit quietly while the cuff fills with air and loses air. 10. Write down the numbers on the screen. 11. Wait 2-3 minutes and then repeat steps 1-10.  What do the numbers mean? Two numbers make up your blood pressure. The first number is called systolic pressure. The  second is called diastolic pressure. An example of a blood pressure reading is "120 over 80" (or 120/80). If you are an adult and do not have a medical condition, use this guide to find out if your blood pressure is normal: Normal  First number: below 120.  Second number: below 80. Elevated  First number: 120-129.  Second number: below 80. Hypertension stage 1  First number: 130-139.  Second number: 80-89. Hypertension stage 2  First number: 140 or above.  Second number: 66 or above. Your blood pressure is above normal even if only the top or bottom number is above normal. Follow these instructions at home:  Check your blood pressure as often as your doctor tells you to.  Take your monitor to your next doctor's appointment. Your doctor will: ? Make sure you are using it correctly. ? Make sure it is working right.  Make sure you understand what your blood pressure numbers should be.  Tell your doctor if your  medicines are causing side effects. Contact a doctor if:  Your blood pressure keeps being high. Get help right away if:  Your first blood pressure number is higher than 180.  Your second blood pressure number is higher than 120. This information is not intended to replace advice given to you by your health care provider. Make sure you discuss any questions you have with your health care provider. Document Released: 05/13/2008 Document Revised: 04/28/2016 Document Reviewed: 11/07/2015 Elsevier Interactive Patient Education  [employer].

## 2017-07-06 NOTE — Progress Notes (Signed)
Subjective:    Patient ID: Kristy Becker, female    DOB: 04-09-1951, 67 y.o.   MRN: [id number]  Chief Complaint  Patient presents with  . Hypertension    HPI Patient was seen today for bp concerns.  Pt states her bp has been elevated and she wanted to be evaluated.  Pt has bp readings from the last 3 days with averages 117-165/52-87.  Pt has been obsessively checking her bp 4-10 x per day.  Pt endorses a h/o anxiety and being anxious about her bp.  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  Pt states a caregiver comes to the home from 8-5 during the weekday and every other Saturday.  Pt states she has thought about NH placement for her husband as it is getting hard to care for him.  Pt's son is in school and her daughter is in [place] area.  Past Medical History:  Diagnosis Date  . ALLERGIC RHINITIS 10/06/2007  . ANXIETY 01/31/2007  . BACK PAIN 08/21/2007  . COLONIC POLYPS, HX OF 02/26/2009  . ECZEMA, HANDS 05/08/2007  . HYPERTENSION 01/31/2007    Allergies  Allergen Reactions  . Azithromycin     nausea  . Cephalosporins Other (See Comments)    unknown  . Clarithromycin Other (See Comments)    unknown  . Doxycycline Hyclate Other (See Comments)  . Penicillins Hives    ROS General: Denies fever, chills, night sweats, changes in weight, changes in appetite HEENT: Denies headaches, ear pain, changes in vision, rhinorrhea, sore throat CV: Denies CP, palpitations, SOB, orthopnea Pulm: Denies SOB, cough, wheezing GI: Denies abdominal pain, nausea, vomiting, diarrhea, constipation GU: Denies dysuria, hematuria, frequency, vaginal discharge Msk: Denies muscle cramps, joint pains Neuro: Denies weakness, numbness, tingling Skin: Denies rashes, bruising Psych: Denies depression, anxiety, hallucinations     Objective:    Blood pressure 136/60, pulse 77, temperature 98.2 F (36.8 C), temperature source Oral, weight 160 lb 12.8 oz (72.9  kg).   Gen. Pleasant, well-nourished, in no distress, normal affect  HEENT: [place]/AT, face symmetric, conjunctiva clear, no scleral icterus, PERRLA, nares patent without drainage, pharynx without erythema or exudate. Lungs: no accessory muscle use, CTAB, no wheezes or rales Cardiovascular: RRR, no m/r/g, no peripheral edema Abdomen: BS present, soft, NT/ND Neuro:  A&Ox3, CN II-XII intact, normal gait Psych: mood appropriate, normal affect, tearful when discussing her husband.   Wt Readings from Last 3 Encounters:  07/06/17 160 lb 12.8 oz (72.9 kg)  05/31/17 158 lb (71.7 kg)  03/31/17 157 lb 9.6 oz (71.5 kg)    Lab Results  Component Value Date   WBC 7.9 12/07/2016   HGB 13.7 12/07/2016   HCT 40.7 12/07/2016   PLT 155 12/07/2016   GLUCOSE 107 (H) 12/17/2016   CHOL 189 10/28/2015   TRIG 64.0 10/28/2015   HDL 56.10 10/28/2015   LDLDIRECT 143.7 02/20/2010   LDLCALC 120 (H) 10/28/2015   ALT 23 04/08/2016   AST 16 04/08/2016   NA 138 12/17/2016   K 3.8 12/17/2016   CL 99 12/17/2016   CREATININE 0.86 12/17/2016   BUN 13 12/17/2016   CO2 30 12/17/2016   TSH 0.23 (L) 04/08/2016   INR 1.01 09/29/2009    Assessment/Plan:  Essential hypertension -bp controlled. -no changes made to bp regimen benazepril 20 mg daily and Norvasc 2.5 mg BID. -pt to increase po intake of water. Goal 3-4 16.9 oz bottles per day. -consider d/c'ing Norvasc 2.5 mg BID  given pt's continued pedal edema.  Caregiver fatigue -Pt given info on area resources for Parkinson's dz. -pt considering attending a support group -also mentioned individual counseling may be beneficial given pt's h/o anxiety. -pt given a list of area NH/SNFs  F/u with your PCP prn.   Grier Mitts, MD

## 2017-08-31 ENCOUNTER — Encounter: Payer: Self-pay | Admitting: Internal Medicine

## 2017-08-31 ENCOUNTER — Ambulatory Visit: Payer: Medicare Other | Admitting: Internal Medicine

## 2017-08-31 VITALS — BP 158/88 | HR 73 | Temp 98.1°F | Wt 159.0 lb

## 2017-08-31 DIAGNOSIS — I10 Essential (primary) hypertension: Secondary | ICD-10-CM

## 2017-08-31 MED ORDER — BENAZEPRIL-HYDROCHLOROTHIAZIDE 20-12.5 MG PO TABS: 1.0000 | ORAL_TABLET | Freq: Every day | ORAL | 1 refills | Status: DC

## 2017-08-31 NOTE — Progress Notes (Signed)
Subjective:    Patient ID: Kristy Becker, female    DOB: April 17, 1951, 67 y.o.   MRN: [id number]  HPI  67 year old patient who is seen today for follow-up of hypertension.  She is complaining of pedal edema and some discomfort involving the dorsal aspect of her right foot.  She is concerned about amlodipine aggravating fluid retention. She has been under situational stress with her husband presently an inpatient  Past Medical History:  Diagnosis Date  . ALLERGIC RHINITIS 10/06/2007  . ANXIETY 01/31/2007  . BACK PAIN 08/21/2007  . COLONIC POLYPS, HX OF 02/26/2009  . ECZEMA, HANDS 05/08/2007  . HYPERTENSION 01/31/2007     Social History   Socioeconomic History  . Marital status: Married    Spouse name: Not on file  . Number of children: Not on file  . Years of education: Not on file  . Highest education level: Not on file  Social Needs  . Financial resource strain: Not on file  . Food insecurity - worry: Not on file  . Food insecurity - inability: Not on file  . Transportation needs - medical: Not on file  . Transportation needs - non-medical: Not on file  Occupational History  . Not on file  Tobacco Use  . Smoking status: Never Smoker  . Smokeless tobacco: Never Used  Substance and Sexual Activity  . Alcohol use: No  . Drug use: No  . Sexual activity: Not on file  Other Topics Concern  . Not on file  Social History Narrative  . Not on file    Past Surgical History:  Procedure Laterality Date  . TONSILLECTOMY AND ADENOIDECTOMY      History reviewed. No pertinent family history.  Allergies  Allergen Reactions  . Azithromycin     nausea  . Cephalosporins Other (See Comments)    unknown  . Clarithromycin Other (See Comments)    unknown  . Doxycycline Hyclate Other (See Comments)  . Penicillins Hives    Current Outpatient Medications on File Prior to Visit  Medication Sig Dispense Refill  . amLODipine (NORVASC) 2.5 MG tablet Take 1 tablet (2.5 mg total) by  mouth daily. 90 tablet 3  . benazepril (LOTENSIN) 20 MG tablet Take 1 tablet (20 mg total) by mouth daily. 90 tablet 3  . Bepotastine Besilate (BEPREVE) 1.5 % SOLN Place 1 drop into both eyes as needed.    . fexofenadine (ALLEGRA) 180 MG tablet Take 180 mg by mouth daily as needed.  4  . LORazepam (ATIVAN) 0.5 MG tablet Take 1 tablet (0.5 mg total) by mouth 2 (two) times daily as needed for anxiety. 60 tablet 1  . MOMETASONE FUROATE NA Place 2 sprays into the nose daily. 50 mcg    . Multiple Vitamin (MULTIVITAMIN WITH MINERALS) TABS Take 1 tablet by mouth daily.    . Olopatadine HCl 0.6 % SOLN Place 2 puffs into the nose as needed.    . pantoprazole (PROTONIX) 40 MG tablet Take 40 mg by mouth daily.     No current facility-administered medications on file prior to visit.     BP (!) 158/88 (BP Location: Right Arm, Patient Position: Sitting, Cuff Size: Large)   Pulse 73   Temp 98.1 F (36.7 C) (Oral)   Wt 159 lb (72.1 kg)   SpO2 98%   BMI 30.04 kg/m     Review of Systems  Constitutional: Negative.   HENT: Negative for congestion, dental problem, hearing loss, rhinorrhea, sinus pressure, sore throat and  tinnitus.   Eyes: Negative for pain, discharge and visual disturbance.  Respiratory: Negative for cough and shortness of breath.   Cardiovascular: Positive for leg swelling. Negative for chest pain and palpitations.  Gastrointestinal: Negative for abdominal distention, abdominal pain, blood in stool, constipation, diarrhea, nausea and vomiting.  Genitourinary: Negative for difficulty urinating, dysuria, flank pain, frequency, hematuria, pelvic pain, urgency, vaginal bleeding, vaginal discharge and vaginal pain.  Musculoskeletal: Negative for arthralgias, gait problem and joint swelling.  Skin: Negative for rash.  Neurological: Negative for dizziness, syncope, speech difficulty, weakness, numbness and headaches.  Hematological: Negative for adenopathy.  Psychiatric/Behavioral: Negative  for agitation, behavioral problems and dysphoric mood. The patient is nervous/anxious.        Objective:   Physical Exam  Constitutional: She is oriented to person, place, and time. She appears well-developed and well-nourished.  Blood pressure 150/76  HENT:  Head: Normocephalic.  Right Ear: External ear normal.  Left Ear: External ear normal.  Mouth/Throat: Oropharynx is clear and moist.  Eyes: Conjunctivae and EOM are normal. Pupils are equal, round, and reactive to light.  Neck: Normal range of motion. Neck supple. No thyromegaly present.  Cardiovascular: Normal rate, regular rhythm, normal heart sounds and intact distal pulses.  Pulmonary/Chest: Effort normal and breath sounds normal.  Abdominal: Soft. Bowel sounds are normal. She exhibits no mass. There is no tenderness.  Musculoskeletal: Normal range of motion.  No obvious clinical edema of the feet There is some indentation of the skin involving the dorsal aspect of the right foot  Lymphadenopathy:    She has no cervical adenopathy.  Neurological: She is alert and oriented to person, place, and time.  Skin: Skin is warm and dry. No rash noted.  Psychiatric: She has a normal mood and affect. Her behavior is normal.          Assessment & Plan:   Essential hypertension History of pedal edema  Pedal edema is very mild if present at all.  It appears that the biggest factor may be tight fitting shoes.  Options discussed.  Have elected to increase hydrochlorothiazide to 25 mg and observe Follow-up 4 weeks Low-salt diet recommended  [name]

## 2017-08-31 NOTE — Patient Instructions (Signed)
Limit your sodium (Salt) intake  Please check your blood pressure on a regular basis.  If it is consistently greater than 150/90, please make an office appointment.  Return in one month for follow-up  

## 2017-09-05 ENCOUNTER — Telehealth: Payer: Self-pay | Admitting: Internal Medicine

## 2017-09-05 NOTE — Telephone Encounter (Signed)
Patient has ASA listed on outside medications- not on current list-  Patient wants to know if she is to continue aspirin with the Lotensin HCT. Attempted to call patient to verify aspirin daily - left message.

## 2017-09-05 NOTE — Telephone Encounter (Signed)
Copied from [place] [id number]. Topic: Quick Communication - See Telephone Encounter >> Sep 05, 2017  8:37 AM Bea Graff, NT wrote: CRM for notification. See Telephone encounter for: 09/05/17. Pt began benazepril-hydrochlorthiazide last week and she is wanting to know if Dr. Burnice Logan wanted her to continue taking her aspirin with this medication as well.

## 2017-09-05 NOTE — Telephone Encounter (Signed)
Informed patient to discontinue the aspirin. Patient verbalized understanding.

## 2017-09-05 NOTE — Telephone Encounter (Signed)
Notify patient that okay to discontinue aspirin

## 2017-09-05 NOTE — Telephone Encounter (Signed)
Pt calling back about if she should be taking an ASA daily

## 2017-09-29 ENCOUNTER — Encounter: Payer: Self-pay | Admitting: Internal Medicine

## 2017-09-29 ENCOUNTER — Ambulatory Visit: Payer: Medicare Other | Admitting: Internal Medicine

## 2017-09-29 VITALS — BP 130/62 | HR 73 | Temp 98.0°F | Wt 156.0 lb

## 2017-09-29 DIAGNOSIS — I10 Essential (primary) hypertension: Secondary | ICD-10-CM

## 2017-09-29 NOTE — Patient Instructions (Signed)
Limit your sodium (Salt) intake  Please check your blood pressure on a regular basis.  If it is consistently greater than 150/90, please make an office appointment.  Return in 4 months for follow-up  

## 2017-09-29 NOTE — Progress Notes (Signed)
Subjective:    Patient ID: Kristy Becker, female    DOB: Apr 13, 1951, 67 y.o.   MRN: [id number]  HPI  67 year old patient who is seen today for follow-up of hypertension.  She was seen last month and a bit concerned about some swelling involving her right foot.  Clinical exam was fairly unremarkable She generally feels well. She has multiple concerns and questions about her husband who is followed by neurology with Parkinson's disease  Past Medical History:  Diagnosis Date  . ALLERGIC RHINITIS 10/06/2007  . ANXIETY 01/31/2007  . BACK PAIN 08/21/2007  . COLONIC POLYPS, HX OF 02/26/2009  . ECZEMA, HANDS 05/08/2007  . HYPERTENSION 01/31/2007     Social History   Socioeconomic History  . Marital status: Married    Spouse name: Not on file  . Number of children: Not on file  . Years of education: Not on file  . Highest education level: Not on file  Occupational History  . Not on file  Social Needs  . Financial resource strain: Not on file  . Food insecurity:    Worry: Not on file    Inability: Not on file  . Transportation needs:    Medical: Not on file    Non-medical: Not on file  Tobacco Use  . Smoking status: Never Smoker  . Smokeless tobacco: Never Used  Substance and Sexual Activity  . Alcohol use: No  . Drug use: No  . Sexual activity: Not on file  Lifestyle  . Physical activity:    Days per week: Not on file    Minutes per session: Not on file  . Stress: Not on file  Relationships  . Social connections:    Talks on phone: Not on file    Gets together: Not on file    Attends religious service: Not on file    Active member of club or organization: Not on file    Attends meetings of clubs or organizations: Not on file    Relationship status: Not on file  . Intimate partner violence:    Fear of current or ex partner: Not on file    Emotionally abused: Not on file    Physically abused: Not on file    Forced sexual activity: Not on file  Other Topics Concern    . Not on file  Social History Narrative  . Not on file    Past Surgical History:  Procedure Laterality Date  . TONSILLECTOMY AND ADENOIDECTOMY      History reviewed. No pertinent family history.  Allergies  Allergen Reactions  . Azithromycin     nausea  . Cephalosporins Other (See Comments)    unknown  . Clarithromycin Other (See Comments)    unknown  . Doxycycline Hyclate Other (See Comments)  . Penicillins Hives    Current Outpatient Medications on File Prior to Visit  Medication Sig Dispense Refill  . amLODipine (NORVASC) 2.5 MG tablet Take 1 tablet (2.5 mg total) by mouth daily. 90 tablet 3  . benazepril-hydrochlorthiazide (LOTENSIN HCT) 20-12.5 MG tablet Take 1 tablet by mouth daily. 90 tablet 1  . Bepotastine Besilate (BEPREVE) 1.5 % SOLN Place 1 drop into both eyes as needed.    . fexofenadine (ALLEGRA) 180 MG tablet Take 180 mg by mouth daily as needed.  4  . LORazepam (ATIVAN) 0.5 MG tablet Take 1 tablet (0.5 mg total) by mouth 2 (two) times daily as needed for anxiety. 60 tablet 1  . MOMETASONE FUROATE NA Place  2 sprays into the nose daily. 50 mcg    . Multiple Vitamin (MULTIVITAMIN WITH MINERALS) TABS Take 1 tablet by mouth daily.    . Olopatadine HCl 0.6 % SOLN Place 2 puffs into the nose as needed.    . pantoprazole (PROTONIX) 40 MG tablet Take 40 mg by mouth daily.     No current facility-administered medications on file prior to visit.     BP 130/62 (BP Location: Right Arm, Patient Position: Sitting, Cuff Size: Normal)   Pulse 73   Temp 98 F (36.7 C) (Oral)   Wt 156 lb (70.8 kg)   SpO2 98%   BMI 29.48 kg/m     Review of Systems  Constitutional: Negative.   HENT: Negative for congestion, dental problem, hearing loss, rhinorrhea, sinus pressure, sore throat and tinnitus.   Eyes: Negative for pain, discharge and visual disturbance.  Respiratory: Negative for cough and shortness of breath.   Cardiovascular: Positive for leg swelling. Negative for  chest pain and palpitations.  Gastrointestinal: Negative for abdominal distention, abdominal pain, blood in stool, constipation, diarrhea, nausea and vomiting.  Genitourinary: Negative for difficulty urinating, dysuria, flank pain, frequency, hematuria, pelvic pain, urgency, vaginal bleeding, vaginal discharge and vaginal pain.  Musculoskeletal: Negative for arthralgias, gait problem and joint swelling.  Skin: Negative for rash.  Neurological: Negative for dizziness, syncope, speech difficulty, weakness, numbness and headaches.  Hematological: Negative for adenopathy.  Psychiatric/Behavioral: Negative for agitation, behavioral problems and dysphoric mood. The patient is nervous/anxious.        Objective:   Physical Exam  Constitutional: She is oriented to person, place, and time. She appears well-developed and well-nourished.  Blood pressure 124/70  HENT:  Head: Normocephalic.  Right Ear: External ear normal.  Left Ear: External ear normal.  Mouth/Throat: Oropharynx is clear and moist.  Eyes: Pupils are equal, round, and reactive to light. Conjunctivae and EOM are normal.  Neck: Normal range of motion. Neck supple. No thyromegaly present.  Cardiovascular: Normal rate, regular rhythm, normal heart sounds and intact distal pulses.  Pulmonary/Chest: Effort normal and breath sounds normal.  Abdominal: Soft. Bowel sounds are normal. She exhibits no mass. There is no tenderness.  Musculoskeletal: Normal range of motion. She exhibits edema.  Patient does have some soft tissue swelling involving the dorsal aspect of the right foot.  This seems to be  concentrated in a 3-4 mm area of fullness in the proximal dorsal aspect of the foot near the ankle.  There is no erythema or tenderness to palpation  Lymphadenopathy:    She has no cervical adenopathy.  Neurological: She is alert and oriented to person, place, and time.  Skin: Skin is warm and dry. No rash noted.  Psychiatric: She has a normal  mood and affect. Her behavior is normal.          Assessment & Plan:   Essential hypertension Mild pedal edema.  Patient is on low-dose amlodipine will continue to observe at this point stress low-salt diet.  She will call if clinical worsening otherwise return in 4 months for follow-up  [name]

## 2017-11-09 ENCOUNTER — Ambulatory Visit: Payer: Medicare Other | Admitting: Internal Medicine

## 2017-11-09 ENCOUNTER — Encounter: Payer: Self-pay | Admitting: Internal Medicine

## 2017-11-09 VITALS — BP 118/58 | HR 63 | Temp 98.5°F | Wt 158.0 lb

## 2017-11-09 DIAGNOSIS — I10 Essential (primary) hypertension: Secondary | ICD-10-CM

## 2017-11-09 MED ORDER — TRIAMCINOLONE ACETONIDE 0.1 % EX CREA: 1.0000 "application " | TOPICAL_CREAM | Freq: Two times a day (BID) | CUTANEOUS | 0 refills | Status: DC

## 2017-11-09 NOTE — Patient Instructions (Signed)
Insect Bite, Adult An insect bite can make your skin red, itchy, and swollen. Some insects can spread disease to people with a bite. However, most insect bites do not lead to disease, and most are not serious. Follow these instructions at home: Bite area care  Do not scratch the bite area.  Keep the bite area clean and dry.  Wash the bite area every day with soap and water as told by your doctor.  Check the bite area every day for signs of infection. Check for: ? More redness, swelling, or pain. ? Fluid or blood. ? Warmth. ? Pus. Managing pain, itching, and swelling  You may put any of these on the bite area as told by your doctor: ? A baking soda paste. ? Cortisone cream. ? Calamine lotion.  If directed, put ice on the bite area. ? Put ice in a plastic bag. ? Place a towel between your skin and the bag. ? Leave the ice on for 20 minutes, 2-3 times a day. Medicines  Take medicines or put medicines on your skin only as told by your doctor.  If you were prescribed an antibiotic medicine, use it as told by your doctor. Do not stop using the antibiotic even if your condition improves. General instructions  Keep all follow-up visits as told by your doctor. This is important. How is this prevented? To help you have a lower risk of insect bites:  When you are outside, wear clothing that covers your arms and legs.  Use insect repellent. The best insect repellents have: ? An active ingredient of DEET, picaridin, oil of lemon eucalyptus (OLE), or IR3535. ? Higher amounts of DEET or another active ingredient than other repellents have.  If your home windows do not have screens, think about putting some in.  Contact a doctor if:  You have more redness, swelling, or pain in the bite area.  You have fluid, blood, or pus coming from the bite area.  The bite area feels warm.  You have a fever. Get help right away if:  You have joint pain.  You have a rash.  You have  shortness of breath.  You feel more tired or sleepy than you normally do.  You have neck pain.  You have a headache.  You feel weaker than you normally do.  You have chest pain.  You have pain in your belly.  You feel sick to your stomach (nauseous) or you throw up (vomit). Summary  An insect bite can make your skin red, itchy, and swollen.  Do not scratch the bite area, and keep it clean and dry.  Ice can help with pain and itching from the bite. This information is not intended to replace advice given to you by your health care provider. Make sure you discuss any questions you have with your health care provider. Document Released: 05/28/2000 Document Revised: 01/01/2016 Document Reviewed: 10/16/2014 Elsevier Interactive Patient Education  2018 [employer]  

## 2017-11-09 NOTE — Progress Notes (Signed)
Subjective:    Patient ID: Kristy Becker, female    DOB: 05-02-1951, 67 y.o.   MRN: [id number]  HPI  67 year old patient who has a history of hypertension.  She presents with a chief complaint of a suspected insect bite involving her left anterior mid lower leg area.  She describes some pruritus but otherwise no symptoms.  She had been doing some outdoor yard work and shorts.  She has been using topical Benadryl cream  Past Medical History:  Diagnosis Date  . ALLERGIC RHINITIS 10/06/2007  . ANXIETY 01/31/2007  . BACK PAIN 08/21/2007  . COLONIC POLYPS, HX OF 02/26/2009  . ECZEMA, HANDS 05/08/2007  . HYPERTENSION 01/31/2007     Social History   Socioeconomic History  . Marital status: Married    Spouse name: Not on file  . Number of children: Not on file  . Years of education: Not on file  . Highest education level: Not on file  Occupational History  . Not on file  Social Needs  . Financial resource strain: Not on file  . Food insecurity:    Worry: Not on file    Inability: Not on file  . Transportation needs:    Medical: Not on file    Non-medical: Not on file  Tobacco Use  . Smoking status: Never Smoker  . Smokeless tobacco: Never Used  Substance and Sexual Activity  . Alcohol use: No  . Drug use: No  . Sexual activity: Not on file  Lifestyle  . Physical activity:    Days per week: Not on file    Minutes per session: Not on file  . Stress: Not on file  Relationships  . Social connections:    Talks on phone: Not on file    Gets together: Not on file    Attends religious service: Not on file    Active member of club or organization: Not on file    Attends meetings of clubs or organizations: Not on file    Relationship status: Not on file  . Intimate partner violence:    Fear of current or ex partner: Not on file    Emotionally abused: Not on file    Physically abused: Not on file    Forced sexual activity: Not on file  Other Topics Concern  . Not on file    Social History Narrative  . Not on file    Past Surgical History:  Procedure Laterality Date  . TONSILLECTOMY AND ADENOIDECTOMY      History reviewed. No pertinent family history.  Allergies  Allergen Reactions  . Azithromycin     nausea  . Cephalosporins Other (See Comments)    unknown  . Clarithromycin Other (See Comments)    unknown  . Doxycycline Hyclate Other (See Comments)  . Penicillins Hives    Current Outpatient Medications on File Prior to Visit  Medication Sig Dispense Refill  . amLODipine (NORVASC) 2.5 MG tablet Take 1 tablet (2.5 mg total) by mouth daily. 90 tablet 3  . benazepril-hydrochlorthiazide (LOTENSIN HCT) 20-12.5 MG tablet Take 1 tablet by mouth daily. 90 tablet 1  . Bepotastine Besilate (BEPREVE) 1.5 % SOLN Place 1 drop into both eyes as needed.    . fexofenadine (ALLEGRA) 180 MG tablet Take 180 mg by mouth daily as needed.  4  . LORazepam (ATIVAN) 0.5 MG tablet Take 1 tablet (0.5 mg total) by mouth 2 (two) times daily as needed for anxiety. 60 tablet 1  . MOMETASONE FUROATE  NA Place 2 sprays into the nose daily. 50 mcg    . Multiple Vitamin (MULTIVITAMIN WITH MINERALS) TABS Take 1 tablet by mouth daily.    . Olopatadine HCl 0.6 % SOLN Place 2 puffs into the nose as needed.    . pantoprazole (PROTONIX) 40 MG tablet Take 40 mg by mouth daily.     No current facility-administered medications on file prior to visit.     BP (!) 118/58 (BP Location: Right Arm, Patient Position: Sitting, Cuff Size: Large)   Pulse 63   Temp 98.5 F (36.9 C) (Oral)   Wt 158 lb (71.7 kg)   SpO2 98%   BMI 29.85 kg/m     Review of Systems  Constitutional: Negative.   Skin: Positive for rash.       Objective:   Physical Exam  Constitutional: She appears well-developed and well-nourished. No distress.  Blood pressure 122/64  Skin:  Irregular area of patchy erythema about 2 x 4 cm There is a small central crusted ulcer.  No fluctuance or tenderness            Assessment & Plan:  Probable insect bite with some associated pruritic and inflammatory reaction.  Local wound care discussed Will treat with short-term triamcinolone cream  Essential hypertension stable.  Follow-up August as scheduled  [name]

## 2017-11-17 ENCOUNTER — Encounter: Payer: Self-pay | Admitting: Family Medicine

## 2017-11-17 ENCOUNTER — Ambulatory Visit: Payer: Medicare Other | Admitting: Family Medicine

## 2017-11-17 VITALS — BP 118/60 | HR 88 | Temp 98.1°F | Wt 156.0 lb

## 2017-11-17 DIAGNOSIS — M1990 Unspecified osteoarthritis, unspecified site: Secondary | ICD-10-CM | POA: Diagnosis not present

## 2017-11-17 NOTE — Progress Notes (Signed)
Subjective:    Patient ID: Kristy Becker, female    DOB: [birth year], 67 y.o.   MRN: [id number]  No chief complaint on file.   HPI Patient was seen today for acute concern.  Pt thinks her legs look swollen, noticed this today.  Denies pain in bilateral legs, erythema, insect bites, wearing tight clothes.  Endorses  prolonged standing on her feet.  Pt recently seen by [facility] for R foot pain.  Told 2/2 arthritis in dorsum of R foot.  Given Voltaren gel.  Pt has been afraid to use the Voltaren gel after reading info on it and the "hesitancy of the insurance company" to pay for it.  Pt also notes increased stress as her husband has Parkinson's and is not doing well.  Past Medical History:  Diagnosis Date  . ALLERGIC RHINITIS 10/06/2007  . ANXIETY 01/31/2007  . BACK PAIN 08/21/2007  . COLONIC POLYPS, HX OF 02/26/2009  . ECZEMA, HANDS 05/08/2007  . HYPERTENSION 01/31/2007    Allergies  Allergen Reactions  . Azithromycin     nausea  . Cephalosporins Other (See Comments)    unknown  . Clarithromycin Other (See Comments)    unknown  . Doxycycline Hyclate Other (See Comments)  . Penicillins Hives    ROS General: Denies fever, chills, night sweats, changes in weight, changes in appetite HEENT: Denies headaches, ear pain, changes in vision, rhinorrhea, sore throat CV: Denies CP, palpitations, SOB, orthopnea Pulm: Denies SOB, cough, wheezing GI: Denies abdominal pain, nausea, vomiting, diarrhea, constipation GU: Denies dysuria, hematuria, frequency, vaginal discharge Msk: Denies muscle cramps, joint pains  +leg swelling, R foot pain Neuro: Denies weakness, numbness, tingling Skin: Denies rashes, bruising Psych: Denies depression, anxiety, hallucinations     Objective:    Blood pressure 118/60, pulse 88, temperature 98.1 F (36.7 C), temperature source Oral, weight 156 lb (70.8 kg), SpO2 97 %.   Gen. Pleasant, well-nourished, in no distress, normal affect   HEENT:  [address]/AT, face symmetric, no scleral icterus, PERRLA, nares patent without drainage,  Musculoskeletal: No deformities, no cyanosis or clubbing, normal tone.  No TTP of joint spaces of b/l knees. No TTP of dorsum of R foot. Neuro:  A&Ox3, CN II-XII intact, normal gait with adipose tissue of medial thighs rubbing against each other when walking. Skin:  Warm, dry, intact. no lesions/ rash, edema, erythema, or induration.   Wt Readings from Last 3 Encounters:  11/17/17 156 lb (70.8 kg)  11/09/17 158 lb (71.7 kg)  09/29/17 156 lb (70.8 kg)    Lab Results  Component Value Date   WBC 7.9 12/07/2016   HGB 13.7 12/07/2016   HCT 40.7 12/07/2016   PLT 155 12/07/2016   GLUCOSE 107 (H) 12/17/2016   CHOL 189 10/28/2015   TRIG 64.0 10/28/2015   HDL 56.10 10/28/2015   LDLDIRECT 143.7 02/20/2010   LDLCALC 120 (H) 10/28/2015   ALT 23 04/08/2016   AST 16 04/08/2016   NA 138 12/17/2016   K 3.8 12/17/2016   CL 99 12/17/2016   CREATININE 0.86 12/17/2016   BUN 13 12/17/2016   CO2 30 12/17/2016   TSH 0.23 (L) 04/08/2016   INR 1.01 09/29/2009    Assessment/Plan:  Arthritis  -pt reassured. -discussed ok to use Voltaren gel if needed for dorsum of R foot.  Otherwise pt reassured as no signs of LE edema noted on exam.  Likely having irritation of b/l thighs rubbing together with ambulation.  Can use ice if needed for any  discomfort.  F/u prn  Grier Mitts, MD

## 2017-11-29 ENCOUNTER — Ambulatory Visit: Payer: Medicare Other | Admitting: Internal Medicine

## 2017-12-01 ENCOUNTER — Ambulatory Visit: Payer: Medicare Other | Admitting: Internal Medicine

## 2017-12-05 ENCOUNTER — Ambulatory Visit: Payer: Medicare Other | Admitting: Internal Medicine

## 2017-12-05 ENCOUNTER — Encounter: Payer: Self-pay | Admitting: Internal Medicine

## 2017-12-05 VITALS — BP 122/60 | Temp 98.4°F | Wt 159.0 lb

## 2017-12-05 DIAGNOSIS — I10 Essential (primary) hypertension: Secondary | ICD-10-CM

## 2017-12-05 DIAGNOSIS — M79671 Pain in right foot: Secondary | ICD-10-CM | POA: Diagnosis not present

## 2017-12-05 NOTE — Patient Instructions (Signed)
Call or return to clinic prn if these symptoms worsen or fail to improve as anticipated.  Limit your sodium (Salt) intake  Please check your blood pressure on a regular basis.  If it is consistently greater than 150/90, please make an office appointment.

## 2017-12-05 NOTE — Progress Notes (Signed)
Subjective:    Patient ID: Kristy Becker, female    DOB: 15-Apr-1951, 67 y.o.   MRN: [id number]  HPI  67 year old patient who has essential hypertension and anxiety disorder.  She has been followed recently due to pain involving the dorsal aspect of her right foot.  She has been seen by orthopedics and radiographs obtained.  She was told she had some arthritis involving the foot.  She was prescribed topical diclofenac which she has been reluctant to take.  Past Medical History:  Diagnosis Date  . ALLERGIC RHINITIS 10/06/2007  . ANXIETY 01/31/2007  . BACK PAIN 08/21/2007  . COLONIC POLYPS, HX OF 02/26/2009  . ECZEMA, HANDS 05/08/2007  . HYPERTENSION 01/31/2007     Social History   Socioeconomic History  . Marital status: Married    Spouse name: Not on file  . Number of children: Not on file  . Years of education: Not on file  . Highest education level: Not on file  Occupational History  . Not on file  Social Needs  . Financial resource strain: Not on file  . Food insecurity:    Worry: Not on file    Inability: Not on file  . Transportation needs:    Medical: Not on file    Non-medical: Not on file  Tobacco Use  . Smoking status: Never Smoker  . Smokeless tobacco: Never Used  Substance and Sexual Activity  . Alcohol use: No  . Drug use: No  . Sexual activity: Not on file  Lifestyle  . Physical activity:    Days per week: Not on file    Minutes per session: Not on file  . Stress: Not on file  Relationships  . Social connections:    Talks on phone: Not on file    Gets together: Not on file    Attends religious service: Not on file    Active member of club or organization: Not on file    Attends meetings of clubs or organizations: Not on file    Relationship status: Not on file  . Intimate partner violence:    Fear of current or ex partner: Not on file    Emotionally abused: Not on file    Physically abused: Not on file    Forced sexual activity: Not on file    Other Topics Concern  . Not on file  Social History Narrative  . Not on file    Past Surgical History:  Procedure Laterality Date  . TONSILLECTOMY AND ADENOIDECTOMY      History reviewed. No pertinent family history.  Allergies  Allergen Reactions  . Azithromycin     nausea  . Cephalosporins Other (See Comments)    unknown  . Clarithromycin Other (See Comments)    unknown  . Doxycycline Hyclate Other (See Comments)  . Penicillins Hives    Current Outpatient Medications on File Prior to Visit  Medication Sig Dispense Refill  . amLODipine (NORVASC) 2.5 MG tablet Take 1 tablet (2.5 mg total) by mouth daily. 90 tablet 3  . benazepril-hydrochlorthiazide (LOTENSIN HCT) 20-12.5 MG tablet Take 1 tablet by mouth daily. 90 tablet 1  . Bepotastine Besilate (BEPREVE) 1.5 % SOLN Place 1 drop into both eyes as needed.    . famotidine (PEPCID) 20 MG tablet Take 20 mg by mouth 2 (two) times daily.    . fexofenadine (ALLEGRA) 180 MG tablet Take 180 mg by mouth daily as needed.  4  . LORazepam (ATIVAN) 0.5 MG tablet Take  1 tablet (0.5 mg total) by mouth 2 (two) times daily as needed for anxiety. 60 tablet 1  . MOMETASONE FUROATE NA Place 2 sprays into the nose daily. 50 mcg    . Multiple Vitamin (MULTIVITAMIN WITH MINERALS) TABS Take 1 tablet by mouth daily.    . Olopatadine HCl 0.6 % SOLN Place 2 puffs into the nose as needed.    . pantoprazole (PROTONIX) 40 MG tablet Take 40 mg by mouth daily.    Marland Kitchen triamcinolone cream (KENALOG) 0.1 % Apply 1 application topically 2 (two) times daily. 30 g 0   No current facility-administered medications on file prior to visit.     BP 122/60 (BP Location: Right Arm, Patient Position: Sitting, Cuff Size: Large)   Temp 98.4 F (36.9 C) (Oral)   Wt 159 lb (72.1 kg)   BMI 30.04 kg/m      Review of Systems  Constitutional: Negative.   HENT: Negative for congestion, dental problem, hearing loss, rhinorrhea, sinus pressure, sore throat and tinnitus.    Eyes: Negative for pain, discharge and visual disturbance.  Respiratory: Negative for cough and shortness of breath.   Cardiovascular: Negative for chest pain, palpitations and leg swelling.  Gastrointestinal: Negative for abdominal distention, abdominal pain, blood in stool, constipation, diarrhea, nausea and vomiting.  Genitourinary: Negative for difficulty urinating, dysuria, flank pain, frequency, hematuria, pelvic pain, urgency, vaginal bleeding, vaginal discharge and vaginal pain.  Musculoskeletal: Negative for arthralgias, gait problem and joint swelling.       Right dorsal foot pain  Skin: Negative for rash.  Neurological: Negative for dizziness, syncope, speech difficulty, weakness, numbness and headaches.  Hematological: Negative for adenopathy.  Psychiatric/Behavioral: Negative for agitation, behavioral problems and dysphoric mood. The patient is nervous/anxious.        Objective:   Physical Exam  Constitutional: She appears well-developed and well-nourished. No distress.  Blood pressure well controlled  Musculoskeletal:  Mild soft tissue swelling involving the dorsal aspect of the right foot no ankle swelling          Assessment & Plan:  Right foot pain/arthritis.  The patient has been reluctant to try the diclofenac cream she will consider.  Today she states the pain is much improved Essential hypertension well controlled Anxiety disorder  Follow-up as scheduled  Marletta Lor

## 2017-12-13 ENCOUNTER — Encounter: Payer: Self-pay | Admitting: Internal Medicine

## 2017-12-13 ENCOUNTER — Ambulatory Visit: Payer: Medicare Other | Admitting: Internal Medicine

## 2017-12-13 ENCOUNTER — Ambulatory Visit: Payer: Self-pay | Admitting: *Deleted

## 2017-12-13 VITALS — BP 130/68 | HR 61 | Temp 99.1°F | Wt 159.0 lb

## 2017-12-13 DIAGNOSIS — Z87898 Personal history of other specified conditions: Secondary | ICD-10-CM

## 2017-12-13 DIAGNOSIS — I10 Essential (primary) hypertension: Secondary | ICD-10-CM | POA: Diagnosis not present

## 2017-12-13 DIAGNOSIS — F411 Generalized anxiety disorder: Secondary | ICD-10-CM

## 2017-12-13 NOTE — Telephone Encounter (Signed)
Noted  

## 2017-12-13 NOTE — Telephone Encounter (Signed)
Left message for patient to phone back if she needs a nurse.

## 2017-12-13 NOTE — Telephone Encounter (Signed)
Patient experienced a flutter sensation with the heart this morning which lasted on seconds and resolved itself. She has had nausea intermittently over the last several days but no vomiting. Denies any other symptoms at this time. She has been diagnosed with palpitations in the past. Stated her husband has parkinson's and she has been running errands like crazy and stated this could be related to stress, however, she stated "something doesn't feel normal". Appointment made with PCP today at 2:30p. Reason for Disposition . Age > 60 years (Exception: brief heart beat symptoms that went away and now feels well)  Answer Assessment - Initial Assessment Questions 1. NAUSEA SEVERITY: "How bad is the nausea?" (e.g., mild, moderate, severe; dehydration, weight loss)   - MILD: loss of appetite without change in eating habits   - MODERATE: decreased oral intake without significant weight loss, dehydration, or malnutrition   - SEVERE: inadequate caloric or fluid intake, significant weight loss, symptoms of dehydration     Mild,still eating and drinking. 2. ONSET: "When did the nausea begin?"     Today is day 3 of nausea, no vomiting. 3. VOMITING: "Any vomiting?" If so, ask: "How many times today?"     none 4. RECURRENT SYMPTOM: "Have you had nausea before?" If so, ask: "When was the last time?" "What happened that time?"     Occasional nausea in the past. 5. CAUSE: "What do you think is causing the nausea?"     Maybe heart palpitations 6. PREGNANCY: "Is there any chance you are pregnant?" (e.g., unprotected intercourse, missed birth control pill, broken condom)     no  Answer Assessment - Initial Assessment Questions 1. DESCRIPTION: "Please describe your heart rate or heart beat that you are having" (e.g., fast/slow, regular/irregular, skipped or extra beats, "palpitations")     flutter 2. ONSET: "When did it start?" (Minutes, hours or days)      She has had palpitations in the past. Two weeks ago she  began feeling the heart flutter-like.  3. DURATION: "How long does it last" (e.g., seconds, minutes, hours)     One to two seconds 4. PATTERN "Does it come and go, or has it been constant since it started?"  "Does it get worse with exertion?"   "Are you feeling it now?"     Driving this morning when she noticed it today. 5. TAP: "Using your hand, can you tap out what you are feeling on a chair or table in front of you, so that I can hear?" (Note: not all patients can do this)      Driving. 6. HEART RATE: "Can you tell me your heart rate?" "How many beats in 15 seconds?"  (Note: not all patients can do this)       Not at this time.  7. RECURRENT SYMPTOM: "Have you ever had this before?" If so, ask: "When was the last time?" and "What happened that time?"      Two weeks ago. 8. CAUSE: "What do you think is causing the palpitations?"     Stress maybe? 9. CARDIAC HISTORY: "Do you have any history of heart disease?" (e.g., heart attack, angina, bypass surgery, angioplasty, arrhythmia)      Palpitations, blood pressure medication. 10. OTHER SYMPTOMS: "Do you have any other symptoms?" (e.g., dizziness, chest pain, sweating, difficulty breathing)       no 11. PREGNANCY: "Is there any chance you are pregnant?" "When was your last menstrual period?"       no  Protocols used:  HEART RATE AND HEARTBEAT QUESTIONS-A-AH, NAUSEA-A-AH

## 2017-12-13 NOTE — Progress Notes (Signed)
Subjective:    Patient ID: Kristy Becker, female    DOB: [birth year], 67 y.o.   MRN: [id number]  HPI  67 year old patient who has a history of essential hypertension as well as anxiety disorder. She presents today with a chief complaint of a heart problem and requesting referral to cardiology. She describes occasional heart fluttering and skipped beats.  The occasional heart irregularly has caused considerable anxiety.  She denies any sustained tachyarrhythmias and no associated symptoms. She does consume chocolate but very little in the way of caffeine use.  She admits to significant situational stress with the poor health of her husband.  Palpitations have been present for approximately 3 weeks She also has had some right ankle and foot edema and the patient was concerned about this being a sign of cardiac disease.  Prior EKGs reviewed.  At times she has exhibited a borderline short PR interval.  She has poor R wave progression.  Some EKGs have revealed a QS pattern in V1 and V2 and prior anteroseptal MI cannot be excluded.  Past Medical History:  Diagnosis Date  . ALLERGIC RHINITIS 10/06/2007  . ANXIETY 01/31/2007  . BACK PAIN 08/21/2007  . COLONIC POLYPS, HX OF 02/26/2009  . ECZEMA, HANDS 05/08/2007  . HYPERTENSION 01/31/2007     Social History   Socioeconomic History  . Marital status: Married    Spouse name: Not on file  . Number of children: Not on file  . Years of education: Not on file  . Highest education level: Not on file  Occupational History  . Not on file  Social Needs  . Financial resource strain: Not on file  . Food insecurity:    Worry: Not on file    Inability: Not on file  . Transportation needs:    Medical: Not on file    Non-medical: Not on file  Tobacco Use  . Smoking status: Never Smoker  . Smokeless tobacco: Never Used  Substance and Sexual Activity  . Alcohol use: No  . Drug use: No  . Sexual activity: Not on file  Lifestyle  . Physical  activity:    Days per week: Not on file    Minutes per session: Not on file  . Stress: Not on file  Relationships  . Social connections:    Talks on phone: Not on file    Gets together: Not on file    Attends religious service: Not on file    Active member of club or organization: Not on file    Attends meetings of clubs or organizations: Not on file    Relationship status: Not on file  . Intimate partner violence:    Fear of current or ex partner: Not on file    Emotionally abused: Not on file    Physically abused: Not on file    Forced sexual activity: Not on file  Other Topics Concern  . Not on file  Social History Narrative  . Not on file    Past Surgical History:  Procedure Laterality Date  . TONSILLECTOMY AND ADENOIDECTOMY      History reviewed. No pertinent family history.  Allergies  Allergen Reactions  . Azithromycin     nausea  . Cephalosporins Other (See Comments)    unknown  . Clarithromycin Other (See Comments)    unknown  . Doxycycline Hyclate Other (See Comments)  . Penicillins Hives    Current Outpatient Medications on File Prior to Visit  Medication Sig Dispense Refill  .  amLODipine (NORVASC) 2.5 MG tablet Take 1 tablet (2.5 mg total) by mouth daily. 90 tablet 3  . benazepril-hydrochlorthiazide (LOTENSIN HCT) 20-12.5 MG tablet Take 1 tablet by mouth daily. 90 tablet 1  . Bepotastine Besilate (BEPREVE) 1.5 % SOLN Place 1 drop into both eyes as needed.    . famotidine (PEPCID) 20 MG tablet Take 20 mg by mouth 2 (two) times daily.    . fexofenadine (ALLEGRA) 180 MG tablet Take 180 mg by mouth daily as needed.  4  . MOMETASONE FUROATE NA Place 2 sprays into the nose daily. 50 mcg    . Multiple Vitamin (MULTIVITAMIN WITH MINERALS) TABS Take 1 tablet by mouth daily.    . Olopatadine HCl 0.6 % SOLN Place 2 puffs into the nose as needed.    . triamcinolone cream (KENALOG) 0.1 % Apply 1 application topically 2 (two) times daily. 30 g 0   No current  facility-administered medications on file prior to visit.     BP 130/68 (BP Location: Right Arm, Patient Position: Sitting, Cuff Size: Large)   Pulse 61   Temp 99.1 F (37.3 C) (Oral)   Wt 159 lb (72.1 kg)   SpO2 97%   BMI 30.04 kg/m     Review of Systems  Constitutional: Negative.   HENT: Negative for congestion, dental problem, hearing loss, rhinorrhea, sinus pressure, sore throat and tinnitus.   Eyes: Negative for pain, discharge and visual disturbance.  Respiratory: Negative for cough and shortness of breath.   Cardiovascular: Positive for palpitations. Negative for chest pain and leg swelling.  Gastrointestinal: Negative for abdominal distention, abdominal pain, blood in stool, constipation, diarrhea, nausea and vomiting.  Genitourinary: Negative for difficulty urinating, dysuria, flank pain, frequency, hematuria, pelvic pain, urgency, vaginal bleeding, vaginal discharge and vaginal pain.  Musculoskeletal: Negative for arthralgias, gait problem and joint swelling.  Skin: Negative for rash.  Neurological: Negative for dizziness, syncope, speech difficulty, weakness, numbness and headaches.  Hematological: Negative for adenopathy.  Psychiatric/Behavioral: Positive for behavioral problems. Negative for agitation and dysphoric mood. The patient is nervous/anxious.        Objective:   Physical Exam  Constitutional: She appears well-developed and well-nourished. No distress.  Blood pressure 130/70  Cardiovascular: Normal rate, regular rhythm and normal heart sounds.  No murmur heard. Pulmonary/Chest: Breath sounds normal.  Musculoskeletal: She exhibits edema.  Minimal right ankle and pedal edema          Assessment & Plan:   Palpitations.  These appear to be benign and not associated with any tachyarrhythmias or presyncopal symptoms,  chest pain or shortness of breath.  Attempts at reassurance were only partially successful and she wishes to pursue cardiology  consultation.  This will be scheduled.  Patient information dispensed and discussed.  She was told to moderate her chocolate use and attempt to minimize her situational stress.  She is seeking to obtain some in-home help for her disabled husband.   borderline short PR interval.  Doubt LGL or other preexcitation syndrome   history of essential hypertension.  Blood pressure presently well controlled.  If palpitations persist may consider use of beta-blocker therapy  Anxiety disorder  Marletta Lor

## 2017-12-13 NOTE — Patient Instructions (Addendum)
Palpitations A palpitation is the feeling that your heart:  Has an uneven (irregular) heartbeat.  Is beating faster than normal.  Is fluttering.  Is skipping a beat.  This is usually not a serious problem. In some cases, you may need more medical tests. Follow these instructions at home:  Avoid: ? Caffeine in coffee, tea, soft drinks, diet pills, and energy drinks. ? Chocolate. ? Alcohol.  Do not use any tobacco products. These include cigarettes, chewing tobacco, and e-cigarettes. If you need help quitting, ask your doctor.  Try to reduce your stress. These things may help: ? Yoga. ? Meditation. ? Physical activity. Swimming, jogging, and walking are good choices. ? A method that helps you use your mind to control things in your body, like heartbeats (biofeedback).  Get plenty of rest and sleep.  Take over-the-counter and prescription medicines only as told by your doctor.  Keep all follow-up visits as told by your doctor. This is important. Contact a doctor if:  Your heartbeat is still fast or uneven after 24 hours.  Your palpitations occur more often. Get help right away if:  You have chest pain.  You feel short of breath.  You have a very bad headache.  You feel dizzy.  You pass out (faint). This information is not intended to replace advice given to you by your health care provider. Make sure you discuss any questions you have with your health care provider.  Cardiology consultation as discussed

## 2018-01-27 ENCOUNTER — Encounter (INDEPENDENT_AMBULATORY_CARE_PROVIDER_SITE_OTHER): Payer: Self-pay

## 2018-01-27 ENCOUNTER — Encounter: Payer: Self-pay | Admitting: Cardiology

## 2018-01-27 ENCOUNTER — Ambulatory Visit: Payer: Medicare Other | Admitting: Cardiology

## 2018-01-27 VITALS — BP 153/86 | HR 69 | Ht 61.0 in | Wt 159.4 lb

## 2018-01-27 DIAGNOSIS — I10 Essential (primary) hypertension: Secondary | ICD-10-CM | POA: Diagnosis not present

## 2018-01-27 DIAGNOSIS — Z7189 Other specified counseling: Secondary | ICD-10-CM

## 2018-01-27 DIAGNOSIS — R002 Palpitations: Secondary | ICD-10-CM

## 2018-01-27 NOTE — Progress Notes (Signed)
Cardiology Office Note:    Date:  01/27/2018   ID:  Kristy Becker, DOB December 18, 1950, MRN [id number]  PCP:  Marletta Lor, MD  Cardiologist:  Buford Dresser, MD PhD  Referring MD: Marletta Lor, MD   Chief complaint: palpitations  History of Present Illness:    Kristy Becker is a 67 y.o. female with a hx of hypertension who is seen as a new consult at the request of Dr. Burnice Logan for the evaluation and management of palpitations.   Patient's concerns: Husband has had Parkinson's for 16 years, she has been his caretaker. He was ill with flu but recovered, but then he had a stroke one week ago. She has been stressed and having panic attacks while dealing with his health. Has felt like her heart is irregular beats, hard beats on occasion. Doesn't happen daily, but happens a few times a week. No associated symptoms. Minimal caffeine intake, no tobacco.  No personal history of heart disease. No chest pain, shortness of breath, PND, orthopnea. No sleep apnea/CPAP history. Doesn't think she snores. No weight changes. No syncope. Had presyncope years ago. Has mild LE swelling in right ankle, has broken ankle twice. Blood pressure has been controlled on recent visits, she notes that she is feeling very anxious at today's visit.  SH: Never smoker. Active taking care of her husband, but no time for the gym in the last month because of her husband's care.   FH: No history of MI, mom had a stroke.  Past Medical History:  Diagnosis Date  . ALLERGIC RHINITIS 10/06/2007  . ANXIETY 01/31/2007  . BACK PAIN 08/21/2007  . COLONIC POLYPS, HX OF 02/26/2009  . ECZEMA, HANDS 05/08/2007  . HYPERTENSION 01/31/2007    Past Surgical History:  Procedure Laterality Date  . TONSILLECTOMY AND ADENOIDECTOMY      Current Medications: Current Outpatient Medications on File Prior to Visit  Medication Sig  . amLODipine (NORVASC) 2.5 MG tablet Take 1 tablet (2.5 mg total) by mouth daily.  .  benazepril-hydrochlorthiazide (LOTENSIN HCT) 20-12.5 MG tablet Take 1 tablet by mouth daily.  . Ca Phosphate-Cholecalciferol (CALTRATE GUMMY BITES PO) Take 1 tablet by mouth every morning.  . famotidine (PEPCID) 20 MG tablet Take 20 mg by mouth 2 (two) times daily.  . fexofenadine (ALLEGRA) 180 MG tablet Take 180 mg by mouth daily as needed.  . MOMETASONE FUROATE NA Place 2 sprays into the nose daily. 50 mcg  . Multiple Vitamin (MULTIVITAMIN WITH MINERALS) TABS Take 1 tablet by mouth daily.  . Olopatadine HCl 0.6 % SOLN Place 2 puffs into the nose as needed.  . triamcinolone cream (KENALOG) 0.1 % Apply 1 application topically 2 (two) times daily.   No current facility-administered medications on file prior to visit.      Allergies:   Azithromycin; Cephalosporins; Clarithromycin; Doxycycline hyclate; and Penicillins   Social History   Socioeconomic History  . Marital status: Married    Spouse name: Not on file  . Number of children: Not on file  . Years of education: Not on file  . Highest education level: Not on file  Occupational History  . Not on file  Social Needs  . Financial resource strain: Not on file  . Food insecurity:    Worry: Not on file    Inability: Not on file  . Transportation needs:    Medical: Not on file    Non-medical: Not on file  Tobacco Use  . Smoking status: Never Smoker  .  Smokeless tobacco: Never Used  Substance and Sexual Activity  . Alcohol use: No  . Drug use: No  . Sexual activity: Not on file  Lifestyle  . Physical activity:    Days per week: Not on file    Minutes per session: Not on file  . Stress: Not on file  Relationships  . Social connections:    Talks on phone: Not on file    Gets together: Not on file    Attends religious service: Not on file    Active member of club or organization: Not on file    Attends meetings of clubs or organizations: Not on file    Relationship status: Not on file  Other Topics Concern  . Not on file    Social History Narrative  . Not on file     Family History: The patient's family is negative for cardiac disease. Mother had a CVA  ROS:   Please see the history of present illness.  Additional pertinent ROS: Review of Systems  Constitutional: Negative for chills, diaphoresis and fever.  HENT: Negative for ear pain and hearing loss.   Eyes: Negative for blurred vision and pain.  Respiratory: Negative for shortness of breath and wheezing.   Cardiovascular: Positive for palpitations and leg swelling. Negative for chest pain, orthopnea, claudication and PND.  Gastrointestinal: Negative for abdominal pain, blood in stool and melena.  Genitourinary: Negative for dysuria and hematuria.  Musculoskeletal: Negative for falls and myalgias.  Skin: Negative for rash.  Neurological: Negative for focal weakness and loss of consciousness.  Endo/Heme/Allergies: Does not bruise/bleed easily.   EKGs/Labs/Other Studies Reviewed:    The following studies were reviewed today: Recent PCP note, prior labs and ECGs  EKG:  EKG is ordered today.  The ekg ordered today demonstrates normal sinus rhythm  Recent Labs: No results found for requested labs within last 8760 hours.  Recent Lipid Panel     Component Value Date/Time   CHOL 189 10/28/2015 0945   TRIG 64.0 10/28/2015 0945   HDL 56.10 10/28/2015 0945   CHOLHDL 3 10/28/2015 0945   VLDL 12.8 10/28/2015 0945   LDLCALC 120 (H) 10/28/2015 0945   LDLDIRECT 143.7 02/20/2010 0813    Physical Exam:    VS:  BP (!) 153/86 (BP Location: Left Arm)   Pulse 69   Ht 5\' 1"  (1.549 m)   Wt 159 lb 6.4 oz (72.3 kg)   BMI 30.12 kg/m     Wt Readings from Last 3 Encounters:  01/27/18 159 lb 6.4 oz (72.3 kg)  12/13/17 159 lb (72.1 kg)  12/05/17 159 lb (72.1 kg)    GEN: Well nourished, well developed in no acute distress HEENT: Normal NECK: No JVD; No carotid bruits LYMPHATICS: No lymphadenopathy CARDIAC: regular rhythm, normal S1 and S2, no murmurs,  rubs, gallops. Radial and DP pulses 2+ bilaterally. RESPIRATORY:  Clear to auscultation without rales, wheezing or rhonchi  ABDOMEN: Soft, non-tender, non-distended MUSCULOSKELETAL:  Trace right ankle edema, no left ankle edema; No deformity  SKIN: Warm and dry NEUROLOGIC:  Alert and oriented x 3 PSYCHIATRIC:  Normal affect   ASSESSMENT:    1. Palpitation   2. Essential hypertension   3. Counseling on health promotion and disease prevention    PLAN:    1. Palpitations: very anxiety-provoking for her. She wants to make sure there is nothing worrisome with her health so that she can focus on the care of her husband. He is currently in SNF given his  recent stroke but should be returning home soon. She is trying to ask for help and give herself a break to avoid increasing her stress level -will order 14 day event monitor to rule out arrhythmia  2. Hypertension: elevated today, but endorses good control and has recent visits with goal BP  -continue current meds, continue to monitor  3. Primary cardiovascular prevention: Does not have very recent lipids, and BP is elevated today. Using these numbers, her ASCVD risk score is elevated: The 10-year ASCVD risk score Mikey Bussing DC Brooke Bonito., et al., 2013) is: 14.7%   Values used to calculate the score:     Age: 64 years     Sex: Female     Is Non-Hispanic African American: Yes     Diabetic: No     Tobacco smoker: No     Systolic Blood Pressure: 009 mmHg     Is BP treated: Yes     HDL Cholesterol: 56.1 mg/dL     Total Cholesterol: 189 mg/dL  However, will address one issue at a time. Once palpitations are evaluated, can recheck lipids, follow up on blood pressure and recalculate her score. Suspect even with improvement in her parameters, she will be over the 7.5% threshold, and a discussion re: statins could be warranted. This can also be a continued conversation with her primary care physician if she prefers.  Plan for follow up: to be determined  based on the results of her testing.  Medication Adjustments/Labs and Tests Ordered: Current medicines are reviewed at length with the patient today.  Concerns regarding medicines are outlined above.  Orders Placed This Encounter  Procedures  . CARDIAC EVENT MONITOR  . EKG 12-Lead   No orders of the defined types were placed in this encounter.   Patient Instructions  Medication Instructions: Your physician recommends that you continue on your current medications as directed.    If you need a refill on your cardiac medications before your next appointment, please call your pharmacy.   Labwork: None  Procedures/Testing: Your physician has recommended that you wear an event monitor for 2 weeks. Event monitors are medical devices that record the heart's electrical activity. Doctors most often [place] these monitors to diagnose arrhythmias. Arrhythmias are problems with the speed or rhythm of the heartbeat. The monitor is a small, portable device. You can wear one while you do your normal daily activities. This is usually used to diagnose what is causing palpitations/syncope (passing out). [place] [address]   Follow-Up: Your physician wants you to follow-up as needed with Dr. Harrell Gave. Call our office at [phone] to schedule appointment.   Special Instructions:    Thank you for choosing [facility] at [facility]!!        Signed, Buford Dresser, MD PhD 01/27/2018 [place]

## 2018-01-27 NOTE — Patient Instructions (Signed)
Medication Instructions: Your physician recommends that you continue on your current medications as directed.    If you need a refill on your cardiac medications before your next appointment, please call your pharmacy.   Labwork: None  Procedures/Testing: Your physician has recommended that you wear an event monitor for 2 weeks. Event monitors are medical devices that record the heart's electrical activity. Doctors most often [place] these monitors to diagnose arrhythmias. Arrhythmias are problems with the speed or rhythm of the heartbeat. The monitor is a small, portable device. You can wear one while you do your normal daily activities. This is usually used to diagnose what is causing palpitations/syncope (passing out). [place] [address]   Follow-Up: Your physician wants you to follow-up as needed with Dr. Harrell Gave. Call our office at [phone] to schedule appointment.   Special Instructions:    Thank you for choosing [facility] at [facility]!!

## 2018-01-30 ENCOUNTER — Encounter: Payer: Self-pay | Admitting: Internal Medicine

## 2018-01-30 ENCOUNTER — Ambulatory Visit: Payer: Medicare Other | Admitting: Internal Medicine

## 2018-01-30 VITALS — BP 120/58 | HR 75 | Temp 98.5°F | Wt 161.0 lb

## 2018-01-30 DIAGNOSIS — F411 Generalized anxiety disorder: Secondary | ICD-10-CM

## 2018-01-30 DIAGNOSIS — I10 Essential (primary) hypertension: Secondary | ICD-10-CM | POA: Diagnosis not present

## 2018-01-30 NOTE — Progress Notes (Signed)
Subjective:    Patient ID: Kristy Becker, female    DOB: [birth year], 67 y.o.   MRN: [id number]  HPI  67 year old patient who is seen today for follow-up of essential hypertension. More recently she has had some issues with palpitations and has been seen by cardiology.  She is scheduled for placement of a 2-week event monitor. Since her last visit here, her husband has been hospitalized for a CVA approximately 2 weeks ago he presently is doing better and is receiving inpatient rehab. Patient states that her stress level is much improved and she has had no further palpitations.  Past Medical History:  Diagnosis Date  . ALLERGIC RHINITIS 10/06/2007  . ANXIETY 01/31/2007  . BACK PAIN 08/21/2007  . COLONIC POLYPS, HX OF 02/26/2009  . ECZEMA, HANDS 05/08/2007  . HYPERTENSION 01/31/2007     Social History   Socioeconomic History  . Marital status: Married    Spouse name: Not on file  . Number of children: Not on file  . Years of education: Not on file  . Highest education level: Not on file  Occupational History  . Not on file  Social Needs  . Financial resource strain: Not on file  . Food insecurity:    Worry: Not on file    Inability: Not on file  . Transportation needs:    Medical: Not on file    Non-medical: Not on file  Tobacco Use  . Smoking status: Never Smoker  . Smokeless tobacco: Never Used  Substance and Sexual Activity  . Alcohol use: No  . Drug use: No  . Sexual activity: Not on file  Lifestyle  . Physical activity:    Days per week: Not on file    Minutes per session: Not on file  . Stress: Not on file  Relationships  . Social connections:    Talks on phone: Not on file    Gets together: Not on file    Attends religious service: Not on file    Active member of club or organization: Not on file    Attends meetings of clubs or organizations: Not on file    Relationship status: Not on file  . Intimate partner violence:    Fear of current or ex partner:  Not on file    Emotionally abused: Not on file    Physically abused: Not on file    Forced sexual activity: Not on file  Other Topics Concern  . Not on file  Social History Narrative  . Not on file    Past Surgical History:  Procedure Laterality Date  . TONSILLECTOMY AND ADENOIDECTOMY      History reviewed. No pertinent family history.  Allergies  Allergen Reactions  . Azithromycin     nausea  . Cephalosporins Other (See Comments)    unknown  . Clarithromycin Other (See Comments)    unknown  . Doxycycline Hyclate Other (See Comments)  . Penicillins Hives    Current Outpatient Medications on File Prior to Visit  Medication Sig Dispense Refill  . amLODipine (NORVASC) 2.5 MG tablet Take 1 tablet (2.5 mg total) by mouth daily. 90 tablet 3  . benazepril-hydrochlorthiazide (LOTENSIN HCT) 20-12.5 MG tablet Take 1 tablet by mouth daily. 90 tablet 1  . Ca Phosphate-Cholecalciferol (CALTRATE GUMMY BITES PO) Take 1 tablet by mouth every morning.    . famotidine (PEPCID) 20 MG tablet Take 20 mg by mouth 2 (two) times daily.    . fexofenadine (ALLEGRA) 180 MG tablet  Take 180 mg by mouth daily as needed.  4  . MOMETASONE FUROATE NA Place 2 sprays into the nose daily. 50 mcg    . Multiple Vitamin (MULTIVITAMIN WITH MINERALS) TABS Take 1 tablet by mouth daily.    . Olopatadine HCl 0.6 % SOLN Place 2 puffs into the nose as needed.    . triamcinolone cream (KENALOG) 0.1 % Apply 1 application topically 2 (two) times daily. 30 g 0   No current facility-administered medications on file prior to visit.     BP (!) 120/58 (BP Location: Right Arm, Patient Position: Sitting, Cuff Size: Large)   Pulse 75   Temp 98.5 F (36.9 C) (Oral)   Wt 161 lb (73 kg)   SpO2 98%   BMI 30.42 kg/m     Review of Systems  Constitutional: Negative.   HENT: Negative for congestion, dental problem, hearing loss, rhinorrhea, sinus pressure, sore throat and tinnitus.   Eyes: Negative for pain, discharge and  visual disturbance.  Respiratory: Negative for cough and shortness of breath.   Cardiovascular: Positive for palpitations. Negative for chest pain and leg swelling.  Gastrointestinal: Negative for abdominal distention, abdominal pain, blood in stool, constipation, diarrhea, nausea and vomiting.  Genitourinary: Negative for difficulty urinating, dysuria, flank pain, frequency, hematuria, pelvic pain, urgency, vaginal bleeding, vaginal discharge and vaginal pain.  Musculoskeletal: Negative for arthralgias, gait problem and joint swelling.  Skin: Negative for rash.  Neurological: Negative for dizziness, syncope, speech difficulty, weakness, numbness and headaches.  Hematological: Negative for adenopathy.  Psychiatric/Behavioral: Negative for agitation, behavioral problems and dysphoric mood. The patient is nervous/anxious.        Objective:   Physical Exam  Constitutional: She is oriented to person, place, and time. She appears well-developed and well-nourished.  HENT:  Head: Normocephalic.  Right Ear: External ear normal.  Left Ear: External ear normal.  Mouth/Throat: Oropharynx is clear and moist.  Eyes: Pupils are equal, round, and reactive to light. Conjunctivae and EOM are normal.  Neck: Normal range of motion. Neck supple. No thyromegaly present.  Cardiovascular: Normal rate, regular rhythm, normal heart sounds and intact distal pulses.  Pulmonary/Chest: Effort normal and breath sounds normal.  Abdominal: Soft. Bowel sounds are normal. She exhibits no mass. There is no tenderness.  Musculoskeletal: Normal range of motion.  Lymphadenopathy:    She has no cervical adenopathy.  Neurological: She is alert and oriented to person, place, and time.  Skin: Skin is warm and dry. No rash noted.  Psychiatric: She has a normal mood and affect. Her behavior is normal.          Assessment & Plan:   Essential hypertension.  Stable.  No change in therapy Anxiety disorder.   Improved Palpitations stable.  2-week event monitor planned  Follow-up here 6 months or as needed  Marletta Lor

## 2018-01-30 NOTE — Patient Instructions (Signed)
Limit your sodium (Salt) intake  Follow-up with Dr. Volanda Napoleon as scheduled  Cardiology follow-up as scheduled

## 2018-02-06 ENCOUNTER — Ambulatory Visit (INDEPENDENT_AMBULATORY_CARE_PROVIDER_SITE_OTHER): Payer: Medicare Other

## 2018-02-06 DIAGNOSIS — R002 Palpitations: Secondary | ICD-10-CM

## 2018-02-21 ENCOUNTER — Other Ambulatory Visit: Payer: Self-pay | Admitting: Internal Medicine

## 2018-02-27 ENCOUNTER — Telehealth: Payer: Self-pay | Admitting: Cardiology

## 2018-02-27 NOTE — Telephone Encounter (Signed)
PT IS CALLING FOR MONITOR RESULTS

## 2018-02-27 NOTE — Telephone Encounter (Signed)
New Message:   Patient is requesting a follow up call for all testing and procedures.

## 2018-02-28 ENCOUNTER — Telehealth: Payer: Self-pay | Admitting: Cardiology

## 2018-02-28 NOTE — Telephone Encounter (Signed)
Notes recorded by Buford Dresser, MD on 02/28/2018 at 7:58 AM EDT Monitor did not reveal any significant rhythm issues. All 4 events that she pushed for were normal sinus rhythm. Thanks!  Pt notified she is grateful for calling!!

## 2018-02-28 NOTE — Telephone Encounter (Signed)
Follow up ° °Pt returning call for nurse °

## 2018-03-03 IMAGING — CT CT MAXILLOFACIAL W/O CM
3 series · 16 of 40 positions shown, 19 images · non-contrast
Comparison: None.

CLINICAL DATA: Chronic sinusitis for 1 year. Left frontal pressure.

EXAM:
CT MAXILLOFACIAL WITHOUT CONTRAST
TECHNIQUE: Multidetector CT images of the paranasal sinuses were obtained using
the standard protocol without intravenous contrast.

[Series 3: axial soft 1.25 · axial · 0.49mm/px · z∈[-49,-25]mm · 2 of 152 slices shown]
[im 10/152  brain]
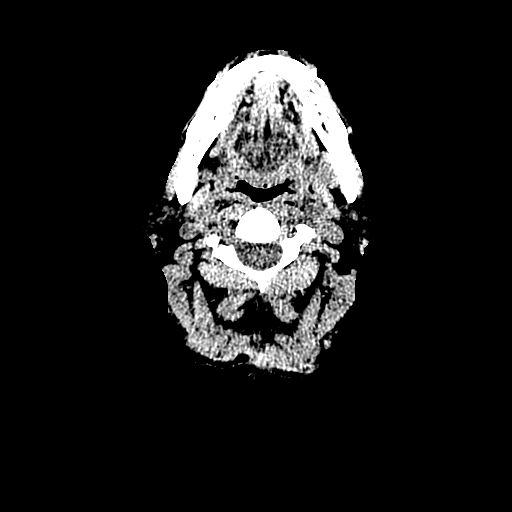
[im 29/152  brain]
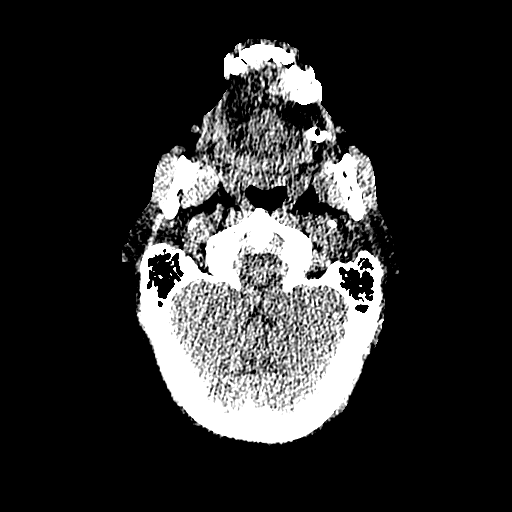

[Series 601: coronal facial · coronal · 0.49mm/px · 3 of 120 slices shown]
[im 40/120  bone]
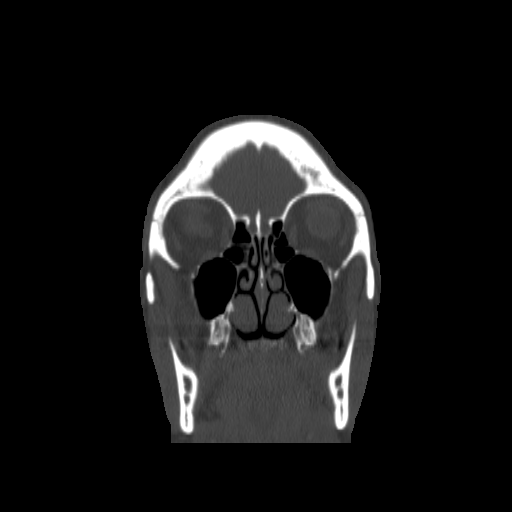
[im 53/120  bone]
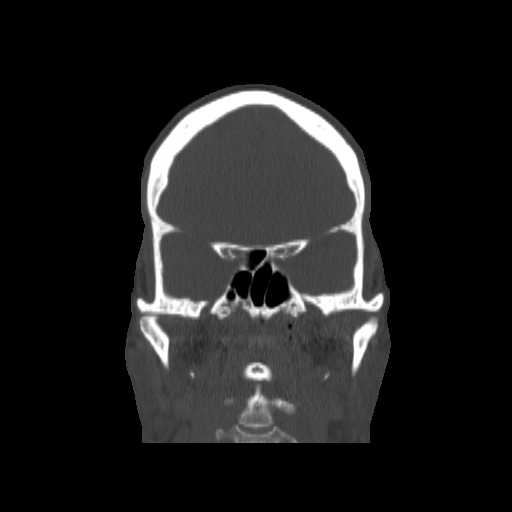
[im 67/120  bone]
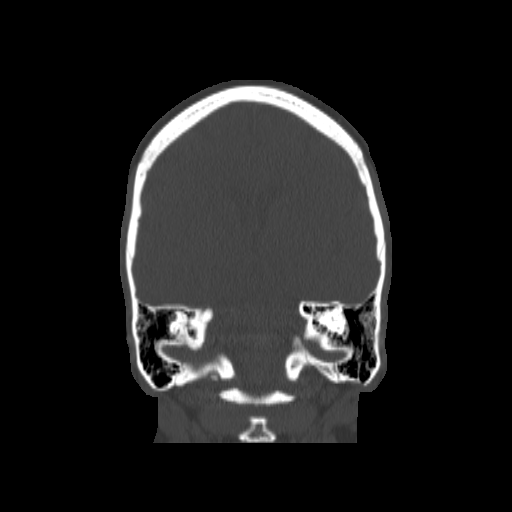

[Series 602: sagittal facial · sagittal · 0.49mm/px · 11 of 109 slices shown, 14 images]
[im 10/109  brain]
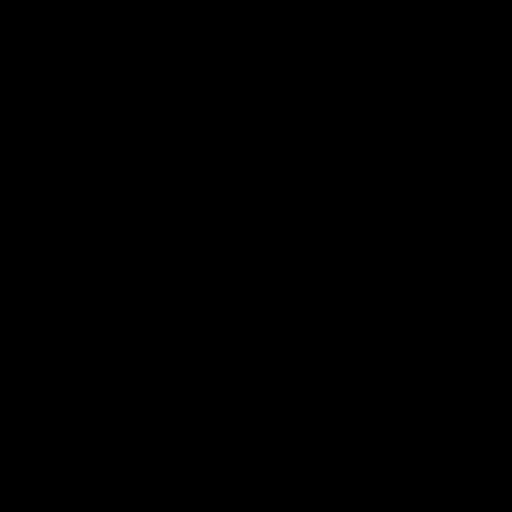
[im 10/109  bone]
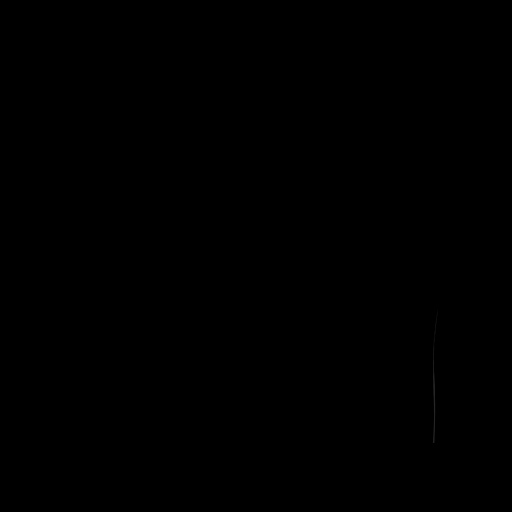
[im 19/109  bone]
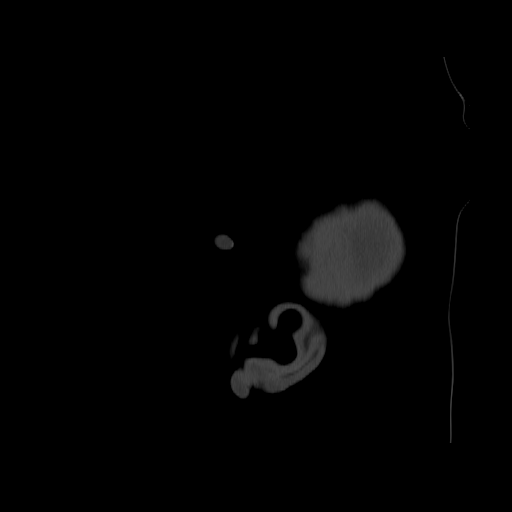
[im 28/109  bone]
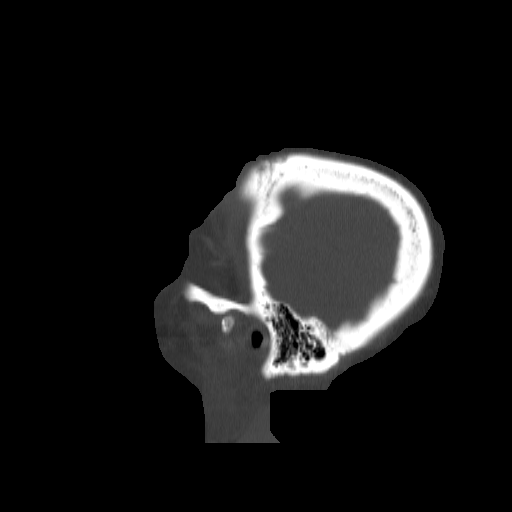
[im 37/109  bone]
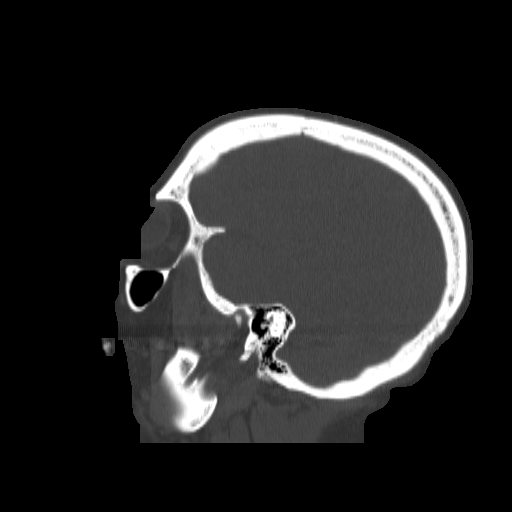
[im 46/109  brain]
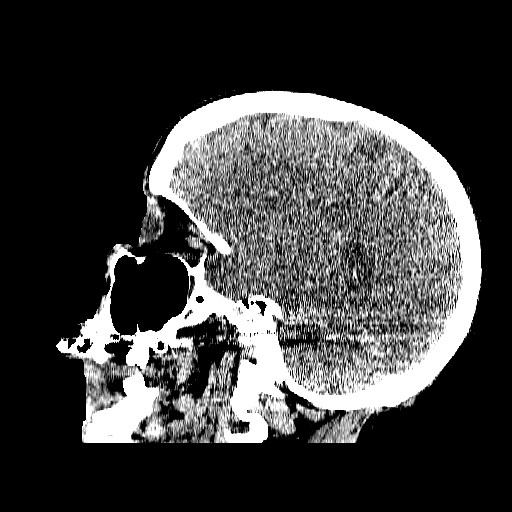
[im 46/109  bone]
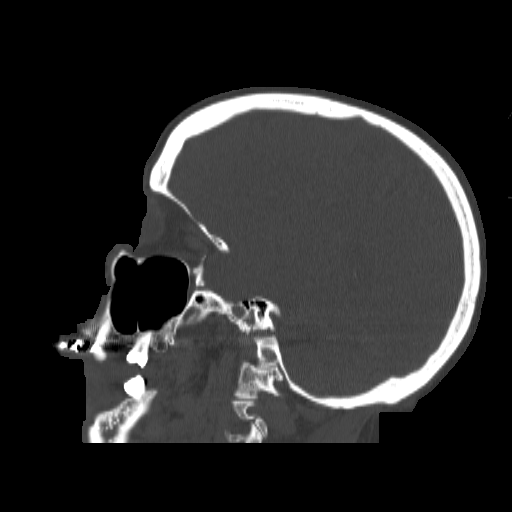
[im 55/109  bone]
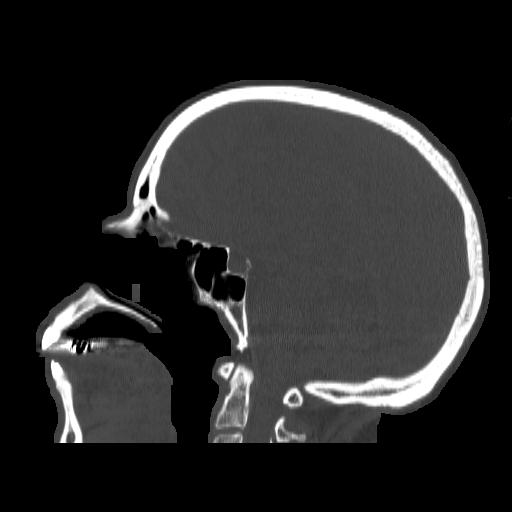
[im 64/109  bone]
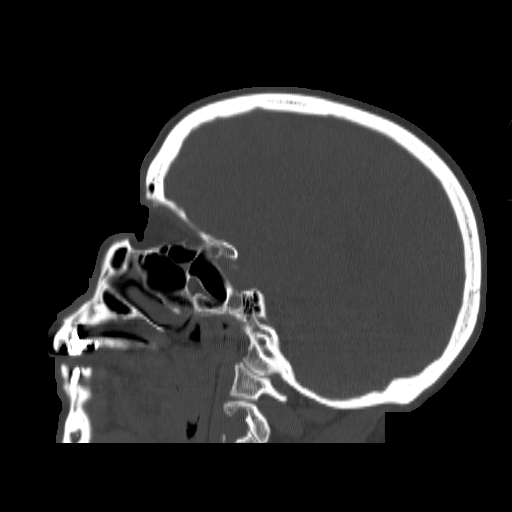
[im 73/109  bone]
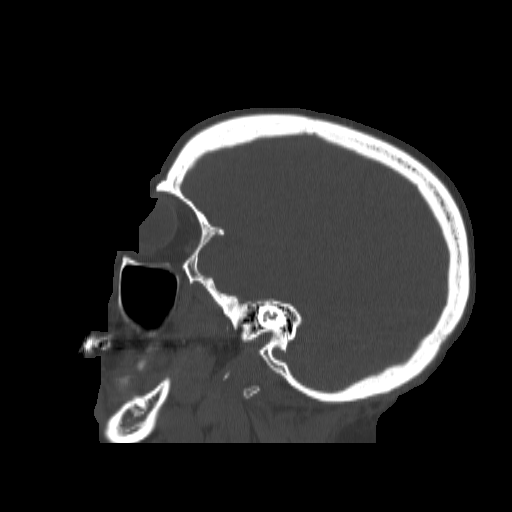
[im 82/109  brain]
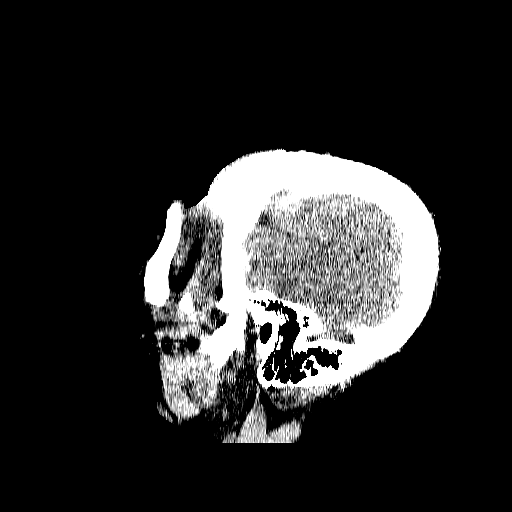
[im 82/109  bone]
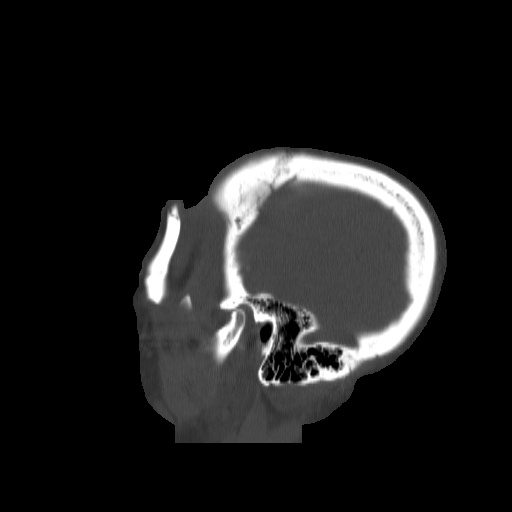
[im 91/109  bone]
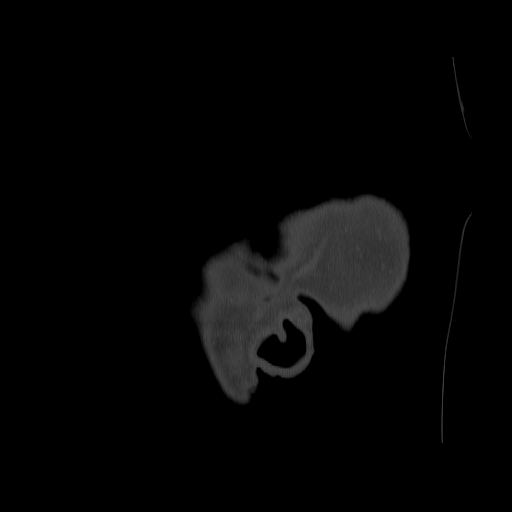
[im 100/109  bone]
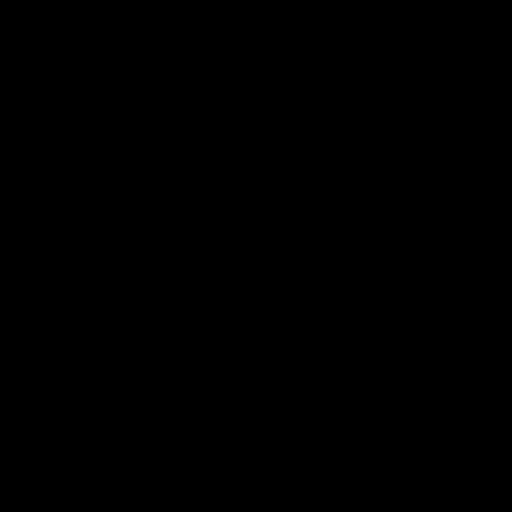

[16 of 40 positions shown; findings below may reference images not displayed]

FINDINGS: Paranasal sinuses:

Frontal: Symmetrically small frontal sinuses that are normally
aerated. Patent frontal ethmoidal recesses.

Ethmoid: Normally aerated.

Maxillary: Normally aerated.

Sphenoid: Normally aerated. Patent sphenoid sinus ostia. Partial
septations in the bilateral air cells.

Right ostiomeatal unit: Bony ridge encroaches on the maxillary
infundibulum and ostium, but patent. Minimal concha bullosa without
asymmetric meatus stenosis.

Left ostiomeatal unit: Infraorbital ridge encroaches on the
infundibulum and ostium. Symmetric patency of the middle meatus.

Nasal passages: Patent. Slight leftward deviation and spur
contacting the edematous appearing inferior turbinate.

Anatomy: No pneumatization superior to anterior ethmoid notches.
Symmetrically intact olfactory grooves and fovea ethmoidalis, Keros
II (4-7mm) Sellar sphenoid pneumatization pattern. No dehiscence of
carotid or optic canals. No onodi cell.

Other: Orbits and intracranial compartment are unremarkable. Visible
mastoid air cells are normally aerated.
IMPRESSION: 1.  No active sinusitis or sinus obstruction.
2. Small bilateral infraorbital ridges encroach on the maxillary
infundibula.
3. Tiny leftward nasal septal spur contacting the edematous inferior
turbinate.

## 2018-04-24 ENCOUNTER — Other Ambulatory Visit: Payer: Self-pay | Admitting: Gastroenterology

## 2018-04-24 DIAGNOSIS — R140 Abdominal distension (gaseous): Secondary | ICD-10-CM

## 2018-04-24 DIAGNOSIS — R143 Flatulence: Secondary | ICD-10-CM

## 2018-05-01 ENCOUNTER — Encounter: Payer: Self-pay | Admitting: Family Medicine

## 2018-05-01 ENCOUNTER — Ambulatory Visit: Payer: Medicare Other | Admitting: Family Medicine

## 2018-05-01 VITALS — BP 128/74 | HR 76 | Temp 97.7°F | Ht 61.0 in | Wt 159.0 lb

## 2018-05-01 DIAGNOSIS — Z23 Encounter for immunization: Secondary | ICD-10-CM

## 2018-05-01 DIAGNOSIS — I10 Essential (primary) hypertension: Secondary | ICD-10-CM | POA: Diagnosis not present

## 2018-05-01 DIAGNOSIS — J301 Allergic rhinitis due to pollen: Secondary | ICD-10-CM | POA: Diagnosis not present
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
## 2018-05-01 NOTE — Patient Instructions (Addendum)
Allergic Rhinitis, Adult Allergic rhinitis is an allergic reaction that affects the mucous membrane inside the nose. It causes sneezing, a runny or stuffy nose, and the feeling of mucus going down the back of the throat (postnasal drip). Allergic rhinitis can be mild to severe. There are two types of allergic rhinitis:  Seasonal. This type is also called hay fever. It happens only during certain seasons.  Perennial. This type can happen at any time of the year.  What are the causes? This condition happens when the body's defense system (immune system) responds to certain harmless substances called allergens as though they were germs.  Seasonal allergic rhinitis is triggered by pollen, which can come from grasses, trees, and weeds. Perennial allergic rhinitis may be caused by:  House dust mites.  Pet dander.  Mold spores.  What are the signs or symptoms? Symptoms of this condition include:  Sneezing.  Runny or stuffy nose (nasal congestion).  Postnasal drip.  Itchy nose.  Tearing of the eyes.  Trouble sleeping.  Daytime sleepiness.  How is this diagnosed? This condition may be diagnosed based on:  Your medical history.  A physical exam.  Tests to check for related conditions, such as: ? Asthma. ? Pink eye. ? Ear infection. ? Upper respiratory infection.  Tests to find out which allergens trigger your symptoms. These may include skin or blood tests.  How is this treated? There is no cure for this condition, but treatment can help control symptoms. Treatment may include:  Taking medicines that block allergy symptoms, such as antihistamines. Medicine may be given as a shot, nasal spray, or pill.  Avoiding the allergen.  Desensitization. This treatment involves getting ongoing shots until your body becomes less sensitive to the allergen. This treatment may be done if other treatments do not help.  If taking medicine and avoiding the allergen does not work, new,  stronger medicines may be prescribed.  Follow these instructions at home:  Find out what you are allergic to. Common allergens include smoke, dust, and pollen.  Avoid the things you are allergic to. These are some things you can do to help avoid allergens: ? Replace carpet with wood, tile, or vinyl flooring. Carpet can trap dander and dust. ? Do not smoke. Do not allow smoking in your home. ? Change your heating and air conditioning filter at least once a month. ? During allergy season:  Keep windows closed as much as possible.  Plan outdoor activities when pollen counts are lowest. This is usually during the evening hours.  When coming indoors, change clothing and shower before sitting on furniture or bedding.  Take over-the-counter and prescription medicines only as told by your health care provider.  Keep all follow-up visits as told by your health care provider. This is important. Contact a health care provider if:  You have a fever.  You develop a persistent cough.  You make whistling sounds when you breathe (you wheeze).  Your symptoms interfere with your normal daily activities. Get help right away if:  You have shortness of breath. Summary  This condition can be managed by taking medicines as directed and avoiding allergens.  Contact your health care provider if you develop a persistent cough or fever.  During allergy season, keep windows closed as much as possible. This information is not intended to replace advice given to you by your health care provider. Make sure you discuss any questions you have with your health care provider. Document Released: 02/23/2001 Document Revised: 07/08/2016  Document Reviewed: 07/08/2016 Elsevier Interactive Patient Education  2018 [place] and Stress Management Stress is a normal reaction to life events. It is what you feel when life demands more than you are used to or more than you can handle. Some stress can be  useful. For example, the stress reaction can help you catch the last bus of the day, study for a test, or meet a deadline at work. But stress that occurs too often or for too long can cause problems. It can affect your emotional health and interfere with relationships and normal daily activities. Too much stress can weaken your immune system and increase your risk for physical illness. If you already have a medical problem, stress can make it worse. What are the causes? All sorts of life events may cause stress. An event that causes stress for one person may not be stressful for another person. Major life events commonly cause stress. These may be positive or negative. Examples include losing your job, moving into a new home, getting married, having a baby, or losing a loved one. Less obvious life events may also cause stress, especially if they occur day after day or in combination. Examples include working long hours, driving in traffic, caring for children, being in debt, or being in a difficult relationship. What are the signs or symptoms? Stress may cause emotional symptoms including, the following:  Anxiety. This is feeling worried, afraid, on edge, overwhelmed, or out of control.  Anger. This is feeling irritated or impatient.  Depression. This is feeling sad, down, helpless, or guilty.  Difficulty focusing, remembering, or making decisions.  Stress may cause physical symptoms, including the following:  Aches and pains. These may affect your head, neck, back, stomach, or other areas of your body.  Tight muscles or clenched jaw.  Low energy or trouble sleeping.  Stress may cause unhealthy behaviors, including the following:  Eating to feel better (overeating) or skipping meals.  Sleeping too little, too much, or both.  Working too much or putting off tasks (procrastination).  Smoking, drinking alcohol, or using drugs to feel better.  How is this diagnosed? Stress is diagnosed  through an assessment by your health care provider. Your health care provider will ask questions about your symptoms and any stressful life events.Your health care provider will also ask about your medical history and may order blood tests or other tests. Certain medical conditions and medicine can cause physical symptoms similar to stress. Mental illness can cause emotional symptoms and unhealthy behaviors similar to stress. Your health care provider may refer you to a mental health professional for further evaluation. How is this treated? Stress management is the recommended treatment for stress.The goals of stress management are reducing stressful life events and coping with stress in healthy ways. Techniques for reducing stressful life events include the following:  Stress identification. Self-monitor for stress and identify what causes stress for you. These skills may help you to avoid some stressful events.  Time management. Set your priorities, keep a calendar of events, and learn to say "no." These tools can help you avoid making too many commitments.  Techniques for coping with stress include the following:  Rethinking the problem. Try to think realistically about stressful events rather than ignoring them or overreacting. Try to find the positives in a stressful situation rather than focusing on the negatives.  Exercise. Physical exercise can release both physical and emotional tension. The key is to find a form of exercise you  enjoy and do it regularly.  Relaxation techniques. These relax the body and mind. Examples include yoga, meditation, tai chi, biofeedback, deep breathing, progressive muscle relaxation, listening to music, being out in nature, journaling, and other hobbies. Again, the key is to find one or more that you enjoy and can do regularly.  Healthy lifestyle. Eat a balanced diet, get plenty of sleep, and do not smoke. Avoid using alcohol or drugs to relax.  Strong  support network. Spend time with family, friends, or other people you enjoy being around.Express your feelings and talk things over with someone you trust.  Counseling or talktherapy with a mental health professional may be helpful if you are having difficulty managing stress on your own. Medicine is typically not recommended for the treatment of stress.Talk to your health care provider if you think you need medicine for symptoms of stress. Follow these instructions at home:  Keep all follow-up visits as directed by your health care provider.  Take all medicines as directed by your health care provider. Contact a health care provider if:  Your symptoms get worse or you start having new symptoms.  You feel overwhelmed by your problems and can no longer manage them on your own. Get help right away if:  You feel like hurting yourself or someone else. This information is not intended to replace advice given to you by your health care provider. Make sure you discuss any questions you have with your health care provider. Document Released: 11/24/2000 Document Revised: 11/06/2015 Document Reviewed: 01/23/2013 Elsevier Interactive Patient Education  2017 [employer].

## 2018-05-02 ENCOUNTER — Ambulatory Visit
Admission: RE | Admit: 2018-05-02 | Discharge: 2018-05-02 | Disposition: A | Discharge status: Home / Self Care | Payer: Medicare Other | Source: Ambulatory Visit | Attending: Gastroenterology | Admitting: Gastroenterology

## 2018-05-02 DIAGNOSIS — R140 Abdominal distension (gaseous): Secondary | ICD-10-CM

## 2018-05-02 DIAGNOSIS — R143 Flatulence: Secondary | ICD-10-CM

## 2018-07-14 ENCOUNTER — Ambulatory Visit: Payer: Medicare Other | Admitting: Family Medicine

## 2018-07-14 ENCOUNTER — Encounter: Payer: Self-pay | Admitting: Family Medicine

## 2018-07-14 VITALS — BP 130/78 | HR 74 | Temp 98.5°F | Wt 158.0 lb

## 2018-07-14 DIAGNOSIS — J3089 Other allergic rhinitis: Secondary | ICD-10-CM

## 2018-07-14 DIAGNOSIS — R410 Disorientation, unspecified: Secondary | ICD-10-CM

## 2018-07-14 NOTE — Progress Notes (Signed)
Subjective:    Patient ID: Kristy Becker, female    DOB: 19-May-1951, 68 y.o.   MRN: [id number]  No chief complaint on file.   HPI Patient was seen today for ongoing concern.  Pt endorses being seen by ENT.  Pt was treated for sinus infection with clarithromycin, nasal spray.  P states after symptoms resolved she developed pruritus.  Pt was advised by ENT to change all soaps and detergents to unscented.  Pt was given hydroxyzine which she has been taking every 8 hours.  Pt now with "brain fog".  Feels unsteady with walking and does not feel safe when driving as a misjudging distances.  Pt stopped the nasal spray as were causing epistaxis.  Now using Ayr nasal gel.  Per pt she was advised to follow-up with her PCP.  Past Medical History:  Diagnosis Date  . ALLERGIC RHINITIS 10/06/2007  . ANXIETY 01/31/2007  . BACK PAIN 08/21/2007  . COLONIC POLYPS, HX OF 02/26/2009  . ECZEMA, HANDS 05/08/2007  . HYPERTENSION 01/31/2007    Allergies  Allergen Reactions  . Azithromycin     nausea  . Cephalosporins Other (See Comments)    unknown  . Clarithromycin Other (See Comments)    unknown  . Doxycycline Hyclate Other (See Comments)  . Penicillins Hives    ROS General: Denies fever, chills, night sweats, changes in weight, changes in appetite  + bring fog, pruritus HEENT: Denies headaches, ear pain, changes in vision, rhinorrhea, sore throat CV: Denies CP, palpitations, SOB, orthopnea Pulm: Denies SOB, cough, wheezing GI: Denies abdominal pain, nausea, vomiting, diarrhea, constipation GU: Denies dysuria, hematuria, frequency, vaginal discharge Msk: Denies muscle cramps, joint pains Neuro: Denies weakness, numbness, tingling Skin: Denies rashes, bruising Psych: Denies depression, anxiety, hallucinations     Objective:    Temperature 98.5 F (36.9 C), temperature source Oral, weight 158 lb (71.7 kg).  Gen. Pleasant, well-nourished, in no distress, normal affect   HEENT: [place]/AT, face  symmetric, no scleral icterus, PERRLA, nares patent without drainage Lungs: no accessory muscle use Cardiovascular: RRR, no peripheral edema Neuro:  A&Ox3, CN II-XII intact, normal gait Skin:  Warm, no lesions/ rash  Wt Readings from Last 3 Encounters:  07/14/18 158 lb (71.7 kg)  05/01/18 159 lb (72.1 kg)  01/30/18 161 lb (73 kg)    Lab Results  Component Value Date   WBC 7.9 12/07/2016   HGB 13.7 12/07/2016   HCT 40.7 12/07/2016   PLT 155 12/07/2016   GLUCOSE 107 (H) 12/17/2016   CHOL 189 10/28/2015   TRIG 64.0 10/28/2015   HDL 56.10 10/28/2015   LDLDIRECT 143.7 02/20/2010   LDLCALC 120 (H) 10/28/2015   ALT 23 04/08/2016   AST 16 04/08/2016   NA 138 12/17/2016   K 3.8 12/17/2016   CL 99 12/17/2016   CREATININE 0.86 12/17/2016   BUN 13 12/17/2016   CO2 30 12/17/2016   TSH 0.23 (L) 04/08/2016   INR 1.01 09/29/2009    Assessment/Plan:  Confusion  -likely from medication overuse. -pt advised to stop taking hydroxyzine every 8 hours to see if she notices a difference -f/u prn  Seasonal and environmental allergies -Continue montelukast and Ayr -ok to d/c nasal sprays given epistaxis -Continue follow-up with ENT prn   F/u prn  Grier Mitts, MD

## 2018-07-20 ENCOUNTER — Other Ambulatory Visit: Payer: Self-pay | Admitting: Internal Medicine

## 2018-07-26 ENCOUNTER — Other Ambulatory Visit: Payer: Self-pay | Admitting: Family Medicine

## 2018-07-26 ENCOUNTER — Emergency Department (HOSPITAL_COMMUNITY)
Admission: EM | Admit: 2018-07-26 | Discharge: 2018-07-26 | Disposition: A | Discharge status: Home / Self Care | Payer: Medicare Other | Attending: Emergency Medicine | Admitting: Emergency Medicine

## 2018-07-26 DIAGNOSIS — J329 Chronic sinusitis, unspecified: Secondary | ICD-10-CM | POA: Insufficient documentation

## 2018-07-26 DIAGNOSIS — Z79899 Other long term (current) drug therapy: Secondary | ICD-10-CM | POA: Insufficient documentation

## 2018-07-26 DIAGNOSIS — J3489 Other specified disorders of nose and nasal sinuses: Secondary | ICD-10-CM

## 2018-07-26 MED ORDER — BENAZEPRIL-HYDROCHLOROTHIAZIDE 20-12.5 MG PO TABS: 1.0000 | ORAL_TABLET | Freq: Every day | ORAL | 0 refills | Status: DC

## 2018-07-26 NOTE — ED Triage Notes (Signed)
Pt here for sinus pressure and congestion x 1 year. Dx with sinus infection last week and taking doxycycline for such.

## 2018-07-26 NOTE — Telephone Encounter (Signed)
Requested Prescriptions  Pending Prescriptions Disp Refills  . benazepril-hydrochlorthiazide (LOTENSIN HCT) 20-12.5 MG tablet 90 tablet 0    Sig: Take 1 tablet by mouth daily.     Cardiovascular:  ACEI + Diuretic Combos Failed - 07/26/2018  3:56 PM      Failed - Na in normal range and within 180 days    Sodium  Date Value Ref Range Status  12/17/2016 138 135 - 145 mEq/L Final         Failed - K in normal range and within 180 days    Potassium  Date Value Ref Range Status  12/17/2016 3.8 3.5 - 5.1 mEq/L Final         Failed - Cr in normal range and within 180 days    Creatinine, Ser  Date Value Ref Range Status  12/17/2016 0.86 0.40 - 1.20 mg/dL Final         Failed - Ca in normal range and within 180 days    Calcium  Date Value Ref Range Status  12/17/2016 9.7 8.4 - 10.5 mg/dL Final         Passed - Patient is not pregnant      Passed - Last BP in normal range    BP Readings from Last 1 Encounters:  07/26/18 137/68         Passed - Valid encounter within last 6 months    Recent Outpatient Visits          1 week ago Confusion   Therapist, music at [place], MD   2 months ago Essential hypertension   Therapist, music at [place], MD   5 months ago Essential hypertension   Therapist, [facility] at [facility], Doretha Sou, MD   7 months ago History of palpitations   Therapist, [facility] at [facility], Doretha Sou, MD   7 months ago Essential hypertension   Therapist, music at [employer], Doretha Sou, MD      Future Appointments            In 1 month Volanda Napoleon, Langley Adie, MD [employer] at [place], [facility]

## 2018-07-26 NOTE — Telephone Encounter (Signed)
Copied from [place] [id number]. Topic: Quick Communication - Rx Refill/Question >> Jul 26, 2018  3:49 PM Leward Quan A wrote: Medication: benazepril-hydrochlorthiazide (LOTENSIN HCT) 20-12.5 MG tablet   Per patient pharmacy said they requested since last week   Has the patient contacted their pharmacy? Yes.   (Agent: If no, request that the patient contact the pharmacy for the refill.) (Agent: If yes, when and what did the pharmacy advise?)  Preferred Pharmacy (with phone number or street name): [id number] - [place], [place] - [address] [phone] (Phone) [phone] (Fax)    Agent: Please be advised that RX refills may take up to 3 business days. We ask that you follow-up with your pharmacy.

## 2018-07-26 NOTE — ED Provider Notes (Signed)
[facility] EMERGENCY DEPARTMENT Provider Note   CSN: [id number] Arrival date & time: 07/26/18  1100     History   Chief Complaint No chief complaint on file. Sinus pressure  HPI Kristy Becker is a 68 y.o. female with a PMH of allergic rhinitis, HTN, and anxiety presenting with intermittent sinus pressure onset 1 year ago. Patient states symptoms have worsened over the last 2 weeks. Patient describes pain as pressure on the left forehead. Patient reports she saw allergist 2 weeks ago and was diagnosed with a sinus infection. Patient was prescribed Clarithromycin. Patient states she saw allergist 1 week ago again and prescribed Doxycyline. Patient reports symptoms have improved while on Doxycycline. Patient states rain makes her symptoms worse. Patient states she saw ENT 1 year ago and advised to have nasal surgery, but patient declined at that time. Patient states pain was worse this morning, but states symptoms have improved while in the ER without medications. Patient denies fever, headache, blurry vision, nausea, vomiting, abdominal pain, or dizziness.   HPI  Past Medical History:  Diagnosis Date  . ALLERGIC RHINITIS 10/06/2007  . ANXIETY 01/31/2007  . BACK PAIN 08/21/2007  . COLONIC POLYPS, HX OF 02/26/2009  . ECZEMA, HANDS 05/08/2007  . HYPERTENSION 01/31/2007    Patient Active Problem List   Diagnosis Date Noted  . AKI (acute kidney injury) ([place]) 12/07/2016  . Hyponatremia 12/07/2016  . Syncope due to orthostatic hypotension   . History of colonic polyps 02/26/2009  . Allergic rhinitis 10/06/2007  . Backache 08/21/2007  . ECZEMA, HANDS 05/08/2007  . Anxiety state 01/31/2007  . Essential hypertension 01/31/2007    Past Surgical History:  Procedure Laterality Date  . TONSILLECTOMY AND ADENOIDECTOMY       OB History   No obstetric history on file.      Home Medications    Prior to Admission medications   Medication Sig Start Date End Date  Taking? Authorizing Provider  amLODipine (NORVASC) 2.5 MG tablet Take 1 tablet (2.5 mg total) by mouth daily. 01/25/17   Marletta Lor, MD  benazepril-hydrochlorthiazide (LOTENSIN HCT) 20-12.5 MG tablet TAKE 1 TABLET BY MOUTH EVERY DAY 02/21/18   Marletta Lor, MD  Ca Phosphate-Cholecalciferol (CALTRATE GUMMY BITES PO) Take 1 tablet by mouth every morning.    [provider]  famotidine (PEPCID) 20 MG tablet Take 20 mg by mouth 2 (two) times daily.    [provider]  fexofenadine (ALLEGRA) 180 MG tablet Take 180 mg by mouth daily as needed. 07/11/15   [provider]  HYDROXYZINE HCL PO Take 25 mg by mouth as needed.    [provider]  MOMETASONE FUROATE NA Place 2 sprays into the nose daily. 50 mcg    [provider]  Multiple Vitamin (MULTIVITAMIN WITH MINERALS) TABS Take 1 tablet by mouth daily.    [provider]  Olopatadine HCl 0.6 % SOLN Place 2 puffs into the nose as needed.    [provider]  triamcinolone cream (KENALOG) 0.1 % Apply 1 application topically 2 (two) times daily. 11/09/17   Marletta Lor, MD    Family History No family history on file.  Social History Social History   Tobacco Use  . Smoking status: Never Smoker  . Smokeless tobacco: Never Used  Substance Use Topics  . Alcohol use: No  . Drug use: No     Allergies   Azithromycin; Cephalosporins; Clarithromycin; Doxycycline hyclate; and Penicillins   Review of  Systems Review of Systems  Constitutional: Positive for fatigue. Negative for activity change, appetite change and fever.  HENT: Positive for congestion. Negative for ear pain, postnasal drip, rhinorrhea and sore throat.   Eyes: Negative for pain, redness, itching and visual disturbance.  Respiratory: Negative for cough and shortness of breath.   Cardiovascular: Negative for chest pain.  Gastrointestinal: Negative for abdominal pain, diarrhea, nausea and vomiting.    Musculoskeletal: Negative for neck pain and neck stiffness.  Skin: Negative for rash.  Allergic/Immunologic: Positive for environmental allergies. Negative for immunocompromised state.  Neurological: Negative for dizziness, syncope, weakness and headaches.    Physical Exam Updated Vital Signs BP (!) 163/74 (BP Location: Right Arm)   Pulse 84   Temp (!) 97.5 F (36.4 C) (Oral)   Resp 16   Ht 5\' 2"  (1.575 m)   Wt 71.2 kg   SpO2 99%   BMI 28.72 kg/m   Physical Exam Vitals signs and nursing note reviewed.  Constitutional:      General: She is not in acute distress.    Appearance: She is well-developed. She is not diaphoretic.  HENT:     Head: Normocephalic and atraumatic.     Right Ear: Tympanic membrane, ear canal and external ear normal. No middle ear effusion.     Left Ear: Tympanic membrane, ear canal and external ear normal.  No middle ear effusion.     Nose: Mucosal edema and congestion present. No rhinorrhea.     Right Sinus: No maxillary sinus tenderness or frontal sinus tenderness.     Left Sinus: No maxillary sinus tenderness or frontal sinus tenderness.     Mouth/Throat:     Mouth: Mucous membranes are moist.     Pharynx: Uvula midline. No oropharyngeal exudate or posterior oropharyngeal erythema.  Eyes:     General:        Right eye: No discharge.        Left eye: No discharge.     Extraocular Movements: Extraocular movements intact.     Conjunctiva/sclera: Conjunctivae normal.     Pupils: Pupils are equal, round, and reactive to light.  Neck:     Musculoskeletal: Normal range of motion and neck supple.  Cardiovascular:     Rate and Rhythm: Normal rate and regular rhythm.     Heart sounds: Normal heart sounds. No murmur. No friction rub. No gallop.   Pulmonary:     Effort: Pulmonary effort is normal. No respiratory distress.     Breath sounds: Normal breath sounds. No wheezing or rales.  Abdominal:     Palpations: Abdomen is soft.     Tenderness: There is  no abdominal tenderness.  Musculoskeletal: Normal range of motion.  Lymphadenopathy:     Cervical: No cervical adenopathy.  Skin:    General: Skin is warm.     Findings: No rash.  Neurological:     Mental Status: She is alert and oriented to person, place, and time.   Mental Status:  Alert, oriented, thought content appropriate, able to give a coherent history. Speech fluent without evidence of aphasia. Able to follow 2 step commands without difficulty.  Cranial Nerves:  II:  Peripheral visual fields grossly normal, pupils equal, round, reactive to light III,IV, VI: ptosis not present, extra-ocular motions intact bilaterally  V,VII: smile symmetric, facial light touch sensation equal VIII: hearing grossly normal to voice  X: uvula elevates symmetrically  XI: bilateral shoulder shrug symmetric and strong XII: midline tongue extension without fassiculations Motor:  Normal tone. 5/5 in upper and lower extremities bilaterally including strong and equal grip strength and dorsiflexion/plantar flexion Sensory: Pinprick and light touch normal in all extremities.  Deep Tendon Reflexes: 2+ and symmetric in the biceps and patella Cerebellar: normal finger-to-nose with bilateral upper extremities Gait: normal gait and balance.  Negative pronator drift. Negative Romberg sign. CV: distal pulses palpable throughout   ED Treatments / Results  Labs (all labs ordered are listed, but only abnormal results are displayed) Labs Reviewed - No data to display  EKG None  Radiology No results found.  Procedures Procedures (including critical care time)  Medications Ordered in ED Medications - No data to display   Initial Impression / Assessment and Plan / ED Course  I have reviewed the triage vital signs and the nursing notes.  Pertinent labs & imaging results that were available during my care of the patient were reviewed by me and considered in my medical decision making (see chart for  details).    Suspect symptoms are likely due to sinus infection. Patient is afebrile and in no acute distress. Patient has been taking antibiotics and states symptoms have been improving. Will recommend patient follow up with allergist and ENT. Symptoms were controlled in the ER without any medications. Discussed elevated BP with patient and advised patient to follow up with PCP. Advised patient to continue antibiotic as prescribed. Discussed returning for new or worsening symptoms. Patient states she understands and agrees with plan.   Findings and plan of care discussed with supervising physician Dr. Tomi Bamberger.  Final Clinical Impressions(s) / ED Diagnoses   Final diagnoses:  Sinus pressure    ED Discharge Orders    None       Arville Lime, [place] 07/26/18 1231    Dorie Rank, MD 07/28/18 [phone]

## 2018-07-26 NOTE — Discharge Instructions (Addendum)
You have been seen today for sinus pressure. Please read and follow all provided instructions.   1. Medications: continue antibiotic as prescribed, usual home medications 2. Treatment: rest, drink plenty of fluids 3. Follow Up: Please follow up with your ENT (ears, nose, throat doctor) in 2 days, Please follow up with your primary doctor in 5-7 days for discussion of your diagnoses and further evaluation after today's visit; if you do not have a primary care doctor use the resource guide provided to find one; Please return to the ER for any new or worsening symptoms. Please obtain all of your results from medical records or have your doctors office obtain the results - share them with your doctor - you should be seen at your doctors office. Call today to arrange your follow up.   Take medications as prescribed. Please review all of the medicines and only take them if you do not have an allergy to them. Return to the emergency room for worsening condition or new concerning symptoms. Follow up with your regular doctor. If you don't have a regular doctor use one of the numbers below to establish a primary care doctor.  Please be aware that if you are taking birth control pills, taking other prescriptions, ESPECIALLY ANTIBIOTICS may make the birth control ineffective - if this is the case, either do not engage in sexual activity or use alternative methods of birth control such as condoms until you have finished the medicine and your family doctor says it is OK to restart them. If you are on a blood thinner such as COUMADIN, be aware that any other medicine that you take may cause the coumadin to either work too much, or not enough - you should have your coumadin level rechecked in next 7 days if this is the case.  ?  It is also a possibility that you have an allergic reaction to any of the medicines that you have been prescribed - Everybody reacts differently to medications and while MOST people have no  trouble with most medicines, you may have a reaction such as nausea, vomiting, rash, swelling, shortness of breath. If this is the case, please stop taking the medicine immediately and contact your physician.  ?  You should return to the ER if you develop severe or worsening symptoms.   Emergency Department Resource Guide 1) Find a Doctor and Pay Out of Pocket Although you won't have to find out who is covered by your insurance plan, it is a good idea to ask around and get recommendations. You will then need to call the office and see if the doctor you have chosen will accept you as a new patient and what types of options they offer for patients who are self-pay. Some doctors offer discounts or will set up payment plans for their patients who do not have insurance, but you will need to ask so you aren't surprised when you get to your appointment.  2) Contact Your Local Health Department Not all health departments have doctors that can see patients for sick visits, but many do, so it is worth a call to see if yours does. If you don't know where your local health department is, you can check in your phone book. The CDC also has a tool to help you locate your state's health department, and many state websites also have listings of all of their local health departments.  3) Find a [facility] If your illness is not likely to be very severe  or complicated, you may want to try a walk in clinic. These are popping up all over the country in pharmacies, drugstores, and shopping centers. They're usually staffed by nurse practitioners or physician assistants that have been trained to treat common illnesses and complaints. They're usually fairly quick and inexpensive. However, if you have serious medical issues or chronic medical problems, these are probably not your best option.  No Primary Care Doctor: Call Health Connect at  [phone] - they can help you locate a primary care doctor that  accepts your  insurance, provides certain services, etc. Physician Referral [phone]  Emergency Department Resource Guide 1) Find a Doctor and Pay Out of Pocket Although you won't have to find out who is covered by your insurance plan, it is a good idea to ask around and get recommendations. You will then need to call the office and see if the doctor you have chosen will accept you as a new patient and what types of options they offer for patients who are self-pay. Some doctors offer discounts or will set up payment plans for their patients who do not have insurance, but you will need to ask so you aren't surprised when you get to your appointment.  2) Contact Your Local Health Department Not all health departments have doctors that can see patients for sick visits, but many do, so it is worth a call to see if yours does. If you don't know where your local health department is, you can check in your phone book. The CDC also has a tool to help you locate your state's health department, and many state websites also have listings of all of their local health departments.  3) Find a [facility] If your illness is not likely to be very severe or complicated, you may want to try a walk in clinic. These are popping up all over the country in pharmacies, drugstores, and shopping centers. They're usually staffed by nurse practitioners or physician assistants that have been trained to treat common illnesses and complaints. They're usually fairly quick and inexpensive. However, if you have serious medical issues or chronic medical problems, these are probably not your best option.  No Primary Care Doctor: Call Health Connect at  [phone] - they can help you locate a primary care doctor that  accepts your insurance, provides certain services, etc. Physician Referral Service- [phone]  Chronic Pain Problems: Organization         Address  Phone   Notes  [facility]  [phone]  Patients need to be referred by their primary care doctor.   Medication Assistance: Organization         Address  Phone   Notes  [facility] Medication [facility] [address] [place], [place] [address] [phone] --Must be a resident of [facility] -- Must have NO insurance coverage whatsoever (no Medicaid/ Medicare, etc.) -- The pt. MUST have a primary care doctor that directs their care regularly and follows them in the community   [facility]  [phone]   [employer]  [phone]    Agencies that provide inexpensive medical care: Organization         Address  Phone   Notes  [place]  [phone]   [facility]    [phone]   [facility] [place], [place] [address] [phone]   [place]  [address], [place] [phone]   [facility]    [phone]   [facility]    [phone]   [place] and [place] [address], [place] Phone:  [phone], Fax:  [phone] Hours of Operation:  9 am - 6 pm, M-F.  Also accepts Medicaid/Medicare and self-pay.  [facility] for [place], [address], [place] Phone: [phone], Fax: [phone]. Hours of Operation:  8:30 am - 5:30 pm, M-F.  Also accepts Medicaid and self-pay.  [facility] [address] [place] Phone: [phone]   [place], [place], [place] [phone], Ext. [phone] Mondays & Thursdays: 7-9 AM.  First 15 patients are seen on a first come, first serve basis.    [place] Providers:  Organization         Address  Phone   Notes  [facility] [address], [place] [phone] Also accepts self-pay patients.  [facility] [address], [place]  [phone]   [place], [address], [place] [phone]   [facility] [address] [place] [phone]   Lucianne Lei [address], [place]   [phone] Only accepts [place] [facility] [place] patients after they have their name applied to their card.   Self-Pay (no insurance) in [facility]:  Organization         Address  Phone   Notes  Sickle Cell Patients, [facility] [place] [phone]   [facility] [place] [phone]   [facility] [place]  [place], [place], [place] [phone]   [facility]/Dr. Osei-Bonsu  [address], [place] or [address], [place] [phone] Phone number for both [place] and [place] locations is the same.  [facility]gent [facility] and [facility] [address] [place] [phone]   [facility] [address], [place] or [address] [phone]   [facility] [address] [place] [phone], phone; [phone], fax Sees patients 1st and 3rd Saturday of every month.  Must not qualify for public or private insurance (i.e. Medicaid, Medicare, [place] Health Choice, Veterans' Benefits)  Household income should be no more than 200% of the poverty level The clinic cannot treat you if you are pregnant or think you are pregnant  Sexually transmitted diseases are not treated at the clinic.

## 2018-08-31 ENCOUNTER — Ambulatory Visit: Payer: Medicare Other | Admitting: Family Medicine

## 2018-10-12 ENCOUNTER — Ambulatory Visit (INDEPENDENT_AMBULATORY_CARE_PROVIDER_SITE_OTHER): Payer: Medicare Other | Admitting: Family Medicine

## 2018-10-12 ENCOUNTER — Encounter: Payer: Self-pay | Admitting: Family Medicine

## 2018-10-12 ENCOUNTER — Other Ambulatory Visit: Payer: Self-pay

## 2018-10-12 DIAGNOSIS — R253 Fasciculation: Secondary | ICD-10-CM

## 2018-10-12 NOTE — Progress Notes (Signed)
Virtual Visit via Telephone Note  I connected with Kristy Becker on 10/12/18 at  2:00 PM EDT by telephone and verified that I am speaking with the correct person using two identifiers.   I discussed the limitations, risks, security and privacy concerns of performing an evaluation and management service by telephone and the availability of in person appointments. I also discussed with the patient that there may be a patient responsible charge related to this service. The patient expressed understanding and agreed to proceed.  Location patient: home Location provider: home office Participants present for the call: patient, provider Patient did not have a visit in the prior 7 days to address this/these issue(s).   History of Present Illness: Pt states she has been having a twitch/muscle spasm in face near her nose.  The sensation started on Sunday.  Seems like the feeling has improved some.  Pt states her sleep is good, but she has been under increased stress.  Pt's husband, who has Parkinson's, just had another stroke.  She has a caregiver that helps in the evening.  Pt states even if the caregiver is there, she is up doing various tasks around the house.  Pt denies changes in vision, rash, HAs, numbness in face or body, weakness in UE or LEs.   Observations/Objective: Patient sounds cheerful and well on the phone. I do not appreciate any SOB. Speech and thought processing are grossly intact. Patient reported vitals:  Assessment and Plan: Fasciculations -discussed possible causes -pt to increase po intake of water and vegetables -discussed drinking gatorade or powerade for electrolytes -discussed ways to decrease stress including exercising and taking a moment for herself -given precautions.   Follow Up Instructions: F/u prn  I did not refer this patient for an OV in the next 24 hours for this/these issue(s).  I discussed the assessment and treatment plan with the patient. The  patient was provided an opportunity to ask questions and all were answered. The patient agreed with the plan and demonstrated an understanding of the instructions.   The patient was advised to call back or seek an in-person evaluation if the symptoms worsen or if the condition fails to improve as anticipated.  I provided 13 minutes of non-face-to-face time during this encounter.   Kristy Ruddy, MD

## 2018-10-19 ENCOUNTER — Other Ambulatory Visit: Payer: Self-pay | Admitting: Family Medicine

## 2019-01-14 ENCOUNTER — Other Ambulatory Visit: Payer: Self-pay | Admitting: Family Medicine

## 2019-01-16 NOTE — Telephone Encounter (Signed)
Pt calling to f/up on this request.  °

## 2019-02-05 ENCOUNTER — Other Ambulatory Visit: Payer: Self-pay | Admitting: Otolaryngology

## 2019-03-06 ENCOUNTER — Ambulatory Visit (HOSPITAL_BASED_OUTPATIENT_CLINIC_OR_DEPARTMENT_OTHER): Admit: 2019-03-06 | Payer: Medicare Other | Admitting: Otolaryngology

## 2019-03-06 ENCOUNTER — Encounter (HOSPITAL_BASED_OUTPATIENT_CLINIC_OR_DEPARTMENT_OTHER): Payer: Self-pay

## 2019-03-06 SURGERY — REDUCTION, NASAL TURBINATE
Anesthesia: General | Laterality: Bilateral

## 2019-03-22 ENCOUNTER — Ambulatory Visit: Payer: Medicare Other | Admitting: Family Medicine

## 2019-03-22 DIAGNOSIS — Z0289 Encounter for other administrative examinations: Secondary | ICD-10-CM

## 2019-04-02 ENCOUNTER — Telehealth: Payer: Self-pay

## 2019-04-02 NOTE — Telephone Encounter (Signed)
Pt rescheduled her appointment for 04/26/2019    Copied from [place] [phone]. Topic: General - Other >> Mar 26, 2019  8:43 AM Ivar Drape wrote: Reason for CRM:  Patient missed her 03/22/2019 appt because her husband passed away.  The appt has been rescheduled.

## 2019-04-11 ENCOUNTER — Other Ambulatory Visit: Payer: Self-pay

## 2019-04-11 ENCOUNTER — Other Ambulatory Visit: Payer: Self-pay | Admitting: Family Medicine

## 2019-04-26 ENCOUNTER — Other Ambulatory Visit: Payer: Self-pay

## 2019-04-26 ENCOUNTER — Ambulatory Visit: Payer: Medicare Other | Admitting: Family Medicine

## 2019-04-26 ENCOUNTER — Encounter: Payer: Self-pay | Admitting: Family Medicine

## 2019-04-26 VITALS — BP 138/70 | HR 66 | Temp 97.2°F | Wt 163.6 lb

## 2019-04-26 DIAGNOSIS — R600 Localized edema: Secondary | ICD-10-CM | POA: Diagnosis not present

## 2019-04-26 DIAGNOSIS — Z23 Encounter for immunization: Secondary | ICD-10-CM | POA: Diagnosis not present

## 2019-04-26 DIAGNOSIS — J3089 Other allergic rhinitis: Secondary | ICD-10-CM | POA: Diagnosis not present

## 2019-04-26 LAB — BASIC METABOLIC PANEL
BUN: 16 mg/dL (ref 6–23)
CO2: 30 mEq/L (ref 19–32)
Calcium: 9.6 mg/dL (ref 8.4–10.5)
Chloride: 102 mEq/L (ref 96–112)
Creatinine, Ser: 0.76 mg/dL (ref 0.40–1.20)
GFR: 91.15 mL/min (ref >60.00)
Glucose, Bld: 91 mg/dL (ref 70–99)
Potassium: 3.9 mEq/L (ref 3.5–5.1)
Sodium: 139 mEq/L (ref 135–145)

## 2019-04-26 LAB — BRAIN NATRIURETIC PEPTIDE: Pro B Natriuretic peptide (BNP): 21 pg/mL (ref 0.0–100.0)

## 2019-04-26 NOTE — Patient Instructions (Signed)
Peripheral Edema  Peripheral edema is swelling that is caused by a buildup of fluid. Peripheral edema most often affects the lower legs, ankles, and feet. It can also develop in the arms, hands, and face. The area of the body that has peripheral edema will look swollen. It may also feel heavy or warm. Your clothes may start to feel tight. Pressing on the area may make a temporary dent in your skin. You may not be able to move your swollen arm or leg as much as usual. There are many causes of peripheral edema. It can happen because of a complication of other conditions such as congestive heart failure, kidney disease, or a problem with your blood circulation. It also can be a side effect of certain medicines or because of an infection. It often happens to women during pregnancy. Sometimes, the cause is not known. Follow these instructions at home: Managing pain, stiffness, and swelling   Raise (elevate) your legs while you are sitting or lying down.  Move around often to prevent stiffness and to lessen swelling.  Do not sit or stand for long periods of time.  Wear support stockings as told by your health care provider. Medicines  Take over-the-counter and prescription medicines only as told by your health care provider.  Your health care provider may prescribe medicine to help your body get rid of excess water (diuretic). General instructions  Pay attention to any changes in your symptoms.  Follow instructions from your health care provider about limiting salt (sodium) in your diet. Sometimes, eating less salt may reduce swelling.  Moisturize skin daily to help prevent skin from cracking and draining.  Keep all follow-up visits as told by your health care provider. This is important. Contact a health care provider if you have:  A fever.  Edema that starts suddenly or is getting worse, especially if you are pregnant or have a medical condition.  Swelling in only one leg.  Increased  swelling, redness, or pain in one or both of your legs.  Drainage or sores at the area where you have edema. Get help right away if you:  Develop shortness of breath, especially when you are lying down.  Have pain in your chest or abdomen.  Feel weak.  Feel faint. Summary  Peripheral edema is swelling that is caused by a buildup of fluid. Peripheral edema most often affects the lower legs, ankles, and feet.  Move around often to prevent stiffness and to lessen swelling. Do not sit or stand for long periods of time.  Pay attention to any changes in your symptoms.  Contact a health care provider if you have edema that starts suddenly or is getting worse, especially if you are pregnant or have a medical condition.  Get help right away if you develop shortness of breath, especially when lying down. This information is not intended to replace advice given to you by your health care provider. Make sure you discuss any questions you have with your health care provider. Document Released: 07/08/2004 Document Revised: 02/22/2018 Document Reviewed: 02/22/2018 Elsevier Patient Education  2020 [employer].  Edema  Edema is when you have too much fluid in your body or under your skin. Edema may make your legs, feet, and ankles swell up. Swelling is also common in looser tissues, like around your eyes. This is a common condition. It gets more common as you get older. There are many possible causes of edema. Eating too much salt (sodium) and being on your  feet or sitting for a long time can cause edema in your legs, feet, and ankles. Hot weather may make edema worse. Edema is usually painless. Your skin may look swollen or shiny. Follow these instructions at home:  Keep the swollen body part raised (elevated) above the level of your heart when you are sitting or lying down.  Do not sit still or stand for a long time.  Do not wear tight clothes. Do not wear garters on your upper legs.   Exercise your legs. This can help the swelling go down.  Wear elastic bandages or support stockings as told by your doctor.  Eat a low-salt (low-sodium) diet to reduce fluid as told by your doctor.  Depending on the cause of your swelling, you may need to limit how much fluid you drink (fluid restriction).  Take over-the-counter and prescription medicines only as told by your doctor. Contact a doctor if:  Treatment is not working.  You have heart, liver, or kidney disease and have symptoms of edema.  You have sudden and unexplained weight gain. Get help right away if:  You have shortness of breath or chest pain.  You cannot breathe when you lie down.  You have pain, redness, or warmth in the swollen areas.  You have heart, liver, or kidney disease and get edema all of a sudden.  You have a fever and your symptoms get worse all of a sudden. Summary  Edema is when you have too much fluid in your body or under your skin.  Edema may make your legs, feet, and ankles swell up. Swelling is also common in looser tissues, like around your eyes.  Raise (elevate) the swollen body part above the level of your heart when you are sitting or lying down.  Follow your doctor's instructions about diet and how much fluid you can drink (fluid restriction). This information is not intended to replace advice given to you by your health care provider. Make sure you discuss any questions you have with your health care provider. Document Released: 11/17/2007 Document Revised: 06/03/2017 Document Reviewed: 06/18/2016 Elsevier Patient Education  2020 [employer].

## 2019-04-26 NOTE — Progress Notes (Signed)
Subjective:    Patient ID: Kristy Becker, female    DOB: 29-Oct-1950, 68 y.o.   MRN: [id number]  Chief Complaint  Patient presents with  . Joint Swelling    HPI Patient was seen today for ongoing concern.  Pt notes LE edema mostly in ankles.  Pt has been doing a lot of standing.  Does not eat extra salt.  Recently bought no sodium salt from the store but has not used it yet.  Pt notes swelling is better when she drinks more water.  In the past wore TED hose, but they caused tingling in her feet at the end of the day.  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  Pt's husband died at the end of 03-11-2023 after getting pneumonia.  Pt states she is doing ok, taking it one day at a time.    Pt recently seen by ENT.  States was advised to have sinus surgery.  Pt is hesitant.  Pt now on Zyrtec, azelastine, and mometasone nasal spray.  Also given a 4 day prednisone taper, but is afraid to start after being told it could raise her bp,  Pt currently on benazepril-hctz 20-12.5 mg daily.  Past Medical History:  Diagnosis Date  . ALLERGIC RHINITIS 10/06/2007  . ANXIETY 01/31/2007  . BACK PAIN 08/21/2007  . COLONIC POLYPS, HX OF 02/26/2009  . ECZEMA, HANDS 05/08/2007  . HYPERTENSION 01/31/2007    Allergies  Allergen Reactions  . Azithromycin     nausea  . Cephalosporins Other (See Comments)    unknown  . Clarithromycin Other (See Comments)    unknown  . Doxycycline Hyclate Other (See Comments)  . Penicillins Hives    ROS General: Denies fever, chills, night sweats, changes in weight, changes in appetite HEENT: Denies headaches, ear pain, changes in vision, rhinorrhea, sore throat  +sinus issues CV: Denies CP, palpitations, SOB, orthopnea  +LE edema Pulm: Denies SOB, cough, wheezing GI: Denies abdominal pain, nausea, vomiting, diarrhea, constipation GU: Denies dysuria, hematuria, frequency, vaginal discharge Msk: Denies muscle cramps, joint pains Neuro: Denies weakness, numbness,  tingling Skin: Denies rashes, bruising Psych: Denies depression, anxiety, hallucinations     Objective:    Blood pressure 138/70, pulse 66, temperature (!) 97.2 F (36.2 C), temperature source Temporal, weight 163 lb 9.6 oz (74.2 kg), SpO2 98 %.   Gen. Pleasant, well-nourished, in no distress, normal affect   HEENT: [place]/AT, face symmetric, no scleral icterus, PERRLA, nares patent without drainage Neck: No JVD, no thyromegaly, no carotid bruits Lungs: no accessory muscle use, CTAB, no wheezes or rales Cardiovascular: RRR, no m/r/g,  1+ pitting edema of b/l ankles, trace edema of shins.   Neuro:  A&Ox3, CN II-XII intact, normal gait Skin:  Warm, no lesions/ rash  Wt Readings from Last 3 Encounters:  04/26/19 163 lb 9.6 oz (74.2 kg)  07/26/18 157 lb (71.2 kg)  07/14/18 158 lb (71.7 kg)    Lab Results  Component Value Date   WBC 7.9 12/07/2016   HGB 13.7 12/07/2016   HCT 40.7 12/07/2016   PLT 155 12/07/2016   GLUCOSE 107 (H) 12/17/2016   CHOL 189 10/28/2015   TRIG 64.0 10/28/2015   HDL 56.10 10/28/2015   LDLDIRECT 143.7 02/20/2010   LDLCALC 120 (H) 10/28/2015   ALT 23 04/08/2016   AST 16 04/08/2016   NA 138 12/17/2016   K 3.8 12/17/2016   CL 99 12/17/2016   CREATININE 0.86 12/17/2016   BUN 13 12/17/2016   CO2  30 12/17/2016   TSH 0.23 (L) 04/08/2016   INR 1.01 09/29/2009    Assessment/Plan:  Bilateral lower extremity edema  -discussed various causes including prolonged standing or sitting, increased sodium intake. -Patient advised to wear compression socks and elevate lower extremities when resting.  Continue to sodium diet. -Given handout -consider ECHO and f/u with Cards based on labs. - Plan: Brain Natriuretic Peptide, Basic Metabolic Panel  Need for influenza vaccination  - Plan: Flu Vaccine QUAD High Dose(Fluad)  Allergic rhinitis -Continue current nasal sprays as suggested by ENT.  Azelastine, mometasone, and Zyrtec -Chart reviewed for past history of  prednisone use.  Patient advised she has been prescribed prednisone in the past without apparent issue.  Patient given precautions -Continue follow-up with ENT  F/u prn in the next month.  Grier Mitts, MD

## 2019-05-25 ENCOUNTER — Other Ambulatory Visit: Payer: Self-pay | Admitting: Optometry

## 2019-05-25 DIAGNOSIS — H401133 Primary open-angle glaucoma, bilateral, severe stage: Secondary | ICD-10-CM

## 2019-05-25 DIAGNOSIS — H53459 Other localized visual field defect, unspecified eye: Secondary | ICD-10-CM

## 2019-06-18 DIAGNOSIS — H401233 Low-tension glaucoma, bilateral, severe stage: Secondary | ICD-10-CM | POA: Diagnosis not present

## 2019-06-18 DIAGNOSIS — H47213 Primary optic atrophy, bilateral: Secondary | ICD-10-CM | POA: Diagnosis not present

## 2019-06-18 DIAGNOSIS — H25813 Combined forms of age-related cataract, bilateral: Secondary | ICD-10-CM | POA: Diagnosis not present

## 2019-06-20 ENCOUNTER — Ambulatory Visit
Admission: RE | Admit: 2019-06-20 | Discharge: 2019-06-20 | Disposition: A | Discharge status: Home / Self Care | Payer: Medicare PPO | Source: Ambulatory Visit | Attending: Optometry | Admitting: Optometry

## 2019-06-20 DIAGNOSIS — H401133 Primary open-angle glaucoma, bilateral, severe stage: Secondary | ICD-10-CM

## 2019-06-20 DIAGNOSIS — H409 Unspecified glaucoma: Secondary | ICD-10-CM | POA: Diagnosis not present

## 2019-06-20 DIAGNOSIS — H53459 Other localized visual field defect, unspecified eye: Secondary | ICD-10-CM

## 2019-06-20 MED ORDER — GADOBENATE DIMEGLUMINE 529 MG/ML IV SOLN
14.0000 mL | Freq: Once | INTRAVENOUS | Status: AC | PRN
  Administered 2019-06-20: 14 mL via INTRAVENOUS

## 2019-06-28 DIAGNOSIS — J3089 Other allergic rhinitis: Secondary | ICD-10-CM | POA: Diagnosis not present

## 2019-06-28 DIAGNOSIS — J3081 Allergic rhinitis due to animal (cat) (dog) hair and dander: Secondary | ICD-10-CM | POA: Diagnosis not present

## 2019-06-28 DIAGNOSIS — J301 Allergic rhinitis due to pollen: Secondary | ICD-10-CM | POA: Diagnosis not present

## 2019-06-28 DIAGNOSIS — H1045 Other chronic allergic conjunctivitis: Secondary | ICD-10-CM | POA: Diagnosis not present

## 2019-07-10 DIAGNOSIS — H401133 Primary open-angle glaucoma, bilateral, severe stage: Secondary | ICD-10-CM | POA: Diagnosis not present

## 2019-07-10 DIAGNOSIS — H2513 Age-related nuclear cataract, bilateral: Secondary | ICD-10-CM | POA: Insufficient documentation

## 2019-07-11 DIAGNOSIS — H401190 Primary open-angle glaucoma, unspecified eye, stage unspecified: Secondary | ICD-10-CM | POA: Insufficient documentation

## 2019-07-16 ENCOUNTER — Other Ambulatory Visit: Payer: Self-pay | Admitting: Family Medicine

## 2019-07-23 DIAGNOSIS — H401233 Low-tension glaucoma, bilateral, severe stage: Secondary | ICD-10-CM | POA: Diagnosis not present

## 2019-07-23 DIAGNOSIS — H47213 Primary optic atrophy, bilateral: Secondary | ICD-10-CM | POA: Diagnosis not present

## 2019-07-23 DIAGNOSIS — H25813 Combined forms of age-related cataract, bilateral: Secondary | ICD-10-CM | POA: Diagnosis not present

## 2019-07-31 DIAGNOSIS — H47213 Primary optic atrophy, bilateral: Secondary | ICD-10-CM | POA: Diagnosis not present

## 2019-07-31 DIAGNOSIS — H11152 Pinguecula, left eye: Secondary | ICD-10-CM | POA: Diagnosis not present

## 2019-07-31 DIAGNOSIS — H25813 Combined forms of age-related cataract, bilateral: Secondary | ICD-10-CM | POA: Diagnosis not present

## 2019-07-31 DIAGNOSIS — H401233 Low-tension glaucoma, bilateral, severe stage: Secondary | ICD-10-CM | POA: Diagnosis not present

## 2019-08-17 DIAGNOSIS — H25813 Combined forms of age-related cataract, bilateral: Secondary | ICD-10-CM | POA: Diagnosis not present

## 2019-08-17 DIAGNOSIS — H401233 Low-tension glaucoma, bilateral, severe stage: Secondary | ICD-10-CM | POA: Diagnosis not present

## 2019-08-17 DIAGNOSIS — H11152 Pinguecula, left eye: Secondary | ICD-10-CM | POA: Diagnosis not present

## 2019-08-17 DIAGNOSIS — H47213 Primary optic atrophy, bilateral: Secondary | ICD-10-CM | POA: Diagnosis not present

## 2019-08-23 DIAGNOSIS — H401133 Primary open-angle glaucoma, bilateral, severe stage: Secondary | ICD-10-CM | POA: Diagnosis not present

## 2019-08-23 DIAGNOSIS — H2513 Age-related nuclear cataract, bilateral: Secondary | ICD-10-CM | POA: Diagnosis not present

## 2019-09-11 DIAGNOSIS — H25813 Combined forms of age-related cataract, bilateral: Secondary | ICD-10-CM | POA: Diagnosis not present

## 2019-09-11 DIAGNOSIS — H47213 Primary optic atrophy, bilateral: Secondary | ICD-10-CM | POA: Diagnosis not present

## 2019-09-11 DIAGNOSIS — H11152 Pinguecula, left eye: Secondary | ICD-10-CM | POA: Diagnosis not present

## 2019-09-11 DIAGNOSIS — H401233 Low-tension glaucoma, bilateral, severe stage: Secondary | ICD-10-CM | POA: Diagnosis not present

## 2019-09-21 DIAGNOSIS — H401233 Low-tension glaucoma, bilateral, severe stage: Secondary | ICD-10-CM | POA: Diagnosis not present

## 2019-09-21 DIAGNOSIS — H47213 Primary optic atrophy, bilateral: Secondary | ICD-10-CM | POA: Diagnosis not present

## 2019-09-21 DIAGNOSIS — H25813 Combined forms of age-related cataract, bilateral: Secondary | ICD-10-CM | POA: Diagnosis not present

## 2019-10-01 DIAGNOSIS — H47213 Primary optic atrophy, bilateral: Secondary | ICD-10-CM | POA: Diagnosis not present

## 2019-10-01 DIAGNOSIS — H401233 Low-tension glaucoma, bilateral, severe stage: Secondary | ICD-10-CM | POA: Diagnosis not present

## 2019-10-01 DIAGNOSIS — H25813 Combined forms of age-related cataract, bilateral: Secondary | ICD-10-CM | POA: Diagnosis not present

## 2019-10-01 DIAGNOSIS — H11152 Pinguecula, left eye: Secondary | ICD-10-CM | POA: Diagnosis not present

## 2019-10-08 ENCOUNTER — Other Ambulatory Visit: Payer: Self-pay | Admitting: Family Medicine

## 2019-10-19 DIAGNOSIS — H401133 Primary open-angle glaucoma, bilateral, severe stage: Secondary | ICD-10-CM | POA: Diagnosis not present

## 2019-10-19 DIAGNOSIS — H524 Presbyopia: Secondary | ICD-10-CM | POA: Diagnosis not present

## 2019-10-20 ENCOUNTER — Other Ambulatory Visit: Payer: Self-pay | Admitting: Family Medicine

## 2019-11-14 DIAGNOSIS — H25813 Combined forms of age-related cataract, bilateral: Secondary | ICD-10-CM | POA: Diagnosis not present

## 2019-11-14 DIAGNOSIS — H47213 Primary optic atrophy, bilateral: Secondary | ICD-10-CM | POA: Diagnosis not present

## 2019-11-14 DIAGNOSIS — H11152 Pinguecula, left eye: Secondary | ICD-10-CM | POA: Diagnosis not present

## 2019-11-14 DIAGNOSIS — H401233 Low-tension glaucoma, bilateral, severe stage: Secondary | ICD-10-CM | POA: Diagnosis not present

## 2019-11-16 ENCOUNTER — Other Ambulatory Visit: Payer: Self-pay | Admitting: Family Medicine

## 2019-11-28 ENCOUNTER — Other Ambulatory Visit: Payer: Self-pay

## 2019-11-29 ENCOUNTER — Encounter: Payer: Self-pay | Admitting: Family Medicine

## 2019-11-29 ENCOUNTER — Ambulatory Visit: Payer: Medicare PPO | Admitting: Family Medicine

## 2019-11-29 VITALS — BP 110/60 | HR 68 | Temp 97.7°F | Wt 157.0 lb

## 2019-11-29 DIAGNOSIS — H401133 Primary open-angle glaucoma, bilateral, severe stage: Secondary | ICD-10-CM

## 2019-11-29 DIAGNOSIS — M67441 Ganglion, right hand: Secondary | ICD-10-CM | POA: Diagnosis not present

## 2019-11-29 DIAGNOSIS — H2513 Age-related nuclear cataract, bilateral: Secondary | ICD-10-CM

## 2019-11-29 DIAGNOSIS — R600 Localized edema: Secondary | ICD-10-CM

## 2019-11-29 DIAGNOSIS — I499 Cardiac arrhythmia, unspecified: Secondary | ICD-10-CM | POA: Diagnosis not present

## 2019-11-29 NOTE — Progress Notes (Signed)
Subjective:    Patient ID: Kristy Becker, female    DOB: [birth year], 69 y.o.   MRN: [id number]  No chief complaint on file.   HPI Patient was seen today for ongoing concern.  Pt endorses bilateral lower extremity edema in ankles. Inquires about using "no salt" potassium chloride substitute and cooking. Pt still dealing with the death of her husband.  Tearful at times.  Pt notes recent dx of severe glaucoma and cataracts.  Followed by Dr. Katy Fitch locally and Dr. Arlis Porta at [facility].  Pt states her optometrist never mentioned glaucoma to her.  Pt has a bump on right hand near the thumb.  Present times a while.  Nonpainful.  On exam pt's heart noted to skip beats.  Patient endorses feeling similar sensation more since the death of her husband.  Pt denies chest pain, nausea, vomiting.  Pt also notes sensation of near syncope.  Trying to drink at least 4 bottles of water per day.  In the past told to add electrolytes to her water.  Past Medical History:  Diagnosis Date   ALLERGIC RHINITIS 10/06/2007   ANXIETY 01/31/2007   BACK PAIN 08/21/2007   COLONIC POLYPS, HX OF 02/26/2009   ECZEMA, HANDS 05/08/2007   HYPERTENSION 01/31/2007    Allergies  Allergen Reactions   Azithromycin     nausea   Cephalosporins Other (See Comments)    unknown   Clarithromycin Other (See Comments)    unknown   Doxycycline Hyclate Other (See Comments)   Penicillins Hives    ROS General: Denies fever, chills, night sweats, changes in weight, changes in appetite  + near syncope HEENT: Denies headaches, ear pain, changes in vision, rhinorrhea, sore throat  +decreased far vision, red eyes CV: Denies CP, palpitations, SOB, orthopnea  +LE edema, palpitations Pulm: Denies SOB, cough, wheezing GI: Denies abdominal pain, nausea, vomiting, diarrhea, constipation GU: Denies dysuria, hematuria, frequency, vaginal discharge Msk: Denies muscle cramps, joint pains Neuro: Denies weakness, numbness, tingling Skin: Denies  rashes, bruising  + bump on right hand Psych: Denies depression, anxiety, hallucinations     Objective:    Blood pressure 110/60, pulse 68, temperature 97.7 F (36.5 C), temperature source Temporal, weight 157 lb (71.2 kg), SpO2 98 %.  Gen. Pleasant, well-nourished, in no distress, normal affect   HEENT: [place]/AT, face symmetric, conjunctiva clear, no scleral icterus, PERRLA, EOMI, nares patent without drainage, pharynx without erythema or exudate. Neck: No JVD, no thyromegaly, no carotid bruits Lungs: no accessory muscle use, CTAB, no wheezes or rales Cardiovascular: irregular, skipping beats, no m/r/g, trace edema in b/l ankles. Musculoskeletal: No deformities, no cyanosis or clubbing, normal tone Neuro:  A&Ox3, CN II-XII intact, normal gait Skin:  Warm, no lesions/ rash.  Dorsum of right thumb at MCP joint with round mobile cystic lesion .5 cm.  Flatter cystic lesion on palmar surface of right thumb.   Wt Readings from Last 3 Encounters:  04/26/19 163 lb 9.6 oz (74.2 kg)  07/26/18 157 lb (71.2 kg)  07/14/18 158 lb (71.7 kg)    Lab Results  Component Value Date   WBC 7.9 12/07/2016   HGB 13.7 12/07/2016   HCT 40.7 12/07/2016   PLT 155 12/07/2016   GLUCOSE 91 04/26/2019   CHOL 189 10/28/2015   TRIG 64.0 10/28/2015   HDL 56.10 10/28/2015   LDLDIRECT 143.7 02/20/2010   LDLCALC 120 (H) 10/28/2015   ALT 23 04/08/2016   AST 16 04/08/2016   NA 139 04/26/2019   K 3.9 04/26/2019  CL 102 04/26/2019   CREATININE 0.76 04/26/2019   BUN 16 04/26/2019   CO2 30 04/26/2019   TSH 0.23 (L) 04/08/2016   INR 1.01 09/29/2009    Assessment/Plan:  Bilateral lower extremity edema  -Reviewed supportive care -Given handout - Plan: EKG 55-MZTA, Basic metabolic panel, ECHOCARDIOGRAM COMPLETE, Brain Natriuretic Peptide  Irregular heart beat  -Noted on exam -EKG obtained this visit with sinus bradycardia.  Slight ST elevation in V1 and V2   ST elevation in V2 similar to previous study from  01/27/2018. -Given precautions -Consider aspirin 81 mg daily - Plan: EKG 12-Lead, TSH, T4, free, ECHOCARDIOGRAM COMPLETE, Magnesium, Ambulatory referral to Cardiology, CANCELED: Ambulatory referral to Cardiology  Ganglion cyst of joint of finger of right hand -Discussed treatment options -Given handout -Patient wishes to monitor.  Primary open angle glaucoma (POAG) of both eyes, severe stage -Continue current eyedrops -Given handout -Continue follow-up with Dr. Katy Fitch and Dr. Arlis Porta, [facility]  Age-related nuclear cataract of both eyes -Continue follow-up with ophthalmology, Dr. Katy Fitch and Dr. Arlis Porta at [facility]  F/u as needed  Grier Mitts, MD

## 2019-11-29 NOTE — Patient Instructions (Addendum)
Ganglion Cyst  A ganglion cyst is a non-cancerous, fluid-filled lump that occurs near a joint or tendon. The cyst grows out of a joint or the lining of a tendon. Ganglion cysts most often develop in the hand or wrist, but they can also develop in the shoulder, elbow, hip, knee, ankle, or foot. Ganglion cysts are ball-shaped or egg-shaped. Their size can range from the size of a pea to larger than a grape. Increased activity may cause the cyst to get bigger because more fluid starts to build up. What are the causes? The exact cause of this condition is not known, but it may be related to:  Inflammation or irritation around the joint.  An injury.  Repetitive movements or overuse.  Arthritis. What increases the risk? You are more likely to develop this condition if:  You are a woman.  You are 40-59 years old. What are the signs or symptoms? The main symptom of this condition is a lump. It most often appears on the hand or wrist. In many cases, there are no other symptoms, but a cyst can sometimes cause:  Tingling.  Pain.  Numbness.  Muscle weakness.  Weak grip.  Less range of motion in a joint. How is this diagnosed? Ganglion cysts are usually diagnosed based on a physical exam. Your health care provider will feel the lump and may shine a light next to it. If it is a ganglion cyst, the light will likely shine through it. Your health care provider may order an X-ray, ultrasound, or MRI to rule out other conditions. How is this treated? Ganglion cysts often go away on their own without treatment. If you have pain or other symptoms, treatment may be needed. Treatment is also needed if the ganglion cyst limits your movement or if it gets infected. Treatment may include:  Wearing a brace or splint on your wrist or finger.  Taking anti-inflammatory medicine.  Having fluid drained from the lump with a needle (aspiration).  Getting a steroid injected into the joint.  Having  surgery to remove the ganglion cyst.  Placing a pad on your shoe or wearing shoes that will not rub against the cyst if it is on your foot. Follow these instructions at home:  Do not press on the ganglion cyst, poke it with a needle, or hit it.  Take over-the-counter and prescription medicines only as told by your health care provider.  If you have a brace or splint: ? Wear it as told by your health care provider. ? Remove it as told by your health care provider. Ask if you need to remove it when you take a shower or a bath.  Watch your ganglion cyst for any changes.  Keep all follow-up visits as told by your health care provider. This is important. Contact a health care provider if:  Your ganglion cyst becomes larger or more painful.  You have pus coming from the lump.  You have weakness or numbness in the affected area.  You have a fever or chills. Get help right away if:  You have a fever and have any of these in the cyst area: ? Increased redness. ? Red streaks. ? Swelling. Summary  A ganglion cyst is a non-cancerous, fluid-filled lump that occurs near a joint or tendon.  Ganglion cysts most often develop in the hand or wrist, but they can also develop in the shoulder, elbow, hip, knee, ankle, or foot.  Ganglion cysts often go away on their own without treatment.  This information is not intended to replace advice given to you by your health care provider. Make sure you discuss any questions you have with your health care provider. Document Revised: 05/13/2017 Document Reviewed: 01/28/2017 Elsevier Patient Education  2020 [place].  Glaucoma  Glaucoma is a condition that is caused by high pressure inside the eye. The pressure in the eye is raised because fluid (aqueous humor) within the eye cannot get out through the normal drainage system. The increased pressure can cause damage to the nerves of the eye, and that can result in loss of vision. It is important to  diagnose glaucoma early before damage occurs. Early treatment can often prevent vision loss. There are two main types of glaucoma:  Open-angle glaucoma. This is a long-term (chronic) condition that causes the pressure inside the eye to rise slowly. This is the most common type of glaucoma. In many cases, it does not cause symptoms until later in the disease.  Acute angle-closure glaucoma. With this type, the pressure inside the eye rises suddenly to a very high level. This causes severe pain and requires treatment right away. What are the causes? In many cases, the cause of this condition is not known. Glaucoma can sometimes result from other diseases, such as infection, cataracts, or tumors. What increases the risk? The following factors may make you more likely to develop this condition:  Being older than age 13.  Being of African-American descent.  Having high blood pressure or diabetes.  Having a family history of glaucoma.  Having a previous eye injury or eye surgery.  Being farsighted.  Using certain medicines that increase pressure in the eyes or cause the pupils to widen (dilate). What are the signs or symptoms? Open-angle glaucoma often causes no symptoms early on. If it is not treated, the condition will get worse and may cause a loss of side vision (peripheral vision). This can advance to tunnel vision, which means that you are able to see straight ahead but you have a loss of peripheral vision in all directions. Eventually, total loss of vision can occur. Symptoms of acute angle-closure glaucoma develop suddenly and may include:  Cloudy vision.  Severe pain in your affected eye.  Severe headache in the area around your eye.  Feeling nauseous.  Vomiting. How is this diagnosed? This condition is diagnosed through an eye exam by an eye specialist (ophthalmologist). This specialist will:  Perform a test to measure the pressure in your eye (tonometry).  Do eye tests  to check your vision, especially your peripheral vision.  Use various instruments to look inside your eye and check for damage to the nerves. Because glaucoma often does not cause symptoms until late in the disease, it is often found during a routine eye exam. Be sure to have your eyes checked regularly. How is this treated? Early treatment is important to prevent vision loss. Treatment will focus on lowering the pressure in your eyes. This usually involves using medicines in the form of eye drops. The type of medicine will depend on how bad the disease is and the degree of damage. Laser treatments or other types of surgery may be needed in some cases. Acute angle-closure glaucoma requires emergency treatment to help prevent vision loss. This treatment may involve eye drops and medicines that are given in pill form or through an IV. Surgery is often done after the pressure has been lowered with medicine. Follow these instructions at home:  Take over-the-counter and prescription medicines only as  told by your health care provider. ? If you were given eye drops, use them exactly as instructed. It is likely that you will need to use this medicine for the rest of your life.  Get regular exercise, but talk with your health care provider about which types of exercise are safe for you. You may need to avoid yoga or other types of exercise that may involve standing on your head.  Keep all follow-up visits as told by your health care provider. This is important. Contact a health care provider if:  You have new or worsening symptoms. Get help right away if:  You have severe pain in your affected eye.  You have problems with your vision.  You have a bad headache in the area around your eye.  You develop nausea and vomiting.  The same or similar symptoms develop in your other eye. Summary  Glaucoma is a condition that is caused by high pressure inside the eye. The increased pressure can cause  damage to the nerves of the eye, and that can result in loss of vision.  It is important to diagnose glaucoma early before damage occurs. Early treatment can often prevent vision loss.  Open-angle glaucoma often causes no symptoms early on. If it is not treated, the condition will get worse and may cause a loss of vision.  Because glaucoma often does not cause symptoms until late in the disease, it is often found during a routine eye exam. Be sure to have your eyes checked regularly. This information is not intended to replace advice given to you by your health care provider. Make sure you discuss any questions you have with your health care provider. Document Revised: 05/13/2017 Document Reviewed: 01/27/2017 Elsevier Patient Education  [place].  Peripheral Edema  Peripheral edema is swelling that is caused by a buildup of fluid. Peripheral edema most often affects the lower legs, ankles, and feet. It can also develop in the arms, hands, and face. The area of the body that has peripheral edema will look swollen. It may also feel heavy or warm. Your clothes may start to feel tight. Pressing on the area may make a temporary dent in your skin. You may not be able to move your swollen arm or leg as much as usual. There are many causes of peripheral edema. It can happen because of a complication of other conditions such as congestive heart failure, kidney disease, or a problem with your blood circulation. It also can be a side effect of certain medicines or because of an infection. It often happens to women during pregnancy. Sometimes, the cause is not known. Follow these instructions at home: Managing pain, stiffness, and swelling   Raise (elevate) your legs while you are sitting or lying down.  Move around often to prevent stiffness and to lessen swelling.  Do not sit or stand for long periods of time.  Wear support stockings as told by your health care provider. Medicines  Take  over-the-counter and prescription medicines only as told by your health care provider.  Your health care provider may prescribe medicine to help your body get rid of excess water (diuretic). General instructions  Pay attention to any changes in your symptoms.  Follow instructions from your health care provider about limiting salt (sodium) in your diet. Sometimes, eating less salt may reduce swelling.  Moisturize skin daily to help prevent skin from cracking and draining.  Keep all follow-up visits as told by your health care provider.  This is important. Contact a health care provider if you have:  A fever.  Edema that starts suddenly or is getting worse, especially if you are pregnant or have a medical condition.  Swelling in only one leg.  Increased swelling, redness, or pain in one or both of your legs.  Drainage or sores at the area where you have edema. Get help right away if you:  Develop shortness of breath, especially when you are lying down.  Have pain in your chest or abdomen.  Feel weak.  Feel faint. Summary  Peripheral edema is swelling that is caused by a buildup of fluid. Peripheral edema most often affects the lower legs, ankles, and feet.  Move around often to prevent stiffness and to lessen swelling. Do not sit or stand for long periods of time.  Pay attention to any changes in your symptoms.  Contact a health care provider if you have edema that starts suddenly or is getting worse, especially if you are pregnant or have a medical condition.  Get help right away if you develop shortness of breath, especially when lying down. This information is not intended to replace advice given to you by your health care provider. Make sure you discuss any questions you have with your health care provider. Document Revised: 02/22/2018 Document Reviewed: 02/22/2018 Elsevier Patient Education  [place].  Palpitations Palpitations are feelings that your  heartbeat is irregular or is faster than normal. It may feel like your heart is fluttering or skipping a beat. Palpitations are usually not a serious problem. They may be caused by many things, including smoking, caffeine, alcohol, stress, and certain medicines or drugs. Most causes of palpitations are not serious. However, some palpitations can be a sign of a serious problem. You may need further tests to rule out serious medical problems. Follow these instructions at home:     Pay attention to any changes in your condition. Take these actions to help manage your symptoms: Eating and drinking  Avoid foods and drinks that may cause palpitations. These may include: ? Caffeinated coffee, tea, soft drinks, diet pills, and energy drinks. ? Chocolate. ? Alcohol. Lifestyle  Take steps to reduce your stress and anxiety. Things that can help you relax include: ? Yoga. ? Mind-body activities, such as deep breathing, meditation, or using words and images to create positive thoughts (guided imagery). ? Physical activity, such as swimming, jogging, or walking. Tell your health care provider if your palpitations increase with activity. If you have chest pain or shortness of breath with activity, do not continue the activity until you are seen by your health care provider. ? Biofeedback. This is a method that helps you learn to use your mind to control things in your body, such as your heartbeat.  Do not use drugs, including cocaine or ecstasy. Do not use marijuana.  Get plenty of rest and sleep. Keep a regular bed time. General instructions  Take over-the-counter and prescription medicines only as told by your health care provider.  Do not use any products that contain nicotine or tobacco, such as cigarettes and e-cigarettes. If you need help quitting, ask your health care provider.  Keep all follow-up visits as told by your health care provider. This is important. These may include visits for  further testing if palpitations do not go away or get worse. Contact a health care provider if you:  Continue to have a fast or irregular heartbeat after 24 hours.  Notice that your palpitations occur  more often. Get help right away if you:  Have chest pain or shortness of breath.  Have a severe headache.  Feel dizzy or you faint. Summary  Palpitations are feelings that your heartbeat is irregular or is faster than normal. It may feel like your heart is fluttering or skipping a beat.  Palpitations may be caused by many things, including smoking, caffeine, alcohol, stress, certain medicines, and drugs.  Although most causes of palpitations are not serious, some causes can be a sign of a serious medical problem.  Get help right away if you faint or have chest pain, shortness of breath, a severe headache, or dizziness. This information is not intended to replace advice given to you by your health care provider. Make sure you discuss any questions you have with your health care provider. Document Revised: 07/13/2017 Document Reviewed: 07/13/2017 Elsevier Patient Education  2020 [employer].

## 2019-11-30 LAB — BASIC METABOLIC PANEL
BUN: 17 mg/dL (ref 6–23)
CO2: 28 mEq/L (ref 19–32)
Calcium: 9.8 mg/dL (ref 8.4–10.5)
Chloride: 102 mEq/L (ref 96–112)
Creatinine, Ser: 0.74 mg/dL (ref 0.40–1.20)
GFR: 93.84 mL/min (ref >60.00)
Glucose, Bld: 86 mg/dL (ref 70–99)
Potassium: 4.1 mEq/L (ref 3.5–5.1)
Sodium: 137 mEq/L (ref 135–145)

## 2019-11-30 LAB — T4, FREE: Free T4: 0.95 ng/dL (ref 0.60–1.60)

## 2019-11-30 LAB — BRAIN NATRIURETIC PEPTIDE: Pro B Natriuretic peptide (BNP): 25 pg/mL (ref 0.0–100.0)

## 2019-11-30 LAB — TSH: TSH: 0.38 u[IU]/mL (ref 0.35–4.50)

## 2019-11-30 LAB — MAGNESIUM: Magnesium: 2.1 mg/dL (ref 1.5–2.5)

## 2019-12-12 ENCOUNTER — Ambulatory Visit: Payer: Medicare PPO | Admitting: Family Medicine

## 2019-12-12 ENCOUNTER — Other Ambulatory Visit: Payer: Self-pay

## 2019-12-12 ENCOUNTER — Encounter: Payer: Self-pay | Admitting: Family Medicine

## 2019-12-12 VITALS — BP 118/60 | HR 63 | Temp 97.7°F | Wt 159.0 lb

## 2019-12-12 DIAGNOSIS — M21612 Bunion of left foot: Secondary | ICD-10-CM

## 2019-12-12 DIAGNOSIS — M67471 Ganglion, right ankle and foot: Secondary | ICD-10-CM | POA: Diagnosis not present

## 2019-12-12 DIAGNOSIS — J3089 Other allergic rhinitis: Secondary | ICD-10-CM

## 2019-12-12 DIAGNOSIS — M21611 Bunion of right foot: Secondary | ICD-10-CM | POA: Diagnosis not present

## 2019-12-12 DIAGNOSIS — R202 Paresthesia of skin: Secondary | ICD-10-CM

## 2019-12-12 LAB — VITAMIN D 25 HYDROXY (VIT D DEFICIENCY, FRACTURES): VITD: 33.34 ng/mL (ref 30.00–100.00)

## 2019-12-12 LAB — CBC
HCT: 39.6 % (ref 36.0–46.0)
Hemoglobin: 13.5 g/dL (ref 12.0–15.0)
MCHC: 34.1 g/dL (ref 30.0–36.0)
MCV: 90.7 fl (ref 78.0–100.0)
Platelets: 239 10*3/uL (ref 150.0–400.0)
RBC: 4.36 Mil/uL (ref 3.87–5.11)
RDW: 12.9 % (ref 11.5–15.5)
WBC: 6 10*3/uL (ref 4.0–10.5)

## 2019-12-12 LAB — FOLATE: Folate: >24.8 ng/mL (ref >5.9)

## 2019-12-12 LAB — VITAMIN B12: Vitamin B-12: 715 pg/mL (ref 211–911)

## 2019-12-12 LAB — T4, FREE: Free T4: 0.92 ng/dL (ref 0.60–1.60)

## 2019-12-12 LAB — TSH: TSH: 0.31 u[IU]/mL — ABNORMAL LOW (ref 0.35–4.50)

## 2019-12-12 NOTE — Progress Notes (Signed)
Subjective:    Patient ID: Kristy Becker, female    DOB: August 21, 1950, 69 y.o.   MRN: [id number]  No chief complaint on file.   HPI Patient was seen today for acute concern.  Pt notes intermittent numbness in b/l feet that has moved up b/l shins.  Occurs more in the evening.  Pt denies pain or tingling.  Denies numbness in UEs or wearing heels.  Pt wears tennis shoes or crocs when in the garden.  Pt taking a centrum MVI daily.  Recent labs BMP, BNP, TSH, free T4 on 6/17 were normal.  Pt has b/l bunions but states they do not cause pain.  Pt inquires about sinus procedure.  Currently taking azelastine nasal spray.  Followed by ENT, Dr. Fredderick Phenix.  Unable to take Flonase for other nasal spray with steroids as advised by her ophthalmologist, Dr. Katy Fitch.   Past Medical History:  Diagnosis Date  . ALLERGIC RHINITIS 10/06/2007  . ANXIETY 01/31/2007  . BACK PAIN 08/21/2007  . COLONIC POLYPS, HX OF 02/26/2009  . ECZEMA, HANDS 05/08/2007  . HYPERTENSION 01/31/2007    Allergies  Allergen Reactions  . Azithromycin     nausea  . Cephalosporins Other (See Comments)    unknown  . Clarithromycin Other (See Comments)    unknown  . Doxycycline Hyclate Other (See Comments)  . Penicillins Hives    ROS General: Denies fever, chills, night sweats, changes in weight, changes in appetite HEENT: Denies headaches, ear pain, changes in vision, rhinorrhea, sore throat  + sinus/allergy issues CV: Denies CP, palpitations, SOB, orthopnea Pulm: Denies SOB, cough, wheezing GI: Denies abdominal pain, nausea, vomiting, diarrhea, constipation GU: Denies dysuria, hematuria, frequency, vaginal discharge Msk: Denies muscle cramps, joint pains Neuro: Denies weakness, numbness + tingling in b/l feet and shins Skin: Denies rashes, bruising Psych: Denies depression, anxiety, hallucinations    Objective:    Blood pressure 118/60, pulse 63, temperature 97.7 F (36.5 C), temperature source Temporal, weight 159 lb (72.1  kg), SpO2 98 %.   Gen. Pleasant, well-nourished, in no distress, normal affect   HEENT: [place]/AT, face symmetric, no scleral icterus, PERRLA, EOMI, nares patent without drainage Lungs: no accessory muscle use, CTAB, no wheezes or rales Cardiovascular: RRR, no m/r/g, no peripheral edema Musculoskeletal: No deformities, no cyanosis or clubbing, normal tone Neuro:  A&Ox3, CN II-XII intact, normal gait Skin:  Warm, no lesions/ rash.  B/l bunions of feet.  R midfoot with round, firm, mobile mass. Similar mass on dorsum of R hand proximal to 1st digit.  Diabetic Foot Exam - Simple   Simple Foot Form Diabetic Foot exam was performed with the following findings: Yes 12/12/2019  9:06 PM  Visual Inspection Sensation Testing Intact to touch and monofilament testing bilaterally: Yes Pulse Check Posterior Tibialis and Dorsalis pulse intact bilaterally: Yes Comments Bilateral bunions noted.  No ulcerations or skin breakdown bilaterally.     Wt Readings from Last 3 Encounters:  12/12/19 159 lb (72.1 kg)  11/29/19 157 lb (71.2 kg)  04/26/19 163 lb 9.6 oz (74.2 kg)    Lab Results  Component Value Date   WBC 7.9 12/07/2016   HGB 13.7 12/07/2016   HCT 40.7 12/07/2016   PLT 155 12/07/2016   GLUCOSE 86 11/29/2019   CHOL 189 10/28/2015   TRIG 64.0 10/28/2015   HDL 56.10 10/28/2015   LDLDIRECT 143.7 02/20/2010   LDLCALC 120 (H) 10/28/2015   ALT 23 04/08/2016   AST 16 04/08/2016   NA 137 11/29/2019  K 4.1 11/29/2019   CL 102 11/29/2019   CREATININE 0.74 11/29/2019   BUN 17 11/29/2019   CO2 28 11/29/2019   TSH 0.38 11/29/2019   INR 1.01 09/29/2009    Assessment/Plan:  Paresthesia of both feet  -Discussed possible causes including neuropathy 2/2 nerve compression, vitamin deficiency, electrolyte deficiency - Plan: Vitamin B12, Folate, TSH, T4, Free, Ambulatory referral to Podiatry, CBC (no diff), Vitamin D, 25-hydroxy  Ganglion cyst of right foot  - Plan: Ambulatory referral to  Podiatry  Bunion of right foot  - Plan: Ambulatory referral to Podiatry  Bunion of left foot  - Plan: Ambulatory referral to Podiatry  Allergic rhinitis due to other allergic trigger, unspecified seasonality -Discussed possible sinus procedure -Discussed various medications options including nasal sprays and prescription Singulair -Patient encouraged to follow-up with ENT for clarification of various questions about procedure.  F/u as needed  Grier Mitts, MD

## 2019-12-12 NOTE — Patient Instructions (Addendum)
Paresthesia Paresthesia is an abnormal burning or prickling sensation. It is usually felt in the hands, arms, legs, or feet. However, it may occur in any part of the body. Usually, paresthesia is not painful. It may feel like:  Tingling or numbness.  Buzzing.  Itching. Paresthesia may occur without any clear cause, or it may be caused by:  Breathing too quickly (hyperventilation).  Pressure on a nerve.  An underlying medical condition.  Side effects of a medication.  Nutritional deficiencies.  Exposure to toxic chemicals. Most people experience temporary (transient) paresthesia at some time in their lives. For some people, it may be long-lasting (chronic) because of an underlying medical condition. If you have paresthesia that lasts a long time, you may need to be evaluated by your health care provider. Follow these instructions at home: Alcohol use   Do not drink alcohol if: ? Your health care provider tells you not to drink. ? You are pregnant, may be pregnant, or are planning to become pregnant.  If you drink alcohol: ? Limit how much you use to:  0-1 drink a day for women.  0-2 drinks a day for men. ? Be aware of how much alcohol is in your drink. In the [place] one drink equals one 12 oz bottle of beer (355 mL), one 5 oz glass of wine (148 mL), or one 1 oz glass of hard liquor (44 mL). Nutrition   Eat a healthy diet. This includes: ? Eating foods that are high in fiber, such as fresh fruits and vegetables, whole grains, and beans. ? Limiting foods that are high in fat and processed sugars, such as fried or sweet foods. General instructions  Take over-the-counter and prescription medicines only as told by your health care provider.  Do not use any products that contain nicotine or tobacco, such as cigarettes and e-cigarettes. These can keep blood from reaching damaged nerves. If you need help quitting, ask your health care provider.  If you have diabetes, work  closely with your health care provider to keep your blood sugar under control.  If you have numbness in your feet: ? Check every day for signs of injury or infection. Watch for redness, warmth, and swelling. ? Wear padded socks and comfortable shoes. These help protect your feet.  Keep all follow-up visits as told by your health care provider. This is important. Contact a health care provider if you:  Have paresthesia that gets worse or does not go away.  Have a burning or prickling feeling that gets worse when you walk.  Have pain, cramps, or dizziness.  Develop a rash. Get help right away if you:  Feel weak.  Have trouble walking or moving.  Have problems with speech, understanding, or vision.  Feel confused.  Cannot control your bladder or bowel movements.  Have numbness after an injury.  Develop new weakness in an arm or leg.  Faint. Summary  Paresthesia is an abnormal burning or prickling sensation that is usually felt in the hands, arms, legs, or feet. It may also occur in other parts of the body.  Paresthesia may occur without any clear cause, or it may be caused by breathing too quickly (hyperventilation), pressure on a nerve, an underlying medical condition, side effects of a medication, nutritional deficiencies, or exposure to toxic chemicals.  If you have paresthesia that lasts a long time, you may need to be evaluated by your health care provider. This information is not intended to replace advice given to you by   your health care provider. Make sure you discuss any questions you have with your health care provider. Document Revised: 06/26/2018 Document Reviewed: 06/09/2017 Elsevier Patient Education  2020 [place].  Ganglion Cyst  A ganglion cyst is a non-cancerous, fluid-filled lump that occurs near a joint or tendon. The cyst grows out of a joint or the lining of a tendon. Ganglion cysts most often develop in the hand or wrist, but they can also  develop in the shoulder, elbow, hip, knee, ankle, or foot. Ganglion cysts are ball-shaped or egg-shaped. Their size can range from the size of a pea to larger than a grape. Increased activity may cause the cyst to get bigger because more fluid starts to build up. What are the causes? The exact cause of this condition is not known, but it may be related to:  Inflammation or irritation around the joint.  An injury.  Repetitive movements or overuse.  Arthritis. What increases the risk? You are more likely to develop this condition if:  You are a woman.  You are 61-63 years old. What are the signs or symptoms? The main symptom of this condition is a lump. It most often appears on the hand or wrist. In many cases, there are no other symptoms, but a cyst can sometimes cause:  Tingling.  Pain.  Numbness.  Muscle weakness.  Weak grip.  Less range of motion in a joint. How is this diagnosed? Ganglion cysts are usually diagnosed based on a physical exam. Your health care provider will feel the lump and may shine a light next to it. If it is a ganglion cyst, the light will likely shine through it. Your health care provider may order an X-ray, ultrasound, or MRI to rule out other conditions. How is this treated? Ganglion cysts often go away on their own without treatment. If you have pain or other symptoms, treatment may be needed. Treatment is also needed if the ganglion cyst limits your movement or if it gets infected. Treatment may include:  Wearing a brace or splint on your wrist or finger.  Taking anti-inflammatory medicine.  Having fluid drained from the lump with a needle (aspiration).  Getting a steroid injected into the joint.  Having surgery to remove the ganglion cyst.  Placing a pad on your shoe or wearing shoes that will not rub against the cyst if it is on your foot. Follow these instructions at home:  Do not press on the ganglion cyst, poke it with a needle, or  hit it.  Take over-the-counter and prescription medicines only as told by your health care provider.  If you have a brace or splint: ? Wear it as told by your health care provider. ? Remove it as told by your health care provider. Ask if you need to remove it when you take a shower or a bath.  Watch your ganglion cyst for any changes.  Keep all follow-up visits as told by your health care provider. This is important. Contact a health care provider if:  Your ganglion cyst becomes larger or more painful.  You have pus coming from the lump.  You have weakness or numbness in the affected area.  You have a fever or chills. Get help right away if:  You have a fever and have any of these in the cyst area: ? Increased redness. ? Red streaks. ? Swelling. Summary  A ganglion cyst is a non-cancerous, fluid-filled lump that occurs near a joint or tendon.  Ganglion cysts most  often develop in the hand or wrist, but they can also develop in the shoulder, elbow, hip, knee, ankle, or foot.  Ganglion cysts often go away on their own without treatment. This information is not intended to replace advice given to you by your health care provider. Make sure you discuss any questions you have with your health care provider. Document Revised: 05/13/2017 Document Reviewed: 01/28/2017 Elsevier Patient Education  [place].  Sinus Endoscopy  Sinus endoscopy is a procedure that may be used to check for and treat problems with the sinuses. The sinuses are air-filled spaces in the skull that are behind the bones of the face and forehead. They open into the nasal cavity. For this procedure, a thin, lighted device (endoscope) is passed through the nose and into the sinuses. The endoscope has a small camera that sends images to a screen in the room. It allows your health care provider to look into the nose and sinuses and check for the cause of any problems you are having. During the endoscopy, your  health care provider may also do procedures to treat any problems found. This might include widening the passageway between your sinuses and your nose to improve drainage, or removing abnormalities such as polyps. Tell a health care provider about:  Any allergies you have.  All medicines you are taking, including vitamins, herbs, eye drops, creams, and over-the-counter medicines.  Any problems you or family members have had with anesthetic medicines.  Any blood disorders you have.  Any surgeries you have had.  Any medical conditions you have.  Whether you are pregnant or may be pregnant. What are the risks? Generally, this is a safe procedure. However, problems may occur, including:  Bleeding.  Infection.  Allergic reactions to medicines.  Damage to the nasal passage or tissue lining the sinuses.  Fainting. What happens before the procedure? Medicines  You may be asked to use one or more types of nose spray before the procedure to prepare your nasal passages. Use these as told by your health care provider. This is important.  Ask your health care provider about: ? Changing or stopping your regular medicines. This is especially important if you are taking diabetes medicines or blood thinners. ? Taking medicines such as aspirin and ibuprofen. These medicines can thin your blood. Do not take these medicines unless your health care provider tells you to take them. ? Taking over-the-counter medicines, vitamins, herbs, and supplements. Eating and drinking restrictions Follow instructions from your health care provider about eating and drinking, which may include:  8 hours before the procedure - stop eating heavy meals or foods such as meat, fried foods, or fatty foods.  6 hours before the procedure - stop eating light meals or foods, such as toast or cereal.  6 hours before the procedure - stop drinking milk or drinks that contain milk.  2 hours before the procedure - stop  drinking clear liquids. General instructions  If you will be given a sedative or a general anesthetic during the procedure, you should plan to have someone take you home from the hospital or clinic.  You may have a CT scan of your sinuses. What happens during the procedure?   You may have an IV inserted into one of your veins.  You may be given one or more of the following: ? A medicine to help you relax (sedative). ? A medicine to numb the area (local anesthetic). ? A medicine to make you fall asleep (general  anesthetic).  The endoscope will be inserted into one nostril at a time. It will be passed through your nose to view your sinuses.  Pictures may be taken of any abnormalities that are found in your sinuses.  Treatments of these abnormalities may also be performed through the endoscope, such as removing polyps or widening the opening of the sinuses into the nose. The procedure may vary among health care providers and hospitals. What happens after the procedure?  Your blood pressure, heart rate, breathing rate, and blood oxygen level will be monitored until you leave the hospital or clinic.  You may have to wait to eat or drink until the medicines you were given have worn off.  Do not drive for 24 hours if you were given a sedative. Summary  Sinus endoscopy is a procedure that may be used to check for and treat problems with the sinuses.  For this procedure, a thin, lighted device (endoscope) is passed through the nose and into the sinuses.  During the endoscopy, your health care provider may also do procedures to treat any problems found. This information is not intended to replace advice given to you by your health care provider. Make sure you discuss any questions you have with your health care provider. Document Revised: 10/18/2017 Document Reviewed: 05/18/2017 Elsevier Patient Education  [place].

## 2019-12-21 ENCOUNTER — Other Ambulatory Visit (HOSPITAL_COMMUNITY): Payer: Medicare PPO

## 2019-12-25 ENCOUNTER — Encounter: Payer: Self-pay | Admitting: Family Medicine

## 2019-12-26 ENCOUNTER — Ambulatory Visit (HOSPITAL_COMMUNITY): Payer: Medicare PPO | Attending: Cardiology

## 2019-12-26 ENCOUNTER — Other Ambulatory Visit: Payer: Self-pay

## 2019-12-26 DIAGNOSIS — I499 Cardiac arrhythmia, unspecified: Secondary | ICD-10-CM | POA: Insufficient documentation

## 2019-12-26 DIAGNOSIS — R600 Localized edema: Secondary | ICD-10-CM | POA: Diagnosis not present

## 2019-12-27 ENCOUNTER — Ambulatory Visit: Payer: Self-pay | Admitting: Podiatry

## 2019-12-31 ENCOUNTER — Telehealth: Payer: Self-pay

## 2019-12-31 DIAGNOSIS — H25813 Combined forms of age-related cataract, bilateral: Secondary | ICD-10-CM | POA: Diagnosis not present

## 2019-12-31 DIAGNOSIS — H11152 Pinguecula, left eye: Secondary | ICD-10-CM | POA: Diagnosis not present

## 2019-12-31 DIAGNOSIS — H401233 Low-tension glaucoma, bilateral, severe stage: Secondary | ICD-10-CM | POA: Diagnosis not present

## 2019-12-31 DIAGNOSIS — H47213 Primary optic atrophy, bilateral: Secondary | ICD-10-CM | POA: Diagnosis not present

## 2019-12-31 NOTE — Telephone Encounter (Signed)
Pt called the office regarding her lab results, reviewed lab results with pt verbalized understanding

## 2020-01-01 ENCOUNTER — Ambulatory Visit: Payer: Self-pay | Admitting: Podiatry

## 2020-01-01 ENCOUNTER — Telehealth: Payer: Self-pay | Admitting: General Practice

## 2020-01-01 NOTE — Telephone Encounter (Signed)
     Pt is calling echo result. Advise no result yet and nurse will call back once doctor review it. Pt understood

## 2020-01-02 ENCOUNTER — Telehealth: Payer: Self-pay | Admitting: Radiology

## 2020-01-02 ENCOUNTER — Other Ambulatory Visit: Payer: Self-pay

## 2020-01-02 ENCOUNTER — Ambulatory Visit (INDEPENDENT_AMBULATORY_CARE_PROVIDER_SITE_OTHER): Payer: Medicare PPO | Admitting: Adult Health

## 2020-01-02 ENCOUNTER — Encounter: Payer: Self-pay | Admitting: Adult Health

## 2020-01-02 VITALS — BP 122/62 | HR 58 | Ht 62.0 in | Wt 159.0 lb

## 2020-01-02 DIAGNOSIS — I499 Cardiac arrhythmia, unspecified: Secondary | ICD-10-CM

## 2020-01-02 DIAGNOSIS — F419 Anxiety disorder, unspecified: Secondary | ICD-10-CM

## 2020-01-02 DIAGNOSIS — R002 Palpitations: Secondary | ICD-10-CM

## 2020-01-02 DIAGNOSIS — I10 Essential (primary) hypertension: Secondary | ICD-10-CM

## 2020-01-02 NOTE — Patient Instructions (Signed)
Medication Instructions:  Continue current medications  *If you need a refill on your cardiac medications before your next appointment, please call your pharmacy*   Lab Work: None Ordered   Testing/Procedures: Your physician has recommended that you wear a Zio monitor. Holter monitors are medical devices that record the heart's electrical activity. Doctors most often use these monitors to diagnose arrhythmias. Arrhythmias are problems with the speed or rhythm of the heartbeat. The monitor is a small, portable device. You can wear one while you do your normal daily activities. This is usually used to diagnose what is causing palpitations/syncope (passing out).   Follow-Up: At [facility], you and your health needs are our priority.  As part of our continuing mission to provide you with exceptional heart care, we have created designated Provider Care Teams.  These Care Teams include your primary Cardiologist (physician) and Advanced Practice Providers (APPs -  Physician Assistants and Nurse Practitioners) who all work together to provide you with the care you need, when you need it.  We recommend signing up for the patient portal called "MyChart".  Sign up information is provided on this After Visit Summary.  MyChart is used to connect with patients for Virtual Visits (Telemedicine).  Patients are able to view lab/test results, encounter notes, upcoming appointments, etc.  Non-urgent messages can be sent to your provider as well.   To learn more about what you can do with MyChart, go to [email or web address].    Your next appointment:   6 month(s)  The format for your next appointment:   In Person  Provider:   You may see Buford Dresser, MD or one of the following Advanced Practice Providers on your designated Care Team:    Rosaria Ferries, PA-C  Jory Sims, DNP, ANP  Cadence Kathlen Mody, PA-C    Other Instructions [place] Monitor Instructions   Your  physician has requested you wear your ZIO patch monitor 7 days.   This is a single patch monitor.  [employer] supplies one patch monitor per enrollment.  Additional stickers are not available.   Please do not apply patch if you will be having a Nuclear Stress Test, Echocardiogram, Cardiac CT, MRI, or Chest Xray during the time frame you would be wearing the monitor. The patch cannot be worn during these tests.  You cannot remove and re-apply the ZIO XT patch monitor.   Your ZIO patch monitor will be sent USPS Priority mail from [facility] directly to your home address. The monitor may also be mailed to a PO BOX if home delivery is not available.   It may take 3-5 days to receive your monitor after you have been enrolled.   Once you have received you monitor, please review enclosed instructions.  Your monitor has already been registered assigning a specific monitor serial # to you.   Applying the monitor   Shave hair from upper left chest.   Hold abrader disc by orange tab.  Rub abrader in 40 strokes over left upper chest as indicated in your monitor instructions.   Clean area with 4 enclosed alcohol pads .  Use all pads to assure are is cleaned thoroughly.  Let dry.   Apply patch as indicated in monitor instructions.  Patch will be place under collarbone on left side of chest with arrow pointing upward.   Rub patch adhesive wings for 2 minutes.Remove white label marked "1".  Remove white label marked "2".  Rub patch adhesive wings for 2 additional minutes.  While looking in a mirror, press and release button in center of patch.  A small green light will flash 3-4 times .  This will be your only indicator the monitor has been turned on.     Do not shower for the first 24 hours.  You may shower after the first 24 hours.   Press button if you feel a symptom. You will hear a small click.  Record Date, Time and Symptom in the Patient Log Book.   When you are ready to remove patch,  follow instructions on last 2 pages of Patient Log Book.  Stick patch monitor onto last page of Patient Log Book.   Place Patient Log Book in [place] box.  Use locking tab on box and tape box closed securely.  The [employer] has [employer] on it.  Please place in mailbox as soon as possible.  Your physician should have your test results approximately 7 days after the monitor has been mailed back to [facility].   Call [place] at [phone] if you have questions regarding your ZIO XT patch monitor.  Call them immediately if you see an orange light blinking on your monitor.   If your monitor falls off in less than 4 days contact our Monitor department at [phone].  If your monitor becomes loose or falls off after 4 days call [employer] at [phone] for suggestions on securing your monitor.  \

## 2020-01-02 NOTE — Progress Notes (Signed)
Cardiology Office Note   Date:  01/02/2020   ID:  Kristy Becker, DOB 1950-12-05, MRN [id number]  PCP:  Billie Ruddy, MD  Cardiologist:  Dr.Christopher  No chief complaint on file.    History of Present Illness: Kristy Becker is a 69 y.o. female who presents for ongoing assessment and management of hypertension, palpitations, with history of anxiety and depression with anxiety attacks. She is also caring for her husband with Parkinson's and CVA.   Dr.Christopher felt her palpitations were related to anxiety.However, a cardiac monitor was place, and echocardiogram was ordered.  Her BP was elevated. She was also to have lipid panel in near future if not done by PCP.   Cardiac monitor 02/06/2018 did not reveal any significant rhythm with patient triggered events showing NSR. Echocardiogram repeated on 12/26/2019 demonstrated EF of 60%-65%. Grade I diastolic dysfunction.  No valvular abnormalities.  She comes today tearful and anxious.  Her husband died in 19-Mar-2019 and she continues to grieve his passing.  She also has been diagnosed with glaucoma and she is very anxious about this.  She followed up with her PCP who noticed extrasystole during her assessment.  She was referred back to cardiology for further evaluation with echocardiogram ordered as discussed above.  She states that she does not feel the extra beats very often.  However when Dr. Volanda Napoleon noticed them the patient became worried.  She admits to a lot of anxiety and panic attacks.    Past Medical History:  Diagnosis Date  . ALLERGIC RHINITIS 10/06/2007  . ANXIETY 01/31/2007  . BACK PAIN 08/21/2007  . COLONIC POLYPS, HX OF 02/26/2009  . ECZEMA, HANDS 05/08/2007  . HYPERTENSION 01/31/2007    Past Surgical History:  Procedure Laterality Date  . TONSILLECTOMY AND ADENOIDECTOMY       Current Outpatient Medications  Medication Sig Dispense Refill  . benazepril-hydrochlorthiazide (LOTENSIN HCT) 20-12.5 MG tablet TAKE 1  TABLET BY MOUTH EVERY DAY 90 tablet 0  . Ca Phosphate-Cholecalciferol (CALTRATE GUMMY BITES PO) Take 1 tablet by mouth every morning.    . cetirizine (ZYRTEC) 10 MG tablet Take 10 mg by mouth daily.    . dorzolamidel-timolol (COSOPT) 22.3-6.8 MG/ML SOLN ophthalmic solution 1 drop 2 (two) times daily.    . famotidine (PEPCID) 20 MG tablet Take 20 mg by mouth 2 (two) times daily. OTC    . HYDROXYZINE HCL PO Take 25 mg by mouth as needed.    . Latanoprostene Bunod (VYZULTA) 0.024 % SOLN Apply to eye.    . MOMETASONE FUROATE NA Place 2 sprays into the nose daily. 50 mcg    . Multiple Vitamin (MULTIVITAMIN WITH MINERALS) TABS Take 1 tablet by mouth daily.    . Multiple Vitamins-Minerals (CENTRUM SILVER 50+WOMEN) TABS Take by mouth daily. OTC    . Olopatadine HCl 0.6 % SOLN Place 2 puffs into the nose as needed.     No current facility-administered medications for this visit.    Allergies:   Azithromycin, Cephalosporins, Clarithromycin, Doxycycline hyclate, and Penicillins    Social History:  The patient  reports that she has never smoked. She has never used smokeless tobacco. She reports that she does not drink alcohol and does not use drugs.   Family History:  The patient's family history is not on file.    ROS: All other systems are reviewed and negative. Unless otherwise mentioned in H&P    PHYSICAL EXAM: VS:  BP 122/62   Pulse (!) 58  Ht 5\' 2"  (1.575 m)   Wt 159 lb (72.1 kg)   SpO2 98%   BMI 29.08 kg/m  , BMI Body mass index is 29.08 kg/m. GEN: Well nourished, well developed, in no acute distress HEENT: normal Neck: no JVD, carotid bruits, or masses Cardiac: RRR; no murmurs, rubs, or gallops,no edema  Respiratory:  Clear to auscultation bilaterally, normal work of breathing GI: soft, nontender, nondistended, + BS MS: no deformity or atrophy Skin: warm and dry, no rash Neuro:  Strength and sensation are intact Psych: Tearful and anxious   EKG: Sinus bradycardia heart  rate of 58 bpm otherwise normal (personally reviewed)  Recent Labs: 11/29/2019: BUN 17; Creatinine, Ser 0.74; Magnesium 2.1; Potassium 4.1; Pro B Natriuretic peptide (BNP) 25.0; Sodium 137 12/12/2019: Hemoglobin 13.5; Platelets 239.0; TSH 0.31    Lipid Panel    Component Value Date/Time   CHOL 189 10/28/2015 0945   TRIG 64.0 10/28/2015 0945   HDL 56.10 10/28/2015 0945   CHOLHDL 3 10/28/2015 0945   VLDL 12.8 10/28/2015 0945   LDLCALC 120 (H) 10/28/2015 0945   LDLDIRECT 143.7 02/20/2010 0813      Wt Readings from Last 3 Encounters:  01/02/20 159 lb (72.1 kg)  12/12/19 159 lb (72.1 kg)  11/29/19 157 lb (71.2 kg)      Other studies Reviewed: Echocardiogram 01-17-2020  1. Left ventricular ejection fraction, by estimation, is 60 to 65%. The  left ventricle has normal function. The left ventricle has no regional  wall motion abnormalities. Left ventricular diastolic parameters are  consistent with Grade I diastolic  dysfunction (impaired relaxation). The average left ventricular global  longitudinal strain is -19.5 %. The global longitudinal strain is normal.  2. Right ventricular systolic function is normal. The right ventricular  size is normal. There is normal pulmonary artery systolic pressure.  3. The mitral valve is normal in structure. Trivial mitral valve  regurgitation. No evidence of mitral stenosis.  4. The aortic valve is normal in structure. Aortic valve regurgitation is  not visualized. No aortic stenosis is present.  5. The inferior vena cava is normal in size with greater than 50%  respiratory variability, suggesting right atrial pressure of 3 mmHg.   ASSESSMENT AND PLAN:  1.  Palpitations: Extrasystole noted by PCP.  The patient did have a cardiac monitor in 2019 that did not reveal any significant arrhythmias.  Dr. Harrell Gave felt that some of her symptoms were related to anxiety.  The patient is concerned about palpitations again especially because her  primary care physician also heard them on her assessment.  Echocardiogram was essentially normal with grade 1 diastolic dysfunction.  The patient would like to have a repeat of her cardiac monitor.  I did explain to her that under her stress and anxiety she may have palpitations but she would like to know for sure that there is nothing more going on.  I have ordered a 7-day ZIO cardiac monitor which I explained will come to her home with instructions and that she would be sending it back.  The patient verbalizes understanding.  2.  Hypertension: Despite her anxiety patient's blood pressure is well controlled today.  We will not make any changes in her regimen.  Recent labs per primary care revealed a potassium of 4.1, creatinine of 0.74.  3.  Anxiety: The patient's family would like her to talk to her PCP about referral to counseling.  I have agreed that this would be a good support for her to help  her through her anxiety and the grief of losing her husband.  Will defer to primary care for these referrals as she plan to call her anyway.   Current medicines are reviewed at length with the patient today.  I have spent 20 minutes dedicated to the care of this patient on the date of this encounter to include pre-visit review of records, assessment, management and diagnostic testing,with shared decision making.  Labs/ tests ordered today include: 7 day cardiac monitor. Phill Myron. [place] Pugh, ANP, AACC   01/02/2020 12:19 PM    [facility] [place] [address] Office [phone] Fax [phone]  Notice: This dictation was prepared with Dragon dictation along with smaller phrase technology. Any transcriptional errors that result from this process are unintentional and may not be corrected upon review.

## 2020-01-02 NOTE — Telephone Encounter (Signed)
Enrolled patient for a 7 day Zio AT monitor to be mailed to patients home. Called to confirm mailing address [address] [place] [address]

## 2020-01-07 ENCOUNTER — Ambulatory Visit (INDEPENDENT_AMBULATORY_CARE_PROVIDER_SITE_OTHER): Payer: Medicare PPO

## 2020-01-07 DIAGNOSIS — I499 Cardiac arrhythmia, unspecified: Secondary | ICD-10-CM | POA: Diagnosis not present

## 2020-01-08 ENCOUNTER — Ambulatory Visit (INDEPENDENT_AMBULATORY_CARE_PROVIDER_SITE_OTHER): Payer: Medicare PPO

## 2020-01-08 ENCOUNTER — Ambulatory Visit: Payer: Medicare PPO | Admitting: Podiatry

## 2020-01-08 ENCOUNTER — Encounter: Payer: Self-pay | Admitting: Podiatry

## 2020-01-08 ENCOUNTER — Other Ambulatory Visit: Payer: Self-pay

## 2020-01-08 ENCOUNTER — Other Ambulatory Visit: Payer: Self-pay | Admitting: Podiatry

## 2020-01-08 DIAGNOSIS — M19071 Primary osteoarthritis, right ankle and foot: Secondary | ICD-10-CM | POA: Diagnosis not present

## 2020-01-08 DIAGNOSIS — M2060 Acquired deformities of toe(s), unspecified, unspecified foot: Secondary | ICD-10-CM

## 2020-01-08 DIAGNOSIS — M79674 Pain in right toe(s): Secondary | ICD-10-CM | POA: Diagnosis not present

## 2020-01-08 DIAGNOSIS — M2061 Acquired deformities of toe(s), unspecified, right foot: Secondary | ICD-10-CM | POA: Diagnosis not present

## 2020-01-08 DIAGNOSIS — M21619 Bunion of unspecified foot: Secondary | ICD-10-CM

## 2020-01-08 DIAGNOSIS — B351 Tinea unguium: Secondary | ICD-10-CM

## 2020-01-08 DIAGNOSIS — M6740 Ganglion, unspecified site: Secondary | ICD-10-CM

## 2020-01-08 DIAGNOSIS — M79675 Pain in left toe(s): Secondary | ICD-10-CM | POA: Diagnosis not present

## 2020-01-08 NOTE — Progress Notes (Signed)
Subjective:   Patient ID: Kristy Becker, female   DOB: 69 y.o.   MRN: [id number]   HPI 69 year old female presents to the office today for concerns of her nails becoming thickened discolored she cannot trim her self and she is concerned about fungus.  She also states that she has a sister bone spur the top of her right foot which is been ongoing for quite some time.  The area is occasionally tender but mostly with shoes and pressure denies any redness or warmth.  She also has bunions on her feet but not causing significant pain.  She has no other concerns today.  She states that she is currently wearing a heart monitor and she does not want anything invasive performed today.    Review of Systems  All other systems reviewed and are negative.  Past Medical History:  Diagnosis Date   ALLERGIC RHINITIS 10/06/2007   ANXIETY 01/31/2007   BACK PAIN 08/21/2007   COLONIC POLYPS, HX OF 02/26/2009   ECZEMA, HANDS 05/08/2007   HYPERTENSION 01/31/2007    Past Surgical History:  Procedure Laterality Date   TONSILLECTOMY AND ADENOIDECTOMY       Current Outpatient Medications:    benazepril-hydrochlorthiazide (LOTENSIN HCT) 20-12.5 MG tablet, TAKE 1 TABLET BY MOUTH EVERY DAY, Disp: 90 tablet, Rfl: 0   Ca Phosphate-Cholecalciferol (CALTRATE GUMMY BITES PO), Take 1 tablet by mouth every morning., Disp: , Rfl:    cetirizine (ZYRTEC) 10 MG tablet, Take 10 mg by mouth daily., Disp: , Rfl:    dorzolamidel-timolol (COSOPT) 22.3-6.8 MG/ML SOLN ophthalmic solution, 1 drop 2 (two) times daily., Disp: , Rfl:    famotidine (PEPCID) 20 MG tablet, Take 20 mg by mouth 2 (two) times daily. OTC, Disp: , Rfl:    HYDROXYZINE HCL PO, Take 25 mg by mouth as needed., Disp: , Rfl:    Latanoprostene Bunod (VYZULTA) 0.024 % SOLN, Apply to eye., Disp: , Rfl:    MOMETASONE FUROATE NA, Place 2 sprays into the nose daily. 50 mcg, Disp: , Rfl:    Multiple Vitamin (MULTIVITAMIN WITH MINERALS) TABS, Take 1 tablet  by mouth daily., Disp: , Rfl:    Multiple Vitamins-Minerals (CENTRUM SILVER 50+WOMEN) TABS, Take by mouth daily. OTC, Disp: , Rfl:    Olopatadine HCl 0.6 % SOLN, Place 2 puffs into the nose as needed., Disp: , Rfl:   Allergies  Allergen Reactions   Azithromycin     nausea   Cephalosporins Other (See Comments)    unknown   Clarithromycin Other (See Comments)    unknown   Doxycycline Hyclate Other (See Comments)   Penicillins Hives          Objective:  Physical Exam  General: AAO x3, NAD  Dermatological: Nails are hypertrophic, dystrophic, brittle, discolored, elongated 10. No surrounding redness or drainage. Tenderness nails 1-5 bilaterally. No open lesions or pre-ulcerative lesions are identified today.  Vascular: Dorsalis Pedis artery and Posterior Tibial artery pedal pulses are 2/4 bilateral with immedate capillary fill time.  There is no pain with calf compression, swelling, warmth, erythema.   Neruologic: Grossly intact via light touch bilateral.  Sensation intact with Semmes Weinstein monofilament.  Musculoskeletal: Dorsal prominence present on the right midfoot and there is a small mobile soft tissue mass consistent with ganglion cyst along this area dorsally.  There is no erythema or warmth there is no significant tenderness to palpation.  No deformities are present without any pain today.  There is no other area discomfort identified today.  Muscular strength 5/5 in all groups tested bilateral.  Gait: Unassisted, Nonantalgic.       Assessment:   69 year old female right dorsal foot exostosis/ganglion cyst; symptomatic onychomycosis; bunion deformity bilaterally    Plan:  -Treatment options discussed including all alternatives, risks, and complications -Etiology of symptoms were discussed -X-rays obtained reviewed.  Dorsal exostosis is present of the midfoot arthritic changes present the midfoot on the right side.  No evidence of acute fracture.  Bony  deformity is evident. -Nails debrided 10 without complications or bleeding.  Discussed treatment options for nail fungus but she wants to hold off any of this and continue to come in to have routine debridement as needed for pain. -Discussed daily injection on the small cyst, exostosis patient was followed up on this.  Discussed re-lacing her shoes to avoid pressure. -Daily foot inspection -Follow-up in 3 months or sooner if any problems arise. In the meantime, encouraged to call the office with any questions, concerns, change in symptoms.   Celesta Gentile, DPM

## 2020-01-09 ENCOUNTER — Ambulatory Visit (INDEPENDENT_AMBULATORY_CARE_PROVIDER_SITE_OTHER): Payer: Medicare PPO

## 2020-01-09 DIAGNOSIS — Z0000 Encounter for general adult medical examination without abnormal findings: Secondary | ICD-10-CM | POA: Diagnosis not present

## 2020-01-09 NOTE — Progress Notes (Signed)
Subjective:   Kristy Becker is a 69 y.o. female who presents for Medicare Annual (Subsequent) preventive examination.  I connected with Lurline Idol  today by telephone and verified that I am speaking with the correct person using two identifiers. Location patient: home Location provider: work Persons participating in the virtual visit: patient, provider.   I discussed the limitations, risks, security and privacy concerns of performing an evaluation and management service by telephone and the availability of in person appointments. I also discussed with the patient that there may be a patient responsible charge related to this service. The patient expressed understanding and verbally consented to this telephonic visit.    Interactive audio and video telecommunications were attempted between this provider and patient, however failed, due to patient having technical difficulties OR patient did not have access to video capability.  We continued and completed visit with audio only.      Review of Systems    N/A Cardiac Risk Factors include: advanced age (>60men, >63 women);hypertension     Objective:    There were no vitals filed for this visit. There is no height or weight on file to calculate BMI.  Advanced Directives 01/09/2020 12/07/2016  Does Patient Have a Medical Advance Directive? Yes No  Type of Paramedic of [place];Living will -  Does patient want to make changes to medical advance directive? No - Patient declined -  Copy of [place] in Chart? No - copy requested -  Would patient like information on creating a medical advance directive? - No - Patient declined    Current Medications (verified) Outpatient Encounter Medications as of 01/09/2020  Medication Sig  . benazepril-hydrochlorthiazide (LOTENSIN HCT) 20-12.5 MG tablet TAKE 1 TABLET BY MOUTH EVERY DAY  . cetirizine (ZYRTEC) 10 MG tablet Take 10 mg by mouth daily.  .  dorzolamidel-timolol (COSOPT) 22.3-6.8 MG/ML SOLN ophthalmic solution 1 drop 2 (two) times daily.  . famotidine (PEPCID) 20 MG tablet Take 20 mg by mouth 2 (two) times daily. OTC  . Latanoprostene Bunod (VYZULTA) 0.024 % SOLN Apply to eye.  . Multiple Vitamin (MULTIVITAMIN WITH MINERALS) TABS Take 1 tablet by mouth daily.  . Multiple Vitamins-Minerals (CENTRUM SILVER 50+WOMEN) TABS Take by mouth daily. OTC  . Olopatadine HCl 0.6 % SOLN Place 2 puffs into the nose as needed.  . Ca Phosphate-Cholecalciferol (CALTRATE GUMMY BITES PO) Take 1 tablet by mouth every morning. (Patient not taking: Reported on 01/09/2020)  . HYDROXYZINE HCL PO Take 25 mg by mouth as needed. (Patient not taking: Reported on 01/09/2020)  . MOMETASONE FUROATE NA Place 2 sprays into the nose daily. 50 mcg (Patient not taking: Reported on 01/09/2020)   No facility-administered encounter medications on file as of 01/09/2020.    Allergies (verified) Azithromycin, Cephalosporins, Clarithromycin, Doxycycline hyclate, and Penicillins   History: Past Medical History:  Diagnosis Date  . ALLERGIC RHINITIS 10/06/2007  . ANXIETY 01/31/2007  . BACK PAIN 08/21/2007  . COLONIC POLYPS, HX OF 02/26/2009  . ECZEMA, HANDS 05/08/2007  . Glaucoma   . HYPERTENSION 01/31/2007   Past Surgical History:  Procedure Laterality Date  . TONSILLECTOMY AND ADENOIDECTOMY     History reviewed. No pertinent family history. Social History   Socioeconomic History  . Marital status: Married    Spouse name: Not on file  . Number of children: Not on file  . Years of education: Not on file  . Highest education level: Not on file  Occupational History  .  Not on file  Tobacco Use  . Smoking status: Never Smoker  . Smokeless tobacco: Never Used  Substance and Sexual Activity  . Alcohol use: No  . Drug use: No  . Sexual activity: Not on file  Other Topics Concern  . Not on file  Social History Narrative  . Not on file   Social Determinants of  Health   Financial Resource Strain: Low Risk   . Difficulty of Paying Living Expenses: Not hard at all  Food Insecurity: No Food Insecurity  . Worried About Charity fundraiser in the Last Year: Never true  . Ran Out of Food in the Last Year: Never true  Transportation Needs: No Transportation Needs  . Lack of Transportation (Medical): No  . Lack of Transportation (Non-Medical): No  Physical Activity: Inactive  . Days of Exercise per Week: 0 days  . Minutes of Exercise per Session: 0 min  Stress: No Stress Concern Present  . Feeling of Stress : Not at all  Social Connections: Moderately Isolated  . Frequency of Communication with Friends and Family: Three times a week  . Frequency of Social Gatherings with Friends and Family: Twice a week  . Attends Religious Services: More than 4 times per year  . Active Member of Clubs or Organizations: No  . Attends Archivist Meetings: Never  . Marital Status: Widowed    Tobacco Counseling Counseling given: Not Answered   Clinical Intake:  Pre-visit preparation completed: Yes  Pain : No/denies pain     Nutritional Status: BMI 25 -29 Overweight Nutritional Risks: None Diabetes: No  How often do you need to have someone help you when you read instructions, pamphlets, or other written materials from your doctor or pharmacy?: 1 - Never What is the last grade level you completed in school?: College  Diabetic?No  Interpreter Needed?: No  Information entered by :: [place] of Daily Living In your present state of health, do you have any difficulty performing the following activities: 01/09/2020  Hearing? N  Vision? Y  Comment Has difficulty seeing with distance due to cataracts, and glaucoma  Difficulty concentrating or making decisions? N  Walking or climbing stairs? N  Dressing or bathing? N  Doing errands, shopping? N  Preparing Food and eating ? N  Using the Toilet? N  In the past six months, have  you accidently leaked urine? N  Do you have problems with loss of bowel control? N  Managing your Medications? N  Managing your Finances? N  Housekeeping or managing your Housekeeping? N  Some recent data might be hidden    Patient Care Team: Billie Ruddy, MD as PCP - General (Family Medicine) Buford Dresser, MD as PCP - Cardiology (Cardiology)  Indicate any recent Medical Services you may have received from other than [facility] providers in the past year (date may be approximate).     Assessment:   This is a routine wellness examination for Sherine.  Hearing/Vision screen  Hearing Screening   125Hz  250Hz  500Hz  1000Hz  2000Hz  3000Hz  4000Hz  6000Hz  8000Hz   Right ear:           Left ear:           Vision Screening Comments: Gets eyes checked annually and see's specialist at [facility] multiple times a year for Glaucoma   Dietary issues and exercise activities discussed: Current Exercise Habits: The patient does not participate in regular exercise at present, Exercise limited by: None identified  Goals    .  DIET - INCREASE WATER INTAKE     I will drink my water intake daily     . Patient Stated     I will continue to take my medications as prescribed      Depression Screen PHQ 2/9 Scores 01/09/2020 04/26/2019 11/30/2016 11/04/2015 04/11/2015 04/08/2014 04/06/2013  PHQ - 2 Score 1 2 0 1 2 0 0  PHQ- 9 Score 1 2 - - - - -    Fall Risk Fall Risk  01/09/2020 11/30/2016 11/04/2015 04/11/2015 04/08/2014  Falls in the past year? 0 No No No No  Number falls in past yr: 0 - - - -  Injury with Fall? 0 - - - -    Any stairs in or around the home? No  If so, are there any without handrails? No  Home free of loose throw rugs in walkways, pet beds, electrical cords, etc? Yes  Adequate lighting in your home to reduce risk of falls? Yes   ASSISTIVE DEVICES UTILIZED TO PREVENT FALLS:  Life alert? No  Use of a cane, walker or w/c? No  Grab bars in the bathroom? Yes  Shower chair or  bench in shower? No  Elevated toilet seat or a handicapped toilet? Yes   Cognitive Function:     6CIT Screen 01/09/2020  What Year? 0 points  What month? 0 points  What time? 0 points  Count back from 20 0 points  Months in reverse 0 points  Repeat phrase 4 points  Total Score 4    Immunizations Immunization History  Administered Date(s) Administered  . Fluad Quad(high Dose 65+) 04/26/2019  . Influenza, High Dose Seasonal PF 04/11/2015, 04/15/2016, 03/31/2017, 05/01/2018  . Influenza,inj,Quad PF,6+ Mos 04/06/2013, 04/08/2014  . Influenza-Unspecified 03/14/2016  . Pneumococcal Conjugate-13 05/28/2015  . Pneumococcal Polysaccharide-23 06/02/2016  . Td 09/12/2009    TDAP status: Due, Education has been provided regarding the importance of this vaccine. Advised may receive this vaccine at local pharmacy or Health Dept. Aware to provide a copy of the vaccination record if obtained from local pharmacy or Health Dept. Verbalized acceptance and understanding. Flu Vaccine status: Up to date Pneumococcal vaccine status: Up to date Covid-19 vaccine status: Completed vaccines  Qualifies for Shingles Vaccine? Yes   Zostavax completed No   Shingrix Completed?: No.    Education has been provided regarding the importance of this vaccine. Patient has been advised to call insurance company to determine out of pocket expense if they have not yet received this vaccine. Advised may also receive vaccine at local pharmacy or Health Dept. Verbalized acceptance and understanding.  Screening Tests Health Maintenance  Topic Date Due  . Hepatitis C Screening  Never done  . COVID-19 Vaccine (1) Never done  . MAMMOGRAM  01/15/2016  . TETANUS/TDAP  09/13/2019  . INFLUENZA VACCINE  01/13/2020  . COLONOSCOPY  01/14/2027  . DEXA SCAN  Completed  . PNA vac Low Risk Adult  Completed    Health Maintenance  Health Maintenance Due  Topic Date Due  . Hepatitis C Screening  Never done  . COVID-19  Vaccine (1) Never done  . MAMMOGRAM  01/15/2016  . TETANUS/TDAP  09/13/2019    Colorectal cancer screening: Completed 01/13/2017. Repeat every 10 years Mammogram status: Completed 03/2019. Repeat every year Bone Density status: Completed 11/11/2015. Results reflect: Bone density results: NORMAL. Repeat every 2 years.  Lung Cancer Screening: (Low Dose CT Chest recommended if Age 45-80 years, 30 pack-year currently smoking OR have quit w/in 15years.)  does not qualify.   Lung Cancer Screening Referral: N/A  Additional Screening:  Hepatitis C Screening: does qualify;   Vision Screening: Recommended annual ophthalmology exams for early detection of glaucoma and other disorders of the eye. Is the patient up to date with their annual eye exam?  Yes  Who is the provider or what is the name of the office in which the patient attends annual eye exams? Dr. Arlis Porta, and Dr. Katy Fitch  If pt is not established with a provider, would they like to be referred to a provider to establish care? No .   Dental Screening: Recommended annual dental exams for proper oral hygiene  Community Resource Referral / Chronic Care Management: CRR required this visit?  No   CCM required this visit?  No      Plan:     I have personally reviewed and noted the following in the patient's chart:   . Medical and social history . Use of alcohol, tobacco or illicit drugs  . Current medications and supplements . Functional ability and status . Nutritional status . Physical activity . Advanced directives . List of other physicians . Hospitalizations, surgeries, and ER visits in previous 12 months . Vitals . Screenings to include cognitive, depression, and falls . Referrals and appointments  In addition, I have reviewed and discussed with patient certain preventive protocols, quality metrics, and best practice recommendations. A written personalized care plan for preventive services as well as general preventive  health recommendations were provided to patient.     Ofilia Neas, LPN   [id number]/[id number]   Nurse Notes: Patient expressed some concerns with feeling down and depressed due to recent diagnoses of glaucoma. She feels that she needs to speak with a counselor. I advised her to scheduled and appointment with PCP. Patient stated she would call back and schedule.

## 2020-01-09 NOTE — Patient Instructions (Signed)
Kristy Becker , Thank you for taking time to come for your Medicare Wellness Visit. I appreciate your ongoing commitment to your health goals. Please review the following plan we discussed and let me know if I can assist you in the future.   Screening recommendations/referrals: Colonoscopy: up to date, next due 01/14/2027 Mammogram: Up to date, per patient due in October. Please have previous mammogram results sent to our office  Bone Density: No longer required  Recommended yearly ophthalmology/optometry visit for glaucoma screening and checkup Recommended yearly dental visit for hygiene and checkup  Vaccinations: Influenza vaccine: Up to date, next due 01/2020 Pneumococcal vaccine: Completed series  Tdap vaccine: Currently due, please check with your insurance to discuss cost or await injury  Shingles vaccine: Currently due, please check with your pharmacy in regards to cost and to receive     Advanced directives: Please bring a copy to your next office visit  Conditions/risks identified: Please call our office and schedule and appointment with Dr. Volanda Napoleon in regards to feeling down  Next appointment: None    Preventive Care 70 Years and Older, Female Preventive care refers to lifestyle choices and visits with your health care provider that can promote health and wellness. What does preventive care include?  A yearly physical exam. This is also called an annual well check.  Dental exams once or twice a year.  Routine eye exams. Ask your health care provider how often you should have your eyes checked.  Personal lifestyle choices, including:  Daily care of your teeth and gums.  Regular physical activity.  Eating a healthy diet.  Avoiding tobacco and drug use.  Limiting alcohol use.  Practicing safe sex.  Taking low-dose aspirin every day.  Taking vitamin and mineral supplements as recommended by your health care provider. What happens during an annual well check? The  services and screenings done by your health care provider during your annual well check will depend on your age, overall health, lifestyle risk factors, and family history of disease. Counseling  Your health care provider may ask you questions about your:  Alcohol use.  Tobacco use.  Drug use.  Emotional well-being.  Home and relationship well-being.  Sexual activity.  Eating habits.  History of falls.  Memory and ability to understand (cognition).  Work and work Statistician.  Reproductive health. Screening  You may have the following tests or measurements:  Height, weight, and BMI.  Blood pressure.  Lipid and cholesterol levels. These may be checked every 5 years, or more frequently if you are over 90 years old.  Skin check.  Lung cancer screening. You may have this screening every year starting at age 40 if you have a 30-pack-year history of smoking and currently smoke or have quit within the past 15 years.  Fecal occult blood test (FOBT) of the stool. You may have this test every year starting at age 70.  Flexible sigmoidoscopy or colonoscopy. You may have a sigmoidoscopy every 5 years or a colonoscopy every 10 years starting at age 70.  Hepatitis C blood test.  Hepatitis B blood test.  Sexually transmitted disease (STD) testing.  Diabetes screening. This is done by checking your blood sugar (glucose) after you have not eaten for a while (fasting). You may have this done every 1-3 years.  Bone density scan. This is done to screen for osteoporosis. You may have this done starting at age 70.  Mammogram. This may be done every 1-2 years. Talk to your health care provider  about how often you should have regular mammograms. Talk with your health care provider about your test results, treatment options, and if necessary, the need for more tests. Vaccines  Your health care provider may recommend certain vaccines, such as:  Influenza vaccine. This is recommended  every year.  Tetanus, diphtheria, and acellular pertussis (Tdap, Td) vaccine. You may need a Td booster every 10 years.  Zoster vaccine. You may need this after age 70.  Pneumococcal 13-valent conjugate (PCV13) vaccine. One dose is recommended after age 70.  Pneumococcal polysaccharide (PPSV23) vaccine. One dose is recommended after age 70. Talk to your health care provider about which screenings and vaccines you need and how often you need them. This information is not intended to replace advice given to you by your health care provider. Make sure you discuss any questions you have with your health care provider. Document Released: 06/27/2015 Document Revised: 02/18/2016 Document Reviewed: 04/01/2015 Elsevier Interactive Patient Education  2017 [place] Prevention in the Home Falls can cause injuries. They can happen to people of all ages. There are many things you can do to make your home safe and to help prevent falls. What can I do on the outside of my home?  Regularly fix the edges of walkways and driveways and fix any cracks.  Remove anything that might make you trip as you walk through a door, such as a raised step or threshold.  Trim any bushes or trees on the path to your home.  Use bright outdoor lighting.  Clear any walking paths of anything that might make someone trip, such as rocks or tools.  Regularly check to see if handrails are loose or broken. Make sure that both sides of any steps have handrails.  Any raised decks and porches should have guardrails on the edges.  Have any leaves, snow, or ice cleared regularly.  Use sand or salt on walking paths during winter.  Clean up any spills in your garage right away. This includes oil or grease spills. What can I do in the bathroom?  Use night lights.  Install grab bars by the toilet and in the tub and shower. Do not use towel bars as grab bars.  Use non-skid mats or decals in the tub or shower.  If  you need to sit down in the shower, use a plastic, non-slip stool.  Keep the floor dry. Clean up any water that spills on the floor as soon as it happens.  Remove soap buildup in the tub or shower regularly.  Attach bath mats securely with double-sided non-slip rug tape.  Do not have throw rugs and other things on the floor that can make you trip. What can I do in the bedroom?  Use night lights.  Make sure that you have a light by your bed that is easy to reach.  Do not use any sheets or blankets that are too big for your bed. They should not hang down onto the floor.  Have a firm chair that has side arms. You can use this for support while you get dressed.  Do not have throw rugs and other things on the floor that can make you trip. What can I do in the kitchen?  Clean up any spills right away.  Avoid walking on wet floors.  Keep items that you use a lot in easy-to-reach places.  If you need to reach something above you, use a strong step stool that has a grab bar.  Keep electrical cords out of the way.  Do not use floor polish or wax that makes floors slippery. If you must use wax, use non-skid floor wax.  Do not have throw rugs and other things on the floor that can make you trip. What can I do with my stairs?  Do not leave any items on the stairs.  Make sure that there are handrails on both sides of the stairs and use them. Fix handrails that are broken or loose. Make sure that handrails are as long as the stairways.  Check any carpeting to make sure that it is firmly attached to the stairs. Fix any carpet that is loose or worn.  Avoid having throw rugs at the top or bottom of the stairs. If you do have throw rugs, attach them to the floor with carpet tape.  Make sure that you have a light switch at the top of the stairs and the bottom of the stairs. If you do not have them, ask someone to add them for you. What else can I do to help prevent falls?  Wear shoes  that:  Do not have high heels.  Have rubber bottoms.  Are comfortable and fit you well.  Are closed at the toe. Do not wear sandals.  If you use a stepladder:  Make sure that it is fully opened. Do not climb a closed stepladder.  Make sure that both sides of the stepladder are locked into place.  Ask someone to hold it for you, if possible.  Clearly mark and make sure that you can see:  Any grab bars or handrails.  First and last steps.  Where the edge of each step is.  Use tools that help you move around (mobility aids) if they are needed. These include:  Canes.  Walkers.  Scooters.  Crutches.  Turn on the lights when you go into a dark area. Replace any light bulbs as soon as they burn out.  Set up your furniture so you have a clear path. Avoid moving your furniture around.  If any of your floors are uneven, fix them.  If there are any pets around you, be aware of where they are.  Review your medicines with your doctor. Some medicines can make you feel dizzy. This can increase your chance of falling. Ask your doctor what other things that you can do to help prevent falls. This information is not intended to replace advice given to you by your health care provider. Make sure you discuss any questions you have with your health care provider. Document Released: 03/27/2009 Document Revised: 11/06/2015 Document Reviewed: 07/05/2014 Elsevier Interactive Patient Education  2017 [employer].

## 2020-01-17 DIAGNOSIS — H401133 Primary open-angle glaucoma, bilateral, severe stage: Secondary | ICD-10-CM | POA: Diagnosis not present

## 2020-01-17 DIAGNOSIS — H2513 Age-related nuclear cataract, bilateral: Secondary | ICD-10-CM | POA: Diagnosis not present

## 2020-02-04 DIAGNOSIS — H2512 Age-related nuclear cataract, left eye: Secondary | ICD-10-CM | POA: Diagnosis not present

## 2020-02-04 DIAGNOSIS — H401123 Primary open-angle glaucoma, left eye, severe stage: Secondary | ICD-10-CM | POA: Diagnosis not present

## 2020-03-14 ENCOUNTER — Encounter: Payer: Self-pay | Admitting: *Deleted

## 2020-03-24 DIAGNOSIS — H2511 Age-related nuclear cataract, right eye: Secondary | ICD-10-CM | POA: Diagnosis not present

## 2020-03-24 DIAGNOSIS — H401113 Primary open-angle glaucoma, right eye, severe stage: Secondary | ICD-10-CM | POA: Diagnosis not present

## 2020-03-25 DIAGNOSIS — Z961 Presence of intraocular lens: Secondary | ICD-10-CM | POA: Insufficient documentation

## 2020-03-31 ENCOUNTER — Other Ambulatory Visit: Payer: Self-pay | Admitting: Family Medicine

## 2020-04-10 ENCOUNTER — Ambulatory Visit: Payer: Medicare PPO | Admitting: Podiatry

## 2020-05-19 DIAGNOSIS — H16143 Punctate keratitis, bilateral: Secondary | ICD-10-CM | POA: Diagnosis not present

## 2020-05-19 DIAGNOSIS — H401133 Primary open-angle glaucoma, bilateral, severe stage: Secondary | ICD-10-CM | POA: Diagnosis not present

## 2020-06-02 DIAGNOSIS — H16143 Punctate keratitis, bilateral: Secondary | ICD-10-CM | POA: Diagnosis not present

## 2020-06-13 ENCOUNTER — Other Ambulatory Visit: Payer: Self-pay | Admitting: Family Medicine

## 2020-07-31 DIAGNOSIS — H401133 Primary open-angle glaucoma, bilateral, severe stage: Secondary | ICD-10-CM | POA: Diagnosis not present

## 2020-08-20 DIAGNOSIS — K219 Gastro-esophageal reflux disease without esophagitis: Secondary | ICD-10-CM | POA: Diagnosis not present

## 2020-08-20 DIAGNOSIS — R1032 Left lower quadrant pain: Secondary | ICD-10-CM | POA: Diagnosis not present

## 2020-08-21 ENCOUNTER — Ambulatory Visit: Payer: Medicare PPO | Admitting: Family Medicine

## 2020-08-22 ENCOUNTER — Other Ambulatory Visit: Payer: Self-pay

## 2020-08-22 ENCOUNTER — Ambulatory Visit: Payer: Medicare PPO | Admitting: Family Medicine

## 2020-08-25 ENCOUNTER — Other Ambulatory Visit: Payer: Self-pay

## 2020-08-25 ENCOUNTER — Ambulatory Visit: Payer: Medicare PPO | Admitting: Family Medicine

## 2020-08-25 VITALS — BP 137/74 | Temp 98.6°F | Wt 163.0 lb

## 2020-08-25 DIAGNOSIS — F411 Generalized anxiety disorder: Secondary | ICD-10-CM | POA: Diagnosis not present

## 2020-08-25 DIAGNOSIS — I10 Essential (primary) hypertension: Secondary | ICD-10-CM | POA: Diagnosis not present

## 2020-08-25 DIAGNOSIS — H409 Unspecified glaucoma: Secondary | ICD-10-CM | POA: Diagnosis not present

## 2020-08-25 DIAGNOSIS — R030 Elevated blood-pressure reading, without diagnosis of hypertension: Secondary | ICD-10-CM

## 2020-08-25 NOTE — Progress Notes (Addendum)
Subjective:    Patient ID: Kristy Becker, female    DOB: July 03, 1950, 70 y.o.   MRN: [id number]  No chief complaint on file.   HPI Patient is a 70 yo female w/ pmh sig for anxiety, HTN, glaucoma, seasonal allergies who was seen for acute concern.  Pt notes elevated bp 163/69 at GI appointment.  Repeat was 916 systolic.  At home bp over the weekend: 131/73, 132/67, 132/66, 128/65.  Patient notes BP has been elevated 2/2 increased anxiety as to have surgery for glaucoma.  States left eye pressure is 3 and right eye pressure 7.  Pt started counseling.  Past Medical History:  Diagnosis Date  . ALLERGIC RHINITIS 10/06/2007  . ANXIETY 01/31/2007  . BACK PAIN 08/21/2007  . COLONIC POLYPS, HX OF 02/26/2009  . ECZEMA, HANDS 05/08/2007  . Glaucoma   . HYPERTENSION 01/31/2007    Allergies  Allergen Reactions  . Azithromycin     nausea  . Cephalosporins Other (See Comments)    unknown  . Clarithromycin Other (See Comments)    unknown  . Doxycycline Hyclate Other (See Comments)  . Penicillins Hives    ROS General: Denies fever, chills, night sweats, changes in weight, changes in appetite HEENT: Denies headaches, ear pain, changes in vision, rhinorrhea, sore throat +increased eye pressure CV: Denies CP, palpitations, SOB, orthopnea Pulm: Denies SOB, cough, wheezing GI: Denies abdominal pain, nausea, vomiting, diarrhea, constipation GU: Denies dysuria, hematuria, frequency, vaginal discharge Msk: Denies muscle cramps, joint pains Neuro: Denies weakness, numbness, tingling Skin: Denies rashes, bruising Psych: Denies depression hallucinations  + anxiety    Objective:    Blood pressure 137/74, temperature 98.6 F (37 C), weight 163 lb (73.9 kg).  Gen. Pleasant, well-nourished, in no distress, normal affect   HEENT: [place]/AT, face symmetric, conjunctiva clear, no scleral icterus, PERRLA, EOMI, nares patent without drainage Lungs: no accessory muscle use, CTAB, no wheezes or  rales Cardiovascular: RRR, no m/r/g, no peripheral edema Musculoskeletal: No deformities, no cyanosis or clubbing, normal tone Neuro:  A&Ox3, CN II-XII intact, normal gait Skin:  Warm, no lesions/ rash   Wt Readings from Last 3 Encounters:  01/02/20 159 lb (72.1 kg)  12/12/19 159 lb (72.1 kg)  11/29/19 157 lb (71.2 kg)    Lab Results  Component Value Date   WBC 6.0 12/12/2019   HGB 13.5 12/12/2019   HCT 39.6 12/12/2019   PLT 239.0 12/12/2019   GLUCOSE 86 11/29/2019   CHOL 189 10/28/2015   TRIG 64.0 10/28/2015   HDL 56.10 10/28/2015   LDLDIRECT 143.7 02/20/2010   LDLCALC 120 (H) 10/28/2015   ALT 23 04/08/2016   AST 16 04/08/2016   NA 137 11/29/2019   K 4.1 11/29/2019   CL 102 11/29/2019   CREATININE 0.74 11/29/2019   BUN 17 11/29/2019   CO2 28 11/29/2019   TSH 0.31 (L) 12/12/2019   INR 1.01 09/29/2009    Assessment/Plan:  GAD (generalized anxiety disorder) -GAD 7 11 -PHQ 9 score 8 -Discussed the importance of continuing self-care -Continue deep breathing and other relaxation techniques. -Continue counseling -Consider medication options for continued or worsening symptoms  Glaucoma of right eye, unspecified glaucoma type -Continue current medications including Cosopt, vyzulta, olopatadine -Continue follow-up with ophthalmology  Essential hypertension -Stable in office. -Elevation likely situational 2/2 increased anxiety -Discussed importance of continuing lifestyle modifications and self-care -Continue current medications benazepril-hydrochlorothiazide 20-12.5 mg daily -Continue checking BP at home and keep a log to bring with you to clinic -Given precautions  F/u in 1 month, sooner if needed.  Grier Mitts, MD

## 2020-08-25 NOTE — Patient Instructions (Signed)
[email or web address]">  Generalized Anxiety Disorder, Adult Generalized anxiety disorder (GAD) is a mental health condition. Unlike normal worries, anxiety related to GAD is not triggered by a specific event. These worries do not fade or get better with time. GAD interferes with relationships, work, and school. GAD symptoms can vary from mild to severe. People with severe GAD can have intense waves of anxiety with physical symptoms that are similar to panic attacks. What are the causes? The exact cause of GAD is not known, but the following are believed to have an impact:  Differences in natural brain chemicals.  Genes passed down from parents to children.  Differences in the way threats are perceived.  Development during childhood.  Personality. What increases the risk? The following factors may make you more likely to develop this condition:  Being female.  Having a family history of anxiety disorders.  Being very shy.  Experiencing very stressful life events, such as the death of a loved one.  Having a very stressful family environment. What are the signs or symptoms? People with GAD often worry excessively about many things in their lives, such as their health and family. Symptoms may also include:  Mental and emotional symptoms: ? Worrying excessively about natural disasters. ? Fear of being late. ? Difficulty concentrating. ? Fears that others are judging your performance.  Physical symptoms: ? Fatigue. ? Headaches, muscle tension, muscle twitches, trembling, or feeling shaky. ? Feeling like your heart is pounding or beating very fast. ? Feeling out of breath or like you cannot take a deep breath. ? Having trouble falling asleep or staying asleep, or experiencing restlessness. ? Sweating. ? Nausea, diarrhea, or irritable bowel syndrome (IBS).  Behavioral symptoms: ? Experiencing erratic moods or irritability. ? Avoidance of new situations. ? Avoidance of  people. ? Extreme difficulty making decisions. How is this diagnosed? This condition is diagnosed based on your symptoms and medical history. You will also have a physical exam. Your health care provider may perform tests to rule out other possible causes of your symptoms. To be diagnosed with GAD, a person must have anxiety that:  Is out of his or her control.  Affects several different aspects of his or her life, such as work and relationships.  Causes distress that makes him or her unable to take part in normal activities.  Includes at least three symptoms of GAD, such as restlessness, fatigue, trouble concentrating, irritability, muscle tension, or sleep problems. Before your health care provider can confirm a diagnosis of GAD, these symptoms must be present more days than they are not, and they must last for 6 months or longer. How is this treated? This condition may be treated with:  Medicine. Antidepressant medicine is usually prescribed for long-term daily control. Anti-anxiety medicines may be added in severe cases, especially when panic attacks occur.  Talk therapy (psychotherapy). Certain types of talk therapy can be helpful in treating GAD by providing support, education, and guidance. Options include: ? Cognitive behavioral therapy (CBT). People learn coping skills and self-calming techniques to ease their physical symptoms. They learn to identify unrealistic thoughts and behaviors and to replace them with more appropriate thoughts and behaviors. ? Acceptance and commitment therapy (ACT). This treatment teaches people how to be mindful as a way to cope with unwanted thoughts and feelings. ? Biofeedback. This process trains you to manage your body's response (physiological response) through breathing techniques and relaxation methods. You will work with a therapist while machines are used to monitor your physical   symptoms.  Stress management techniques. These include yoga,  meditation, and exercise. A mental health specialist can help determine which treatment is best for you. Some people see improvement with one type of therapy. However, other people require a combination of therapies.   Follow these instructions at home: Lifestyle  Maintain a consistent routine and schedule.  Anticipate stressful situations. Create a plan, and allow extra time to work with your plan.  Practice stress management or self-calming techniques that you have learned from your therapist or your health care provider. General instructions  Take over-the-counter and prescription medicines only as told by your health care provider.  Understand that you are likely to have setbacks. Accept this and be kind to yourself as you persist to take better care of yourself.  Recognize and accept your accomplishments, even if you judge them as small.  Keep all follow-up visits as told by your health care provider. This is important. Contact a health care provider if:  Your symptoms do not get better.  Your symptoms get worse.  You have signs of depression, such as: ? A persistently sad or irritable mood. ? Loss of enjoyment in activities that used to bring you joy. ? Change in weight or eating. ? Changes in sleeping habits. ? Avoiding friends or family members. ? Loss of energy for normal tasks. ? Feelings of guilt or worthlessness. Get help right away if:  You have serious thoughts about hurting yourself or others. If you ever feel like you may hurt yourself or others, or have thoughts about taking your own life, get help right away. Go to your nearest emergency department or:  Call your local emergency services (911 in the [place]  Call a suicide crisis helpline, such as the [facility] at (850)760-0973. This is open 24 hours a day in the [place]  Text the Crisis Text Line at (463)570-3490 (in the [place].). Summary  Generalized anxiety disorder (GAD) is a mental  health condition that involves worry that is not triggered by a specific event.  People with GAD often worry excessively about many things in their lives, such as their health and family.  GAD may cause symptoms such as restlessness, trouble concentrating, sleep problems, frequent sweating, nausea, diarrhea, headaches, and trembling or muscle twitching.  A mental health specialist can help determine which treatment is best for you. Some people see improvement with one type of therapy. However, other people require a combination of therapies. This information is not intended to replace advice given to you by your health care provider. Make sure you discuss any questions you have with your health care provider. Document Revised: 03/21/2019 Document Reviewed: 03/21/2019 Elsevier Patient Education  [place], Adult After being diagnosed with an anxiety disorder, you may be relieved to know why you have felt or behaved a certain way. You may also feel overwhelmed about the treatment ahead and what it will mean for your life. With care and support, you can manage this condition and recover from it. How to manage lifestyle changes Managing stress and anxiety Stress is your body's reaction to life changes and events, both good and bad. Most stress will last just a few hours, but stress can be ongoing and can lead to more than just stress. Although stress can play a major role in anxiety, it is not the same as anxiety. Stress is usually caused by something external, such as a deadline, test, or competition. Stress normally passes after the triggering  event has ended.  Anxiety is caused by something internal, such as imagining a terrible outcome or worrying that something will go wrong that will devastate you. Anxiety often does not go away even after the triggering event is over, and it can become long-term (chronic) worry. It is important to understand the differences between  stress and anxiety and to manage your stress effectively so that it does not lead to an anxious response. Talk with your health care provider or a counselor to learn more about reducing anxiety and stress. He or she may suggest tension reduction techniques, such as:  Music therapy. This can include creating or listening to music that you enjoy and that inspires you.  Mindfulness-based meditation. This involves being aware of your normal breaths while not trying to control your breathing. It can be done while sitting or walking.  Centering prayer. This involves focusing on a word, phrase, or sacred image that means something to you and brings you peace.  Deep breathing. To do this, expand your stomach and inhale slowly through your nose. Hold your breath for 3-5 seconds. Then exhale slowly, letting your stomach muscles relax.  Self-talk. This involves identifying thought patterns that lead to anxiety reactions and changing those patterns.  Muscle relaxation. This involves tensing muscles and then relaxing them. Choose a tension reduction technique that suits your lifestyle and personality. These techniques take time and practice. Set aside 5-15 minutes a day to do them. Therapists can offer counseling and training in these techniques. The training to help with anxiety may be covered by some insurance plans. Other things you can do to manage stress and anxiety include:  Keeping a stress/anxiety diary. This can help you learn what triggers your reaction and then learn ways to manage your response.  Thinking about how you react to certain situations. You may not be able to control everything, but you can control your response.  Making time for activities that help you relax and not feeling guilty about spending your time in this way.  Visual imagery and yoga can help you stay calm and relax.   Medicines Medicines can help ease symptoms. Medicines for anxiety include:  Anti-anxiety  drugs.  Antidepressants. Medicines are often used as a primary treatment for anxiety disorder. Medicines will be prescribed by a health care provider. When used together, medicines, psychotherapy, and tension reduction techniques may be the most effective treatment. Relationships Relationships can play a big part in helping you recover. Try to spend more time connecting with trusted friends and family members. Consider going to couples counseling, taking family education classes, or going to family therapy. Therapy can help you and others better understand your condition. How to recognize changes in your anxiety Everyone responds differently to treatment for anxiety. Recovery from anxiety happens when symptoms decrease and stop interfering with your daily activities at home or work. This may mean that you will start to:  Have better concentration and focus. Worry will interfere less in your daily thinking.  Sleep better.  Be less irritable.  Have more energy.  Have improved memory. It is important to recognize when your condition is getting worse. Contact your health care provider if your symptoms interfere with home or work and you feel like your condition is not improving. Follow these instructions at home: Activity  Exercise. Most adults should do the following: ? Exercise for at least 150 minutes each week. The exercise should increase your heart rate and make you sweat (moderate-intensity exercise). ? Strengthening  exercises at least twice a week.  Get the right amount and quality of sleep. Most adults need 7-9 hours of sleep each night. Lifestyle  Eat a healthy diet that includes plenty of vegetables, fruits, whole grains, low-fat dairy products, and lean protein. Do not eat a lot of foods that are high in solid fats, added sugars, or salt.  Make choices that simplify your life.  Do not use any products that contain nicotine or tobacco, such as cigarettes, e-cigarettes, and  chewing tobacco. If you need help quitting, ask your health care provider.  Avoid caffeine, alcohol, and certain over-the-counter cold medicines. These may make you feel worse. Ask your pharmacist which medicines to avoid.   General instructions  Take over-the-counter and prescription medicines only as told by your health care provider.  Keep all follow-up visits as told by your health care provider. This is important. Where to find support You can get help and support from these sources:  Self-help groups.  Online and [employer].  A trusted spiritual leader.  Couples counseling.  Family education classes.  Family therapy. Where to find more information You may find that joining a support group helps you deal with your anxiety. The following sources can help you locate counselors or support groups near you:  [place]: www.mentalhealthamerica.net  Anxiety and Depression Association of [place] (ADAA): https://www.clark.net/  National Alliance on Mental Illness (NAMI): www.nami.org Contact a health care provider if you:  Have a hard time staying focused or finishing daily tasks.  Spend many hours a day feeling worried about everyday life.  Become exhausted by worry.  Start to have headaches, feel tense, or have nausea.  Urinate more than normal.  Have diarrhea. Get help right away if you have:  A racing heart and shortness of breath.  Thoughts of hurting yourself or others. If you ever feel like you may hurt yourself or others, or have thoughts about taking your own life, get help right away. You can go to your nearest emergency department or call:  Your local emergency services (911 in the [place]  A suicide crisis helpline, such as the [facility] at (316)368-8614. This is open 24 hours a day. Summary  Taking steps to learn and use tension reduction techniques can help calm you and help prevent triggering an anxiety  reaction.  When used together, medicines, psychotherapy, and tension reduction techniques may be the most effective treatment.  Family, friends, and partners can play a big part in helping you recover from an anxiety disorder. This information is not intended to replace advice given to you by your health care provider. Make sure you discuss any questions you have with your health care provider. Document Revised: 10/31/2018 Document Reviewed: 10/31/2018 Elsevier Patient Education  [place].

## 2020-09-11 ENCOUNTER — Encounter: Payer: Self-pay | Admitting: Family Medicine

## 2020-09-12 ENCOUNTER — Encounter: Payer: Self-pay | Admitting: Cardiology

## 2020-09-12 ENCOUNTER — Other Ambulatory Visit: Payer: Self-pay

## 2020-09-12 ENCOUNTER — Ambulatory Visit: Payer: Medicare PPO | Admitting: Cardiology

## 2020-09-12 VITALS — BP 120/70 | HR 62 | Ht 62.0 in | Wt 165.2 lb

## 2020-09-12 DIAGNOSIS — Z7189 Other specified counseling: Secondary | ICD-10-CM

## 2020-09-12 DIAGNOSIS — I493 Ventricular premature depolarization: Secondary | ICD-10-CM

## 2020-09-12 DIAGNOSIS — I499 Cardiac arrhythmia, unspecified: Secondary | ICD-10-CM

## 2020-09-12 DIAGNOSIS — I10 Essential (primary) hypertension: Secondary | ICD-10-CM | POA: Diagnosis not present

## 2020-09-12 DIAGNOSIS — Z712 Person consulting for explanation of examination or test findings: Secondary | ICD-10-CM | POA: Diagnosis not present

## 2020-09-12 NOTE — Patient Instructions (Addendum)
Medication Instructions:  Your Physician recommend you continue on your current medication as directed.    *If you need a refill on your cardiac medications before your next appointment, please call your pharmacy*   Lab Work: None   Testing/Procedures: None   Follow-Up: At [facility], you and your health needs are our priority.  As part of our continuing mission to provide you with exceptional heart care, we have created designated Provider Care Teams.  These Care Teams include your primary Cardiologist (physician) and Advanced Practice Providers (APPs -  Physician Assistants and Nurse Practitioners) who all work together to provide you with the care you need, when you need it.  We recommend signing up for the patient portal called "MyChart".  Sign up information is provided on this After Visit Summary.  MyChart is used to connect with patients for Virtual Visits (Telemedicine).  Patients are able to view lab/test results, encounter notes, upcoming appointments, etc.  Non-urgent messages can be sent to your provider as well.   To learn more about what you can do with MyChart, go to [email or web address].    Your next appointment:   1 year(s) @[address] [place], [place] [address]   The format for your next appointment:   In Person  Provider:   Buford Dresser, MD     [place] refers to food and lifestyle choices that are based on the traditions of countries located on the [place]. This way of eating has been shown to help prevent certain conditions and improve outcomes for people who have chronic diseases, like kidney disease and heart disease. What are tips for following this plan? Lifestyle  Cook and eat meals together with your family, when possible.  Drink enough fluid to keep your urine clear or pale yellow.  Be physically active every day. This includes: ? Aerobic exercise like running or  swimming. ? Leisure activities like gardening, walking, or housework.  Get 7-8 hours of sleep each night.  If recommended by your health care provider, drink red wine in moderation. This means 1 glass a day for nonpregnant women and 2 glasses a day for men. A glass of wine equals 5 oz (150 mL). Reading food labels  Check the serving size of packaged foods. For foods such as rice and pasta, the serving size refers to the amount of cooked product, not dry.  Check the total fat in packaged foods. Avoid foods that have saturated fat or trans fats.  Check the ingredients list for added sugars, such as corn syrup.   Shopping  At the grocery store, buy most of your food from the areas near the walls of the store. This includes: ? Fresh fruits and vegetables (produce). ? Grains, beans, nuts, and seeds. Some of these may be available in unpackaged forms or large amounts (in bulk). ? Fresh seafood. ? Poultry and eggs. ? Low-fat dairy products.  Buy whole ingredients instead of prepackaged foods.  Buy fresh fruits and vegetables in-season from local farmers markets.  Buy frozen fruits and vegetables in resealable bags.  If you do not have access to quality fresh seafood, buy precooked frozen shrimp or canned fish, such as tuna, salmon, or sardines.  Buy small amounts of raw or cooked vegetables, salads, or olives from the deli or salad bar at your store.  Stock your pantry so you always have certain foods on hand, such as olive oil, canned tuna, canned tomatoes, rice, pasta, and beans. Cooking  Cook foods with extra-virgin olive oil instead of using butter or other vegetable oils.  Have meat as a side dish, and have vegetables or grains as your main dish. This means having meat in small portions or adding small amounts of meat to foods like pasta or stew.  Use beans or vegetables instead of meat in common dishes like chili or lasagna.  Experiment with different cooking methods. Try  roasting or broiling vegetables instead of steaming or sauteing them.  Add frozen vegetables to soups, stews, pasta, or rice.  Add nuts or seeds for added healthy fat at each meal. You can add these to yogurt, salads, or vegetable dishes.  Marinate fish or vegetables using olive oil, lemon juice, garlic, and fresh herbs. Meal planning  Plan to eat 1 vegetarian meal one day each week. Try to work up to 2 vegetarian meals, if possible.  Eat seafood 2 or more times a week.  Have healthy snacks readily available, such as: ? Vegetable sticks with hummus. ? [place] yogurt. ? Fruit and nut trail mix.  Eat balanced meals throughout the week. This includes: ? Fruit: 2-3 servings a day ? Vegetables: 4-5 servings a day ? Low-fat dairy: 2 servings a day ? Fish, poultry, or lean meat: 1 serving a day ? Beans and legumes: 2 or more servings a week ? Nuts and seeds: 1-2 servings a day ? Whole grains: 6-8 servings a day ? Extra-virgin olive oil: 3-4 servings a day  Limit red meat and sweets to only a few servings a month   What are my food choices?  Mediterranean diet ? Recommended  Grains: Whole-grain pasta. Brown rice. Bulgar wheat. Polenta. Couscous. Whole-wheat bread. [place].  Vegetables: Artichokes. Beets. Broccoli. Cabbage. Carrots. Eggplant. Green beans. Chard. Kale. Spinach. Onions. Leeks. Peas. Squash. Tomatoes. Peppers. Radishes.  Fruits: Apples. Apricots. Avocado. Berries. Bananas. Cherries. Dates. Figs. Grapes. Lemons. Melon. Oranges. Peaches. Plums. Pomegranate.  Meats and other protein foods: Beans. Almonds. Sunflower seeds. Pine nuts. Peanuts. [place]. Salmon. Scallops. Shrimp. [place]. Tilapia. Clams. Oysters. Eggs.  Dairy: Low-fat milk. Cheese. Greek yogurt.  Beverages: Water. Red wine. Herbal tea.  Fats and oils: Extra virgin olive oil. Avocado oil. Grape seed oil.  Sweets and desserts: [place] yogurt with honey. Baked apples. Poached pears. Trail mix.  Seasoning  and other foods: Basil. Cilantro. Coriander. Cumin. Mint. Parsley. Sage. Rosemary. Tarragon. Garlic. Oregano. Thyme. Pepper. Balsalmic vinegar. Tahini. Hummus. Tomato sauce. Olives. Mushrooms. ? Limit these  Grains: Prepackaged pasta or rice dishes. Prepackaged cereal with added sugar.  Vegetables: Deep fried potatoes (french fries).  Fruits: Fruit canned in syrup.  Meats and other protein foods: Beef. Pork. Lamb. Poultry with skin. Hot dogs. [place] Salines.  Dairy: Ice cream. Sour cream. Whole milk.  Beverages: Juice. Sugar-sweetened soft drinks. Beer. Liquor and spirits.  Fats and oils: Butter. Canola oil. Vegetable oil. Beef fat (tallow). Lard.  Sweets and desserts: Cookies. Cakes. Pies. Candy.  Seasoning and other foods: Mayonnaise. Premade sauces and marinades. The items listed may not be a complete list. Talk with your dietitian about what dietary choices are right for you. Summary  The Mediterranean diet includes both food and lifestyle choices.  Eat a variety of fresh fruits and vegetables, beans, nuts, seeds, and whole grains.  Limit the amount of red meat and sweets that you eat.  Talk with your health care provider about whether it is safe for you to drink red wine in moderation. This means 1 glass a day for nonpregnant women and  2 glasses a day for men. A glass of wine equals 5 oz (150 mL). This information is not intended to replace advice given to you by your health care provider. Make sure you discuss any questions you have with your health care provider. Document Revised: 01/29/2016 Document Reviewed: 01/22/2016 Elsevier Patient Education  [place].

## 2020-09-12 NOTE — Progress Notes (Signed)
Cardiology Office Note:    Date:  09/12/2020   ID:  Kristy Becker, DOB 1951/02/16, MRN [id number]  PCP:  Billie Ruddy, MD  Cardiologist:  Buford Dresser, MD PhD  Referring MD: Billie Ruddy, MD   Chief complaint: palpitations  History of Present Illness:    Kristy Becker is a 70 y.o. female with a hx of hypertension who is seen for follow up today. I initially met her 01/27/2018 as a new consult at the request of Dr. Burnice Logan for the evaluation and management of palpitations.   Today: Reviewed results of monitor today. She noted that she feels an occasional beat/pause, notices most when she checks her BP. We reviewed her monitor, had 3% PVCs. Discussed this at length.   Husband has passed away, offered condolences. Has been dealing with glaucoma. Trying to get back to exercise, diet.   Denies chest pain, shortness of breath at rest or with normal exertion. No PND, orthopnea, LE edema or unexpected weight gain. No syncope or palpitations.  Past Medical History:  Diagnosis Date  . ALLERGIC RHINITIS 10/06/2007  . ANXIETY 01/31/2007  . BACK PAIN 08/21/2007  . COLONIC POLYPS, HX OF 02/26/2009  . ECZEMA, HANDS 05/08/2007  . Glaucoma   . HYPERTENSION 01/31/2007    Past Surgical History:  Procedure Laterality Date  . TONSILLECTOMY AND ADENOIDECTOMY      Current Medications: Current Outpatient Medications on File Prior to Visit  Medication Sig  . azelastine (ASTELIN) 0.1 % nasal spray 2 (two) times daily as needed  . benazepril-hydrochlorthiazide (LOTENSIN HCT) 20-12.5 MG tablet TAKE 1 TABLET BY MOUTH EVERY DAY  . Ca Phosphate-Cholecalciferol (CALTRATE GUMMY BITES PO) Take 1 tablet by mouth every morning.  . cetirizine (ZYRTEC) 10 MG tablet Take 10 mg by mouth daily.  . famotidine (PEPCID) 20 MG tablet Take 20 mg by mouth 2 (two) times daily. OTC  . MOMETASONE FUROATE NA Place 2 sprays into the nose daily. 50 mcg  . Multiple Vitamin (MULTIVITAMIN WITH MINERALS)  TABS Take 1 tablet by mouth daily.  . Multiple Vitamins-Minerals (CENTRUM SILVER 50+WOMEN) TABS Take by mouth daily. OTC  . Polyethyl Glycol-Propyl Glycol (SYSTANE) 0.4-0.3 % SOLN See admin instructions.   No current facility-administered medications on file prior to visit.     Allergies:   Azithromycin, Cephalosporins, Clarithromycin, Doxycycline hyclate, and Penicillins   Social History   Tobacco Use  . Smoking status: Never Smoker  . Smokeless tobacco: Never Used  Substance Use Topics  . Alcohol use: No  . Drug use: No    Family History: The patient's family is negative for cardiac disease. Mother had a CVA  ROS:   Please see the history of present illness.  Additional pertinent ROS otherwise unremarkable.  EKGs/Labs/Other Studies Reviewed:    The following studies were reviewed today: Monitor 01/2020 ~6 days of data recorded on Zio monitor. Patient had a min HR of 47 bpm, max HR of 127 bpm, and avg HR of 66 bpm. Predominant underlying rhythm was Sinus Rhythm. No VT, SVT, atrial fibrillation, high degree block, or pauses noted. Isolated atrial ectopy was rare (<1%), and isolated ventricular ectopy was occasional (3.4%). There were 0 triggered events. No significant arrhythmias detected.  Echo 12/2019 1. Left ventricular ejection fraction, by estimation, is 60 to 65%. The  left ventricle has normal function. The left ventricle has no regional  wall motion abnormalities. Left ventricular diastolic parameters are  consistent with Grade I diastolic  dysfunction (impaired relaxation). The  average left ventricular global  longitudinal strain is -19.5 %. The global longitudinal strain is normal.  2. Right ventricular systolic function is normal. The right ventricular  size is normal. There is normal pulmonary artery systolic pressure.  3. The mitral valve is normal in structure. Trivial mitral valve  regurgitation. No evidence of mitral stenosis.  4. The aortic valve is normal  in structure. Aortic valve regurgitation is  not visualized. No aortic stenosis is present.  5. The inferior vena cava is normal in size with greater than 50%  respiratory variability, suggesting right atrial pressure of 3 mmHg.   MPI 2008: normal  EKG:  EKG is ordered today.  The ekg ordered 01/02/20 demonstrates normal sinus rhythm  Recent Labs: 11/29/2019: BUN 17; Creatinine, Ser 0.74; Magnesium 2.1; Potassium 4.1; Pro B Natriuretic peptide (BNP) 25.0; Sodium 137 12/12/2019: Hemoglobin 13.5; Platelets 239.0; TSH 0.31  Recent Lipid Panel     Component Value Date/Time   CHOL 189 10/28/2015 0945   TRIG 64.0 10/28/2015 0945   HDL 56.10 10/28/2015 0945   CHOLHDL 3 10/28/2015 0945   VLDL 12.8 10/28/2015 0945   LDLCALC 120 (H) 10/28/2015 0945   LDLDIRECT 143.7 02/20/2010 0813    Physical Exam:    VS:  BP 120/70   Pulse 62   Ht $R'5\' 2"'CX$  (1.575 m)   Wt 165 lb 3.2 oz (74.9 kg)   SpO2 98%   BMI 30.22 kg/m     Wt Readings from Last 3 Encounters:  09/12/20 165 lb 3.2 oz (74.9 kg)  08/25/20 163 lb (73.9 kg)  01/02/20 159 lb (72.1 kg)    GEN: Well nourished, well developed in no acute distress HEENT: Normal, moist mucous membranes NECK: No JVD CARDIAC: regular rhythm, normal S1 and S2, no rubs or gallops. No murmur. VASCULAR: Radial and DP pulses 2+ bilaterally. No carotid bruits RESPIRATORY:  Clear to auscultation without rales, wheezing or rhonchi  ABDOMEN: Soft, non-tender, non-distended MUSCULOSKELETAL:  Ambulates independently SKIN: Warm and dry, no edema NEUROLOGIC:  Alert and oriented x 3. No focal neuro deficits noted. PSYCHIATRIC:  Normal affect   ASSESSMENT:    1. Irregular heart beat   2. Encounter to discuss test results   3. Essential hypertension   4. PVC (premature ventricular contraction)   5. Cardiac risk counseling   6. Counseling on health promotion and disease prevention    PLAN:    Occasional skipped beats, low burden PVCS: -we reviewed her monitor  results again. Low burden of PVCs. No high risk symptoms. Discussed options, will continue to monitor symptpms  Hypertension: -at goal today -continue benazepril-HCTZ  Primary cardiovascular prevention:  -recommend heart healthy/Mediterranean diet, with whole grains, fruits, vegetable, fish, lean meats, nuts, and olive oil. Limit salt. -recommend moderate walking, 3-5 times/week for 30-50 minutes each session. Aim for at least 150 minutes.week. Goal should be pace of 3 miles/hours, or walking 1.5 miles in 30 minutes -recommend avoidance of tobacco products. Avoid excess alcohol.  We discussed Mediterranean diet today. She will work on this, recheck lipids at follow up  Plan for follow up: 1 year or sooner PRN  Medication Adjustments/Labs and Tests Ordered: Current medicines are reviewed at length with the patient today.  Concerns regarding medicines are outlined above.  No orders of the defined types were placed in this encounter.  No orders of the defined types were placed in this encounter.   Patient Instructions   Medication Instructions:  Your Physician recommend you continue on your current  medication as directed.    *If you need a refill on your cardiac medications before your next appointment, please call your pharmacy*   Lab Work: None   Testing/Procedures: None   Follow-Up: At [facility], you and your health needs are our priority.  As part of our continuing mission to provide you with exceptional heart care, we have created designated Provider Care Teams.  These Care Teams include your primary Cardiologist (physician) and Advanced Practice Providers (APPs -  Physician Assistants and Nurse Practitioners) who all work together to provide you with the care you need, when you need it.  We recommend signing up for the patient portal called "MyChart".  Sign up information is provided on this After Visit Summary.  MyChart is used to connect with patients for Virtual  Visits (Telemedicine).  Patients are able to view lab/test results, encounter notes, upcoming appointments, etc.  Non-urgent messages can be sent to your provider as well.   To learn more about what you can do with MyChart, go to [email or web address].    Your next appointment:   1 year(s) $RemoveB'@[id number]$  [address] [place], [place] [address]   The format for your next appointment:   In Person  Provider:   Buford Dresser, MD     [place] refers to food and lifestyle choices that are based on the traditions of countries located on the [place]. This way of eating has been shown to help prevent certain conditions and improve outcomes for people who have chronic diseases, like kidney disease and heart disease. What are tips for following this plan? Lifestyle  Cook and eat meals together with your family, when possible.  Drink enough fluid to keep your urine clear or pale yellow.  Be physically active every day. This includes: ? Aerobic exercise like running or swimming. ? Leisure activities like gardening, walking, or housework.  Get 7-8 hours of sleep each night.  If recommended by your health care provider, drink red wine in moderation. This means 1 glass a day for nonpregnant women and 2 glasses a day for men. A glass of wine equals 5 oz (150 mL). Reading food labels  Check the serving size of packaged foods. For foods such as rice and pasta, the serving size refers to the amount of cooked product, not dry.  Check the total fat in packaged foods. Avoid foods that have saturated fat or trans fats.  Check the ingredients list for added sugars, such as corn syrup.   Shopping  At the grocery store, buy most of your food from the areas near the walls of the store. This includes: ? Fresh fruits and vegetables (produce). ? Grains, beans, nuts, and seeds. Some of these may be available in unpackaged forms or large amounts (in  bulk). ? Fresh seafood. ? Poultry and eggs. ? Low-fat dairy products.  Buy whole ingredients instead of prepackaged foods.  Buy fresh fruits and vegetables in-season from local farmers markets.  Buy frozen fruits and vegetables in resealable bags.  If you do not have access to quality fresh seafood, buy precooked frozen shrimp or canned fish, such as tuna, salmon, or sardines.  Buy small amounts of raw or cooked vegetables, salads, or olives from the deli or salad bar at your store.  Stock your pantry so you always have certain foods on hand, such as olive oil, canned tuna, canned tomatoes, rice, pasta, and beans. Cooking  Cook foods with extra-virgin olive oil instead of using butter  or other vegetable oils.  Have meat as a side dish, and have vegetables or grains as your main dish. This means having meat in small portions or adding small amounts of meat to foods like pasta or stew.  Use beans or vegetables instead of meat in common dishes like chili or lasagna.  Experiment with different cooking methods. Try roasting or broiling vegetables instead of steaming or sauteing them.  Add frozen vegetables to soups, stews, pasta, or rice.  Add nuts or seeds for added healthy fat at each meal. You can add these to yogurt, salads, or vegetable dishes.  Marinate fish or vegetables using olive oil, lemon juice, garlic, and fresh herbs. Meal planning  Plan to eat 1 vegetarian meal one day each week. Try to work up to 2 vegetarian meals, if possible.  Eat seafood 2 or more times a week.  Have healthy snacks readily available, such as: ? Vegetable sticks with hummus. ? [place] yogurt. ? Fruit and nut trail mix.  Eat balanced meals throughout the week. This includes: ? Fruit: 2-3 servings a day ? Vegetables: 4-5 servings a day ? Low-fat dairy: 2 servings a day ? Fish, poultry, or lean meat: 1 serving a day ? Beans and legumes: 2 or more servings a week ? Nuts and seeds: 1-2 servings  a day ? Whole grains: 6-8 servings a day ? Extra-virgin olive oil: 3-4 servings a day  Limit red meat and sweets to only a few servings a month   What are my food choices?  Mediterranean diet ? Recommended  Grains: Whole-grain pasta. Brown rice. Bulgar wheat. Polenta. Couscous. Whole-wheat bread. [place].  Vegetables: Artichokes. Beets. Broccoli. Cabbage. Carrots. Eggplant. Green beans. Chard. Kale. Spinach. Onions. Leeks. Peas. Squash. Tomatoes. Peppers. Radishes.  Fruits: Apples. Apricots. Avocado. Berries. Bananas. Cherries. Dates. Figs. Grapes. Lemons. Melon. Oranges. Peaches. Plums. Pomegranate.  Meats and other protein foods: Beans. Almonds. Sunflower seeds. Pine nuts. Peanuts. [place]. Salmon. Scallops. Shrimp. [place]. Tilapia. Clams. Oysters. Eggs.  Dairy: Low-fat milk. Cheese. Greek yogurt.  Beverages: Water. Red wine. Herbal tea.  Fats and oils: Extra virgin olive oil. Avocado oil. Grape seed oil.  Sweets and desserts: [place] yogurt with honey. Baked apples. Poached pears. Trail mix.  Seasoning and other foods: Basil. Cilantro. Coriander. Cumin. Mint. Parsley. Sage. Rosemary. Tarragon. Garlic. Oregano. Thyme. Pepper. Balsalmic vinegar. Tahini. Hummus. Tomato sauce. Olives. Mushrooms. ? Limit these  Grains: Prepackaged pasta or rice dishes. Prepackaged cereal with added sugar.  Vegetables: Deep fried potatoes (french fries).  Fruits: Fruit canned in syrup.  Meats and other protein foods: Beef. Pork. Lamb. Poultry with skin. Hot dogs. [place] Salines.  Dairy: Ice cream. Sour cream. Whole milk.  Beverages: Juice. Sugar-sweetened soft drinks. Beer. Liquor and spirits.  Fats and oils: Butter. Canola oil. Vegetable oil. Beef fat (tallow). Lard.  Sweets and desserts: Cookies. Cakes. Pies. Candy.  Seasoning and other foods: Mayonnaise. Premade sauces and marinades. The items listed may not be a complete list. Talk with your dietitian about what dietary choices are right for  you. Summary  The Mediterranean diet includes both food and lifestyle choices.  Eat a variety of fresh fruits and vegetables, beans, nuts, seeds, and whole grains.  Limit the amount of red meat and sweets that you eat.  Talk with your health care provider about whether it is safe for you to drink red wine in moderation. This means 1 glass a day for nonpregnant women and 2 glasses a day for men. A glass of wine  equals 5 oz (150 mL). This information is not intended to replace advice given to you by your health care provider. Make sure you discuss any questions you have with your health care provider. Document Revised: 01/29/2016 Document Reviewed: 01/22/2016 Elsevier Patient Education  2020 [employer].     Signed, Buford Dresser, MD PhD 09/12/2020  [facility]

## 2020-09-17 ENCOUNTER — Encounter: Payer: Self-pay | Admitting: Cardiology

## 2020-09-17 DIAGNOSIS — H409 Unspecified glaucoma: Secondary | ICD-10-CM | POA: Insufficient documentation

## 2020-09-17 DIAGNOSIS — Z01419 Encounter for gynecological examination (general) (routine) without abnormal findings: Secondary | ICD-10-CM | POA: Diagnosis not present

## 2020-09-17 DIAGNOSIS — Z1231 Encounter for screening mammogram for malignant neoplasm of breast: Secondary | ICD-10-CM | POA: Diagnosis not present

## 2020-09-17 LAB — HM MAMMOGRAPHY: HM Mammogram: NORMAL (ref 0–4)

## 2020-09-25 DIAGNOSIS — H401133 Primary open-angle glaucoma, bilateral, severe stage: Secondary | ICD-10-CM | POA: Diagnosis not present

## 2020-09-26 ENCOUNTER — Other Ambulatory Visit: Payer: Self-pay | Admitting: Family Medicine

## 2020-09-29 ENCOUNTER — Ambulatory Visit: Payer: Medicare PPO | Admitting: Family Medicine

## 2020-10-03 ENCOUNTER — Telehealth: Payer: Self-pay | Admitting: Cardiology

## 2020-10-03 NOTE — Telephone Encounter (Signed)
Patient would like for heart medical records to be sent over to Dr. Grier Mitts office at [facility].

## 2020-10-03 NOTE — Telephone Encounter (Signed)
Returned call to patient left message on personal voice mail Dr.Banks is in epic she will be able to view your records.

## 2020-10-09 ENCOUNTER — Other Ambulatory Visit: Payer: Self-pay

## 2020-10-10 ENCOUNTER — Encounter: Payer: Self-pay | Admitting: Family Medicine

## 2020-10-10 ENCOUNTER — Ambulatory Visit: Payer: Medicare PPO | Admitting: Family Medicine

## 2020-10-10 VITALS — BP 132/72 | HR 82 | Temp 98.4°F | Wt 162.2 lb

## 2020-10-10 DIAGNOSIS — Z8781 Personal history of (healed) traumatic fracture: Secondary | ICD-10-CM | POA: Diagnosis not present

## 2020-10-10 DIAGNOSIS — M25471 Effusion, right ankle: Secondary | ICD-10-CM

## 2020-10-10 DIAGNOSIS — I10 Essential (primary) hypertension: Secondary | ICD-10-CM

## 2020-10-10 NOTE — Progress Notes (Signed)
Subjective:    Patient ID: Kristy Becker, female    DOB: [birth year], 70 y.o.   MRN: [id number]  Chief Complaint  Patient presents with  . Follow-up    BP follow up  . Edema    Rt ankle    HPI Patient was seen today for f/u on HTN and LE edema.  Pt state BP improving as she is working on anxiety.  In counseling. Taking benazepril-hydrochlorothiazide.  Checking bp at home.  Notes right ankle edema, occurs in evening, down in am.  Endorses h/o R ankle fx.  Pt decreasing sodium intake.  Past Medical History:  Diagnosis Date  . ALLERGIC RHINITIS 10/06/2007  . ANXIETY 01/31/2007  . BACK PAIN 08/21/2007  . COLONIC POLYPS, HX OF 02/26/2009  . ECZEMA, HANDS 05/08/2007  . Glaucoma   . HYPERTENSION 01/31/2007    Allergies  Allergen Reactions  . Azithromycin     nausea  . Cephalosporins Other (See Comments)    unknown  . Clarithromycin Other (See Comments)    unknown  . Doxycycline Hyclate Other (See Comments)  . Penicillins Hives    ROS General: Denies fever, chills, night sweats, changes in weight, changes in appetite HEENT: Denies headaches, ear pain, changes in vision, rhinorrhea, sore throat CV: Denies CP, palpitations, SOB, orthopnea Pulm: Denies SOB, cough, wheezing GI: Denies abdominal pain, nausea, vomiting, diarrhea, constipation GU: Denies dysuria, hematuria, frequency, vaginal discharge Msk: Denies muscle cramps, joint pains  +R ankle edema Neuro: Denies weakness, numbness, tingling Skin: Denies rashes, bruising Psych: Denies depression, anxiety, hallucinations    Objective:    Blood pressure 132/72, pulse 82, temperature 98.4 F (36.9 C), temperature source Oral, weight 162 lb 3.2 oz (73.6 kg), SpO2 98 %.  Gen. Pleasant, well-nourished, in no distress, normal affect   HEENT: [place]/AT, face symmetric, conjunctiva clear, no scleral icterus, PERRLA, EOMI, nares patent without drainage Lungs: no accessory muscle use, CTAB, no wheezes or rales Cardiovascular: RRR, no  m/r/g, no peripheral edema Musculoskeletal: No deformities, no cyanosis or clubbing, normal tone Neuro:  A&Ox3, CN II-XII intact, normal gait Skin:  Warm, no lesions/ rash  Wt Readings from Last 3 Encounters:  10/10/20 162 lb 3.2 oz (73.6 kg)  09/12/20 165 lb 3.2 oz (74.9 kg)  08/25/20 163 lb (73.9 kg)    Lab Results  Component Value Date   WBC 6.0 12/12/2019   HGB 13.5 12/12/2019   HCT 39.6 12/12/2019   PLT 239.0 12/12/2019   GLUCOSE 86 11/29/2019   CHOL 189 10/28/2015   TRIG 64.0 10/28/2015   HDL 56.10 10/28/2015   LDLDIRECT 143.7 02/20/2010   LDLCALC 120 (H) 10/28/2015   ALT 23 04/08/2016   AST 16 04/08/2016   NA 137 11/29/2019   K 4.1 11/29/2019   CL 102 11/29/2019   CREATININE 0.74 11/29/2019   BUN 17 11/29/2019   CO2 28 11/29/2019   TSH 0.31 (L) 12/12/2019   INR 1.01 09/29/2009    Assessment/Plan:  Essential hypertension -controlled.   -anxiety contributes to elevation. -continue lifestyle modifications -continue benazepril-hctz 20-12.5 mg   Edema of right ankle -likely 2/2 h/o fx -supportive care including elevation, decreasing sodium intake, compression -continue benazepril-HCTZ  History of fracture of right ankle -stable  F/u prn in 3-4 months  Grier Mitts, MD

## 2020-10-25 ENCOUNTER — Encounter: Payer: Self-pay | Admitting: Family Medicine

## 2020-11-04 DIAGNOSIS — H04123 Dry eye syndrome of bilateral lacrimal glands: Secondary | ICD-10-CM | POA: Diagnosis not present

## 2020-11-04 DIAGNOSIS — H401133 Primary open-angle glaucoma, bilateral, severe stage: Secondary | ICD-10-CM | POA: Diagnosis not present

## 2020-11-21 ENCOUNTER — Other Ambulatory Visit: Payer: Self-pay | Admitting: Family Medicine

## 2020-12-11 ENCOUNTER — Other Ambulatory Visit: Payer: Self-pay | Admitting: Gastroenterology

## 2020-12-11 DIAGNOSIS — R1084 Generalized abdominal pain: Secondary | ICD-10-CM | POA: Diagnosis not present

## 2020-12-11 DIAGNOSIS — R1032 Left lower quadrant pain: Secondary | ICD-10-CM

## 2020-12-11 DIAGNOSIS — R140 Abdominal distension (gaseous): Secondary | ICD-10-CM

## 2020-12-11 DIAGNOSIS — R143 Flatulence: Secondary | ICD-10-CM | POA: Diagnosis not present

## 2020-12-12 DIAGNOSIS — J3081 Allergic rhinitis due to animal (cat) (dog) hair and dander: Secondary | ICD-10-CM | POA: Diagnosis not present

## 2020-12-12 DIAGNOSIS — J301 Allergic rhinitis due to pollen: Secondary | ICD-10-CM | POA: Diagnosis not present

## 2020-12-12 DIAGNOSIS — J3089 Other allergic rhinitis: Secondary | ICD-10-CM | POA: Diagnosis not present

## 2020-12-12 DIAGNOSIS — H1045 Other chronic allergic conjunctivitis: Secondary | ICD-10-CM | POA: Diagnosis not present

## 2020-12-29 DIAGNOSIS — Z9889 Other specified postprocedural states: Secondary | ICD-10-CM | POA: Diagnosis not present

## 2020-12-29 DIAGNOSIS — H401233 Low-tension glaucoma, bilateral, severe stage: Secondary | ICD-10-CM | POA: Diagnosis not present

## 2020-12-29 DIAGNOSIS — H47213 Primary optic atrophy, bilateral: Secondary | ICD-10-CM | POA: Diagnosis not present

## 2020-12-29 DIAGNOSIS — Z961 Presence of intraocular lens: Secondary | ICD-10-CM | POA: Diagnosis not present

## 2020-12-30 ENCOUNTER — Other Ambulatory Visit: Payer: Self-pay

## 2020-12-30 ENCOUNTER — Ambulatory Visit
Admission: RE | Admit: 2020-12-30 | Discharge: 2020-12-30 | Disposition: A | Discharge status: Home / Self Care | Payer: Medicare PPO | Source: Ambulatory Visit | Attending: Gastroenterology | Admitting: Gastroenterology

## 2020-12-30 DIAGNOSIS — N858 Other specified noninflammatory disorders of uterus: Secondary | ICD-10-CM | POA: Diagnosis not present

## 2020-12-30 DIAGNOSIS — R140 Abdominal distension (gaseous): Secondary | ICD-10-CM

## 2020-12-30 DIAGNOSIS — R1084 Generalized abdominal pain: Secondary | ICD-10-CM | POA: Diagnosis not present

## 2020-12-30 DIAGNOSIS — R1032 Left lower quadrant pain: Secondary | ICD-10-CM

## 2020-12-30 DIAGNOSIS — D259 Leiomyoma of uterus, unspecified: Secondary | ICD-10-CM | POA: Diagnosis not present

## 2021-01-01 ENCOUNTER — Telehealth: Payer: Self-pay | Admitting: Family Medicine

## 2021-01-01 NOTE — Telephone Encounter (Signed)
Left message for patient to call back and schedule Medicare Annual Wellness Visit (AWV) either virtually or in office.   Last AWV 01/09/20 please schedule at anytime with [facility] Nurse Health Advisor 1 or 2   This should be a 45 minute visit.

## 2021-01-01 NOTE — Telephone Encounter (Signed)
Patient will check her calendar and call back to schedule.

## 2021-01-30 DIAGNOSIS — R9389 Abnormal findings on diagnostic imaging of other specified body structures: Secondary | ICD-10-CM | POA: Diagnosis not present

## 2021-02-03 ENCOUNTER — Encounter: Payer: Self-pay | Admitting: Family Medicine

## 2021-02-04 ENCOUNTER — Telehealth: Payer: Self-pay | Admitting: Family Medicine

## 2021-02-04 NOTE — Telephone Encounter (Signed)
Left message for patient to call back and schedule Medicare Annual Wellness Visit (AWV) either virtually or in office.   Left  my Kristy Becker number [phone]   Last AWV 01/09/20  please schedule at anytime with [facility] Nurse Health Advisor 1 or 2   This should be a 45 minute visit.

## 2021-02-12 DIAGNOSIS — H401133 Primary open-angle glaucoma, bilateral, severe stage: Secondary | ICD-10-CM | POA: Diagnosis not present

## 2021-02-23 ENCOUNTER — Ambulatory Visit (INDEPENDENT_AMBULATORY_CARE_PROVIDER_SITE_OTHER): Payer: Medicare PPO | Admitting: Family Medicine

## 2021-02-23 ENCOUNTER — Encounter: Payer: Self-pay | Admitting: Family Medicine

## 2021-02-23 ENCOUNTER — Other Ambulatory Visit: Payer: Self-pay

## 2021-02-23 VITALS — BP 128/68 | HR 88 | Temp 98.3°F | Wt 161.4 lb

## 2021-02-23 DIAGNOSIS — M79605 Pain in left leg: Secondary | ICD-10-CM | POA: Diagnosis not present

## 2021-02-23 DIAGNOSIS — M5432 Sciatica, left side: Secondary | ICD-10-CM

## 2021-02-23 DIAGNOSIS — Z23 Encounter for immunization: Secondary | ICD-10-CM | POA: Diagnosis not present

## 2021-02-23 NOTE — Patient Instructions (Signed)
Let me know if your pain continues or becomes worse.

## 2021-02-23 NOTE — Progress Notes (Signed)
Subjective:    Patient ID: Kristy Becker, female    DOB: 09/04/1950, 70 y.o.   MRN: [id number]  Chief Complaint  Patient presents with   Leg Injury    Was cleaning out the barn a few weeks ago and started having a pain in lower left leg, has now moved and is feeling it up the whole leg. Heat pad for pain, walking also helps    HPI Patient was seen today for ongoing concern.  Pt with L lateral lower leg pain since Mid August after cleaning the barn.  Pt was wearing older clogs and flat shoes at the time.  Pt states the pain is like an intermittent tooth ache that is now also in posterior left thigh.  Leg is tender to touch in those areas.  Patient tried heat and walking which helped.  Patient denies loss of bowel or bladder, low back pain, calf pain, lower leg edema.  Patient interested in influenza vaccine and shingles vaccine.  Past Medical History:  Diagnosis Date   ALLERGIC RHINITIS 10/06/2007   ANXIETY 01/31/2007   BACK PAIN 08/21/2007   COLONIC POLYPS, HX OF 02/26/2009   ECZEMA, HANDS 05/08/2007   Glaucoma    HYPERTENSION 01/31/2007    Allergies  Allergen Reactions   Azithromycin     nausea   Cephalosporins Other (See Comments)    unknown   Clarithromycin Other (See Comments)    unknown   Doxycycline Hyclate Other (See Comments)   Penicillins Hives    ROS General: Denies fever, chills, night sweats, changes in weight, changes in appetite HEENT: Denies headaches, ear pain, changes in vision, rhinorrhea, sore throat CV: Denies CP, palpitations, SOB, orthopnea Pulm: Denies SOB, cough, wheezing GI: Denies abdominal pain, nausea, vomiting, diarrhea, constipation GU: Denies dysuria, hematuria, frequency, vaginal discharge Msk: Denies muscle cramps, joint pains  +L posterior leg pain and left lateral lower leg pain Neuro: Denies weakness, numbness, tingling Skin: Denies rashes, bruising Psych: Denies depression, anxiety, hallucinations    Objective:    Blood pressure  128/68, pulse 88, temperature 98.3 F (36.8 C), temperature source Oral, weight 161 lb 6.4 oz (73.2 kg), SpO2 97 %.  Gen. Pleasant, well-nourished, in no distress, normal affect   HEENT: [place]/AT, face symmetric, conjunctiva clear, no scleral icterus, PERRLA, EOMI, nares patent without drainage, Lungs: no accessory muscle use Cardiovascular: RRR, no peripheral edema.  No LE edema, negative Homans' sign. Musculoskeletal: No TTP of spine or paraspinal muscles.  TTP of posterior L thigh midline and lateral L calf.  No TTP with compression of the left or right calf.  Bilateral LEs equal in size.  No deformities, no cyanosis or clubbing, normal tone Neuro:  A&Ox3, CN II-XII intact, normal gait Skin:  Warm, dry, intact, no lesions/ rash   Wt Readings from Last 3 Encounters:  02/23/21 161 lb 6.4 oz (73.2 kg)  10/10/20 162 lb 3.2 oz (73.6 kg)  09/12/20 165 lb 3.2 oz (74.9 kg)    Lab Results  Component Value Date   WBC 6.0 12/12/2019   HGB 13.5 12/12/2019   HCT 39.6 12/12/2019   PLT 239.0 12/12/2019   GLUCOSE 86 11/29/2019   CHOL 189 10/28/2015   TRIG 64.0 10/28/2015   HDL 56.10 10/28/2015   LDLDIRECT 143.7 02/20/2010   LDLCALC 120 (H) 10/28/2015   ALT 23 04/08/2016   AST 16 04/08/2016   NA 137 11/29/2019   K 4.1 11/29/2019   CL 102 11/29/2019   CREATININE 0.74 11/29/2019  BUN 17 11/29/2019   CO2 28 11/29/2019   TSH 0.31 (L) 12/12/2019   INR 1.01 09/29/2009    Assessment/Plan:  Left leg pain -Discussed supportive care including topical analgesics, Tylenol, or ibuprofen as needed.  Ice, heat, stretching, massage. -Given precautions  Sciatica of left side -Improving -Stretching exercises and other supportive care given -Given handouts -For continued or worsening symptoms consider muscle relaxer or physical therapy  Need for influenza vaccination - Plan: Flu Vaccine QUAD High Dose(Fluad)  F/u as needed  Grier Mitts, MD

## 2021-02-24 DIAGNOSIS — R140 Abdominal distension (gaseous): Secondary | ICD-10-CM | POA: Diagnosis not present

## 2021-02-24 DIAGNOSIS — N9489 Other specified conditions associated with female genital organs and menstrual cycle: Secondary | ICD-10-CM | POA: Diagnosis not present

## 2021-03-11 DIAGNOSIS — N9489 Other specified conditions associated with female genital organs and menstrual cycle: Secondary | ICD-10-CM | POA: Diagnosis not present

## 2021-03-16 ENCOUNTER — Telehealth: Payer: Self-pay | Admitting: Family Medicine

## 2021-03-16 ENCOUNTER — Encounter: Payer: Self-pay | Admitting: Family Medicine

## 2021-03-16 ENCOUNTER — Other Ambulatory Visit: Payer: Self-pay

## 2021-03-16 ENCOUNTER — Ambulatory Visit (INDEPENDENT_AMBULATORY_CARE_PROVIDER_SITE_OTHER): Payer: Medicare PPO | Admitting: Family Medicine

## 2021-03-16 VITALS — BP 124/76 | HR 83 | Temp 98.1°F | Wt 162.2 lb

## 2021-03-16 DIAGNOSIS — D219 Benign neoplasm of connective and other soft tissue, unspecified: Secondary | ICD-10-CM | POA: Diagnosis not present

## 2021-03-16 DIAGNOSIS — H401133 Primary open-angle glaucoma, bilateral, severe stage: Secondary | ICD-10-CM

## 2021-03-16 DIAGNOSIS — G2581 Restless legs syndrome: Secondary | ICD-10-CM

## 2021-03-16 DIAGNOSIS — Z0000 Encounter for general adult medical examination without abnormal findings: Secondary | ICD-10-CM | POA: Diagnosis not present

## 2021-03-16 DIAGNOSIS — F411 Generalized anxiety disorder: Secondary | ICD-10-CM

## 2021-03-16 DIAGNOSIS — Z780 Asymptomatic menopausal state: Secondary | ICD-10-CM | POA: Diagnosis not present

## 2021-03-16 DIAGNOSIS — I10 Essential (primary) hypertension: Secondary | ICD-10-CM

## 2021-03-16 LAB — CBC WITH DIFFERENTIAL/PLATELET
Basophils Absolute: 0 10*3/uL (ref 0.0–0.1)
Basophils Relative: 0.6 % (ref 0.0–3.0)
Eosinophils Absolute: 0 10*3/uL (ref 0.0–0.7)
Eosinophils Relative: 1 % (ref 0.0–5.0)
HCT: 41 % (ref 36.0–46.0)
Hemoglobin: 13.7 g/dL (ref 12.0–15.0)
Lymphocytes Relative: 31.3 % (ref 12.0–46.0)
Lymphs Abs: 1.5 10*3/uL (ref 0.7–4.0)
MCHC: 33.4 g/dL (ref 30.0–36.0)
MCV: 90 fl (ref 78.0–100.0)
Monocytes Absolute: 0.5 10*3/uL (ref 0.1–1.0)
Monocytes Relative: 10.5 % (ref 3.0–12.0)
Neutro Abs: 2.7 10*3/uL (ref 1.4–7.7)
Neutrophils Relative %: 56.6 % (ref 43.0–77.0)
Platelets: 250 10*3/uL (ref 150.0–400.0)
RBC: 4.55 Mil/uL (ref 3.87–5.11)
RDW: 13.3 % (ref 11.5–15.5)
WBC: 4.7 10*3/uL (ref 4.0–10.5)

## 2021-03-16 LAB — VITAMIN D 25 HYDROXY (VIT D DEFICIENCY, FRACTURES): VITD: 32.22 ng/mL (ref 30.00–100.00)

## 2021-03-16 LAB — BASIC METABOLIC PANEL
BUN: 17 mg/dL (ref 6–23)
CO2: 27 mEq/L (ref 19–32)
Calcium: 9.6 mg/dL (ref 8.4–10.5)
Chloride: 103 mEq/L (ref 96–112)
Creatinine, Ser: 0.8 mg/dL (ref 0.40–1.20)
GFR: 74.11 mL/min (ref >60.00)
Glucose, Bld: 93 mg/dL (ref 70–99)
Potassium: 3.8 mEq/L (ref 3.5–5.1)
Sodium: 139 mEq/L (ref 135–145)

## 2021-03-16 LAB — LIPID PANEL
Cholesterol: 181 mg/dL (ref 0–200)
HDL: 61.1 mg/dL (ref >39.00)
LDL Cholesterol: 104 mg/dL — ABNORMAL HIGH (ref 0–99)
NonHDL: 120.22
Total CHOL/HDL Ratio: 3
Triglycerides: 80 mg/dL (ref 0.0–149.0)
VLDL: 16 mg/dL (ref 0.0–40.0)

## 2021-03-16 LAB — MAGNESIUM: Magnesium: 1.8 mg/dL (ref 1.5–2.5)

## 2021-03-16 LAB — TSH: TSH: 0.3 u[IU]/mL — ABNORMAL LOW (ref 0.35–5.50)

## 2021-03-16 LAB — VITAMIN B12: Vitamin B-12: 566 pg/mL (ref 211–911)

## 2021-03-16 LAB — T4, FREE: Free T4: 0.86 ng/dL (ref 0.60–1.60)

## 2021-03-16 LAB — HEMOGLOBIN A1C: Hgb A1c MFr Bld: 5.7 % (ref 4.6–6.5)

## 2021-03-16 NOTE — Telephone Encounter (Signed)
COVID vaccine dates updated.

## 2021-03-16 NOTE — Telephone Encounter (Signed)
Patient called in to give the dates for her COVID vaccine shots.  1st shot pfizer el 926- 07/04/2019 2nd shot pfizer el 9263- 07/25/2019 1st booster pfizer [id number]- 04/15/2020 2nd booster pfizer [id number]- 01/21/2021  Patient could be contacted at [phone].  Please advise.

## 2021-03-16 NOTE — Progress Notes (Signed)
Subjective:   Kristy Becker is a 69 y.o. female who presents for Medicare Annual (Subsequent) preventive examination.  Patient states she was doing well overall.  Endorses decreased pain in the left leg as noted during previous visit.  Patient now has intermittent sharp pain in left lateral leg.  Notes mostly at night.  Also endorses the urge to move her legs at night to get comfortable.  Patient denies swelling in bilateral LEs.  Endorses doing stretching exercises which have seemed to help.  Patient endorses feeling sad/anxious about having glaucoma, as she does not want to lose her sight.  Also endorses recently been told she has fibroids.  Also notes that there was a concern about a possible mass on her uterus that was not a fibroid.  Patient has a follow-up ultrasound with Dr. Landry Mellow.  Patient denies bleeding, fatigue, pelvic fullness.  Anxiety is stable.  Patient endorses having a living will/healthcare power of attorney.  Review of Systems    +L lateral L lower leg discomfort and need to move legs at night.   Objective:    Today's Vitals   03/16/21 1002  BP: 124/76  Pulse: 83  Temp: 98.1 F (36.7 C)  TempSrc: Oral  SpO2: 95%  Weight: 162 lb 3.2 oz (73.6 kg)   Body mass index is 29.67 kg/m.  Gen. Pleasant, well developed, well-nourished, in NAD HEENT - [place]/AT, PERRL, EOMI, conjunctive clear, no scleral icterus, no nasal drainage, pharynx without erythema or exudate. Neck: No JVD, no thyromegaly, no carotid bruits Lungs: no use of accessory muscles, CTAB, no wheezes, rales or rhonchi Cardiovascular: RRR,  No r/g/m, no peripheral edema Abdomen: BS present, soft, nontender,nondistended, no hepatosplenomegaly Musculoskeletal: No deformities, moves all four extremities, no cyanosis or clubbing, normal tone Neuro:  A&Ox3, CN II-XII intact, normal gait Skin:  Warm, dry, intact, no lesions.  Fingernails with partially present gray nail polish  Psych: normal affect, mood  appropriate    Advanced Directives 01/09/2020 12/07/2016  Does Patient Have a Medical Advance Directive? Yes No  Type of Paramedic of [place];Living will -  Does patient want to make changes to medical advance directive? No - Patient declined -  Copy of [place] in Chart? No - copy requested -  Would patient like information on creating a medical advance directive? - No - Patient declined    Current Medications (verified) Outpatient Encounter Medications as of 03/16/2021  Medication Sig   azelastine (ASTELIN) 0.1 % nasal spray 2 (two) times daily as needed   benazepril-hydrochlorthiazide (LOTENSIN HCT) 20-12.5 MG tablet TAKE 1 TABLET BY MOUTH EVERY DAY   cetirizine (ZYRTEC) 10 MG tablet Take 10 mg by mouth daily.   famotidine (PEPCID) 20 MG tablet Take 20 mg by mouth 2 (two) times daily. OTC   MOMETASONE FUROATE NA Place 2 sprays into the nose daily. 50 mcg   Multiple Vitamins-Minerals (CENTRUM SILVER 50+WOMEN) TABS Take by mouth daily. OTC   Polyethyl Glycol-Propyl Glycol (SYSTANE) 0.4-0.3 % SOLN See admin instructions.   Probiotic Product (PROBIOTIC DAILY PO) Take by mouth.   Ca Phosphate-Cholecalciferol (CALTRATE GUMMY BITES PO) Take 1 tablet by mouth every morning. (Patient not taking: No sig reported)   Multiple Vitamin (MULTIVITAMIN WITH MINERALS) TABS Take 1 tablet by mouth daily. (Patient not taking: No sig reported)   No facility-administered encounter medications on file as of 03/16/2021.    Allergies (verified) Azithromycin, Cephalosporins, Clarithromycin, Doxycycline hyclate, and Penicillins   History: Past Medical  History:  Diagnosis Date   ALLERGIC RHINITIS 10/06/2007   ANXIETY 01/31/2007   BACK PAIN 08/21/2007   COLONIC POLYPS, HX OF 02/26/2009   ECZEMA, HANDS 05/08/2007   Glaucoma    HYPERTENSION 01/31/2007   Past Surgical History:  Procedure Laterality Date   TONSILLECTOMY AND ADENOIDECTOMY     History reviewed. No  pertinent family history. Social History   Socioeconomic History   Marital status: Married    Spouse name: Not on file   Number of children: Not on file   Years of education: Not on file   Highest education level: Not on file  Occupational History   Not on file  Tobacco Use   Smoking status: Never   Smokeless tobacco: Never  Substance and Sexual Activity   Alcohol use: No   Drug use: No   Sexual activity: Not on file  Other Topics Concern   Not on file  Social History Narrative   Not on file   Social Determinants of Health   Financial Resource Strain: Not on file  Food Insecurity: Not on file  Transportation Needs: Not on file  Physical Activity: Not on file  Stress: Not on file  Social Connections: Not on file    Tobacco Counseling Counseling given: Not Answered   Activities of Daily Living No flowsheet data found.  Patient Care Team: Billie Ruddy, MD as PCP - General (Family Medicine) Buford Dresser, MD as PCP - Cardiology (Cardiology)  Indicate any recent Medical Services you may have received from other than [facility] providers in the past year (date may be approximate).     Assessment:   This is a routine wellness examination for Kristy Becker.  Hearing/Vision screen No results found.  Dietary issues and exercise activities discussed:     Goals Addressed   None    Depression Screen PHQ 2/9 Scores 03/16/2021 08/25/2020 01/09/2020 04/26/2019 11/30/2016 11/04/2015 04/11/2015  PHQ - 2 Score 1 4 1 2  0 1 2  PHQ- 9 Score 1 8 1 2  - - -    Fall Risk Fall Risk  03/16/2021 01/09/2020 11/30/2016 11/04/2015 04/11/2015  Falls in the past year? 0 0 No No No  Number falls in past yr: - 0 - - -  Injury with Fall? - 0 - - -    FALL RISK PREVENTION PERTAINING TO THE HOME:  Any stairs in or around the home? No  If so, are there any without handrails? N/a  Home free of loose throw rugs in walkways, pet beds, electrical cords, etc? Yes  Adequate lighting in your  home to reduce risk of falls? Yes   ASSISTIVE DEVICES UTILIZED TO PREVENT FALLS:  Life alert? No  Use of a cane, walker or w/c? No  Grab bars in the bathroom? No  Shower chair or bench in shower? No  Elevated toilet seat or a handicapped toilet? No   TIMED UP AND GO:  Was the test performed? No .  Ambulates without difficulty or use of assistive device.  Gait steady and fast without use of assistive device  Cognitive Function: A&O x3   6CIT Screen 01/09/2020  What Year? 0 points  What month? 0 points  What time? 0 points  Count back from 20 0 points  Months in reverse 0 points  Repeat phrase 4 points  Total Score 4    Immunizations Immunization History  Administered Date(s) Administered   Fluad Quad(high Dose 65+) 04/26/2019, 02/23/2021   Influenza, High Dose Seasonal PF 04/11/2015, 04/15/2016,  08/31/2016, 03/31/2017, 08/30/2017, 05/01/2018, 06/28/2019   Influenza,inj,Quad PF,6+ Mos 04/06/2013, 04/08/2014   Influenza-Unspecified 03/14/2016   PFIZER(Purple Top)SARS-COV-2 Vaccination 12/12/2020   Pneumococcal Conjugate-13 05/28/2015   Pneumococcal Polysaccharide-23 06/02/2016, 08/31/2016, 08/30/2017, 04/19/2018, 04/24/2019, 06/28/2019   Td 09/12/2009    TDAP status: Due, Education has been provided regarding the importance of this vaccine. Advised may receive this vaccine at local pharmacy or Health Dept. Aware to provide a copy of the vaccination record if obtained from local pharmacy or Health Dept. Verbalized acceptance and understanding.  Flu Vaccine status: Up to date  Pneumococcal vaccine status: Up to date  Covid-19 vaccine status: Completed vaccines  Qualifies for Shingles Vaccine? Yes   Zostavax completed No   Shingrix Completed?: No.    Education has been provided regarding the importance of this vaccine. Patient has been advised to call insurance company to determine out of pocket expense if they have not yet received this vaccine. Advised may also receive  vaccine at local pharmacy or Health Dept. Verbalized acceptance and understanding.  Screening Tests Health Maintenance  Topic Date Due   Hepatitis C Screening  Never done   Zoster Vaccines- Shingrix (1 of 2) Never done   TETANUS/TDAP  09/13/2019   COVID-19 Vaccine (2 - Pfizer series) 01/02/2021   MAMMOGRAM  09/17/2021   COLONOSCOPY (Pts 45-45yrs Insurance coverage will need to be confirmed)  01/14/2027   INFLUENZA VACCINE  Completed   DEXA SCAN  Completed   HPV VACCINES  Aged Out    Health Maintenance  Health Maintenance Due  Topic Date Due   Hepatitis C Screening  Never done   Zoster Vaccines- Shingrix (1 of 2) Never done   TETANUS/TDAP  09/13/2019   COVID-19 Vaccine (2 - Pfizer series) 01/02/2021    Colorectal cancer screening: Type of screening: Colonoscopy. Completed 01/13/2017.   Mammogram status: Completed 09/17/2020. Repeat every year  Bone Density status: Ordered 03/16/2021. Pt provided with contact info and advised to call to schedule appt.  Lung Cancer Screening: (Low Dose CT Chest recommended if Age 49-80 years, 30 pack-year currently smoking OR have quit w/in 15years.) does not qualify.   Lung Cancer Screening Referral: n/a  Additional Screening:  Hepatitis C Screening: does qualify  Vision Screening: Recommended annual ophthalmology exams for early detection of glaucoma and other disorders of the eye. Is the patient up to date with their annual eye exam?  Yes   Dental Screening: Recommended annual dental exams for proper oral hygiene    Plan:    Essential hypertension -Controlled -Continue current medications including benazepril-hydrochlorothiazide 20-12.5 mg -Continue lifestyle modifications - Plan: Basic metabolic panel, Hemoglobin A1c, Lipid panel  GAD (generalized anxiety disorder) -Stable - Plan: TSH, T4, Free  Primary open angle glaucoma (POAG) of both eyes, severe stage -Continue current medications -Continue follow-up with  ophthalmology  Medicare annual wellness visit, subsequent  RLS (restless legs syndrome) -Given handout - Plan: CBC with Differential/Platelet, Basic metabolic panel, Magnesium, Vitamin B12, Vitamin D, 25-hydroxy  Fibroids -Continue follow-up with OB/GYN  Asymptomatic menopausal state  - Plan: DG Bone Density   I have personally reviewed and noted the following in the patient's chart:   Medical and social history Use of alcohol, tobacco or illicit drugs  Current medications and supplements including opioid prescriptions.  Functional ability and status Nutritional status Physical activity Advanced directives List of other physicians Hospitalizations, surgeries, and ER visits in previous 12 months Vitals Screenings to include cognitive, depression, and falls Referrals and appointments  In addition, I have  reviewed and discussed with patient certain preventive protocols, quality metrics, and best practice recommendations. A written personalized care plan for preventive services as well as general preventive health recommendations were provided to patient.     Billie Ruddy, MD   03/16/2021

## 2021-03-17 DIAGNOSIS — E669 Obesity, unspecified: Secondary | ICD-10-CM | POA: Diagnosis not present

## 2021-03-17 DIAGNOSIS — F411 Generalized anxiety disorder: Secondary | ICD-10-CM | POA: Diagnosis not present

## 2021-03-17 DIAGNOSIS — Z823 Family history of stroke: Secondary | ICD-10-CM | POA: Diagnosis not present

## 2021-03-17 DIAGNOSIS — I10 Essential (primary) hypertension: Secondary | ICD-10-CM | POA: Diagnosis not present

## 2021-03-17 DIAGNOSIS — Z809 Family history of malignant neoplasm, unspecified: Secondary | ICD-10-CM | POA: Diagnosis not present

## 2021-03-17 DIAGNOSIS — J301 Allergic rhinitis due to pollen: Secondary | ICD-10-CM | POA: Diagnosis not present

## 2021-03-17 DIAGNOSIS — H04129 Dry eye syndrome of unspecified lacrimal gland: Secondary | ICD-10-CM | POA: Diagnosis not present

## 2021-03-17 DIAGNOSIS — Z6830 Body mass index (BMI) 30.0-30.9, adult: Secondary | ICD-10-CM | POA: Diagnosis not present

## 2021-03-17 DIAGNOSIS — K219 Gastro-esophageal reflux disease without esophagitis: Secondary | ICD-10-CM | POA: Diagnosis not present

## 2021-03-25 DIAGNOSIS — H524 Presbyopia: Secondary | ICD-10-CM | POA: Diagnosis not present

## 2021-04-13 DIAGNOSIS — H11152 Pinguecula, left eye: Secondary | ICD-10-CM | POA: Diagnosis not present

## 2021-04-13 DIAGNOSIS — H401233 Low-tension glaucoma, bilateral, severe stage: Secondary | ICD-10-CM | POA: Diagnosis not present

## 2021-04-13 DIAGNOSIS — H47213 Primary optic atrophy, bilateral: Secondary | ICD-10-CM | POA: Diagnosis not present

## 2021-04-13 DIAGNOSIS — Z9889 Other specified postprocedural states: Secondary | ICD-10-CM | POA: Diagnosis not present

## 2021-04-13 DIAGNOSIS — H16223 Keratoconjunctivitis sicca, not specified as Sjogren's, bilateral: Secondary | ICD-10-CM | POA: Diagnosis not present

## 2021-04-13 DIAGNOSIS — Z961 Presence of intraocular lens: Secondary | ICD-10-CM | POA: Diagnosis not present

## 2021-04-20 ENCOUNTER — Other Ambulatory Visit: Payer: Self-pay

## 2021-04-20 ENCOUNTER — Encounter: Payer: Self-pay | Admitting: Family Medicine

## 2021-04-20 ENCOUNTER — Ambulatory Visit: Payer: Medicare PPO | Admitting: Family Medicine

## 2021-04-20 VITALS — BP 136/72 | HR 83 | Temp 98.4°F | Wt 162.0 lb

## 2021-04-20 DIAGNOSIS — M7632 Iliotibial band syndrome, left leg: Secondary | ICD-10-CM

## 2021-04-20 DIAGNOSIS — I10 Essential (primary) hypertension: Secondary | ICD-10-CM

## 2021-04-20 DIAGNOSIS — G2581 Restless legs syndrome: Secondary | ICD-10-CM | POA: Diagnosis not present

## 2021-04-20 DIAGNOSIS — L731 Pseudofolliculitis barbae: Secondary | ICD-10-CM

## 2021-04-20 MED ORDER — CYCLOBENZAPRINE HCL 5 MG PO TABS: 5.0000 mg | ORAL_TABLET | Freq: Every evening | ORAL | 0 refills | Status: DC | PRN

## 2021-04-20 NOTE — Progress Notes (Signed)
Subjective:    Patient ID: Kristy Becker, female    DOB: Feb 09, 1951, 70 y.o.   MRN: [id number]  Chief Complaint  Patient presents with   Mass    Bump on chin, thinks may be hair bump.   Leg Pain    Still having pain in left leg muscle, getting better but still hurts    HPI Patient was seen today for acute concern and follow-up on ongoing concerns.  Patient endorses picking a hair out of a pop underneath her chin.  Area is healing but is slightly darker around the bump.  Patient inquires what can be used on her skin other than steroid.  Patient notes left lateral leg pain improving but still hurting every once in a while.  Patient doing some stretching exercises at home.  Notes discomfort worse at night feels like she needs to move her legs to get comfortable.  Patient denies weakness, numbness, tingling in LEs, low back pain, heavy lifting/pushing pulling.  Past Medical History:  Diagnosis Date   ALLERGIC RHINITIS 10/06/2007   ANXIETY 01/31/2007   BACK PAIN 08/21/2007   COLONIC POLYPS, HX OF 02/26/2009   ECZEMA, HANDS 05/08/2007   Glaucoma    HYPERTENSION 01/31/2007    Allergies  Allergen Reactions   Azithromycin     nausea   Cephalosporins Other (See Comments)    unknown   Clarithromycin Other (See Comments)    unknown   Doxycycline Hyclate Other (See Comments)   Penicillins Hives    ROS General: Denies fever, chills, night sweats, changes in weight, changes in appetite HEENT: Denies headaches, ear pain, changes in vision, rhinorrhea, sore throat CV: Denies CP, palpitations, SOB, orthopnea Pulm: Denies SOB, cough, wheezing GI: Denies abdominal pain, nausea, vomiting, diarrhea, constipation GU: Denies dysuria, hematuria, frequency, vaginal discharge Msk: Denies muscle cramps, joint pains  +muscle spasm on L lateral Thigh Neuro: Denies weakness, numbness, tingling Skin: Denies rashes, bruising +hair bump Psych: Denies depression, anxiety, hallucinations +anxiety      Objective:    Blood pressure 136/72, pulse 83, temperature 98.4 F (36.9 C), temperature source Oral, weight 162 lb (73.5 kg), SpO2 99 %.  Gen. Pleasant, well-nourished, in no distress, normal affect   HEENT: [place]/AT, face symmetric, conjunctiva clear, no scleral icterus, PERRLA, EOMI, nares patent without drainage Lungs: no accessory muscle use Cardiovascular: RRR, no peripheral edema Musculoskeletal: Tightness and TTP of left IT band.  No muscle atrophy in bilateral thighs.  No deformities, no cyanosis or clubbing, normal tone Neuro:  A&Ox3, CN II-XII intact, normal gait Skin:  Warm, dry, intact.  Flat, circular hypopigmented area with smaller hypopigmented area inferiorly with hyperpigmentation surrounding the central area.  No hair present within lesion.  Healing.  Wt Readings from Last 3 Encounters:  04/20/21 162 lb (73.5 kg)  03/16/21 162 lb 3.2 oz (73.6 kg)  02/23/21 161 lb 6.4 oz (73.2 kg)    Lab Results  Component Value Date   WBC 4.7 03/16/2021   HGB 13.7 03/16/2021   HCT 41.0 03/16/2021   PLT 250.0 03/16/2021   GLUCOSE 93 03/16/2021   CHOL 181 03/16/2021   TRIG 80.0 03/16/2021   HDL 61.10 03/16/2021   LDLDIRECT 143.7 02/20/2010   LDLCALC 104 (H) 03/16/2021   ALT 23 04/08/2016   AST 16 04/08/2016   NA 139 03/16/2021   K 3.8 03/16/2021   CL 103 03/16/2021   CREATININE 0.80 03/16/2021   BUN 17 03/16/2021   CO2 27 03/16/2021   TSH 0.30 (L)  03/16/2021   INR 1.01 09/29/2009   HGBA1C 5.7 03/16/2021    Assessment/Plan:  RLS (restless legs syndrome)  -Potassium, vitamin D, vitamin B12 normal on 03/16/2021 -Discussed supportive care including stretching, heat, ice, topical analgesics -We will try Flexeril 5 mg nightly as needed - Plan: cyclobenzaprine (FLEXERIL) 5 MG tablet  Iliotibial band syndrome of left side  -Discussed stretching exercises, heat, massage, topical analgesics, ice -Flexeril as needed at night - Plan: cyclobenzaprine (FLEXERIL) 5 MG  tablet  Essential hypertension -Stable -Mildly elevated this visit likely 2/2 history of GAD -Continue current medications.  Benazepril-hydrochlorothiazide 20-12.5 mg daily -Continue lifestyle modifications -Continue to monitor  Ingrown hair -Patient advised to avoid picking at skin to avoid increased hyperpigmentation -Can use OTC vitamin E oil on skin -Continue to monitor  F/u as needed for continued or worsened symptoms  Grier Mitts, MD

## 2021-04-21 ENCOUNTER — Ambulatory Visit (INDEPENDENT_AMBULATORY_CARE_PROVIDER_SITE_OTHER): Payer: Medicare PPO | Admitting: Podiatry

## 2021-04-21 ENCOUNTER — Ambulatory Visit: Payer: Medicare PPO | Admitting: Podiatry

## 2021-04-21 ENCOUNTER — Encounter: Payer: Self-pay | Admitting: Podiatry

## 2021-04-21 DIAGNOSIS — R142 Eructation: Secondary | ICD-10-CM | POA: Insufficient documentation

## 2021-04-21 DIAGNOSIS — M6740 Ganglion, unspecified site: Secondary | ICD-10-CM | POA: Diagnosis not present

## 2021-04-21 DIAGNOSIS — R143 Flatulence: Secondary | ICD-10-CM | POA: Insufficient documentation

## 2021-04-21 DIAGNOSIS — Z1211 Encounter for screening for malignant neoplasm of colon: Secondary | ICD-10-CM | POA: Insufficient documentation

## 2021-04-21 DIAGNOSIS — N9489 Other specified conditions associated with female genital organs and menstrual cycle: Secondary | ICD-10-CM | POA: Insufficient documentation

## 2021-04-21 DIAGNOSIS — R5383 Other fatigue: Secondary | ICD-10-CM | POA: Insufficient documentation

## 2021-04-21 DIAGNOSIS — Z800 Family history of malignant neoplasm of digestive organs: Secondary | ICD-10-CM | POA: Insufficient documentation

## 2021-04-21 DIAGNOSIS — M79675 Pain in left toe(s): Secondary | ICD-10-CM

## 2021-04-21 DIAGNOSIS — R1032 Left lower quadrant pain: Secondary | ICD-10-CM | POA: Insufficient documentation

## 2021-04-21 DIAGNOSIS — B351 Tinea unguium: Secondary | ICD-10-CM | POA: Diagnosis not present

## 2021-04-21 DIAGNOSIS — M79674 Pain in right toe(s): Secondary | ICD-10-CM

## 2021-04-21 DIAGNOSIS — D175 Benign lipomatous neoplasm of intra-abdominal organs: Secondary | ICD-10-CM | POA: Insufficient documentation

## 2021-04-21 DIAGNOSIS — K219 Gastro-esophageal reflux disease without esophagitis: Secondary | ICD-10-CM | POA: Insufficient documentation

## 2021-04-21 DIAGNOSIS — K5730 Diverticulosis of large intestine without perforation or abscess without bleeding: Secondary | ICD-10-CM | POA: Insufficient documentation

## 2021-04-21 DIAGNOSIS — E739 Lactose intolerance, unspecified: Secondary | ICD-10-CM | POA: Insufficient documentation

## 2021-04-21 MED ORDER — DICLOFENAC SODIUM 1 % EX GEL: 2.0000 g | Freq: Every day | CUTANEOUS | 0 refills | Status: DC | PRN

## 2021-04-21 NOTE — Patient Instructions (Signed)
You can use fungi-nail on the toenails °

## 2021-04-24 NOTE — Progress Notes (Signed)
Subjective: 70 year old female presents the office with primary concerns of nail fungus.  She states the nails are thickened and discolored and she is asking there is any treatment that can be done.  Do cause discomfort at times.  Left second digit toenail has come off previously.  No swelling or redness to the toenail sites.  She does have a cyst on the top of her right foot which is been ongoing.  She states that it hurts a little bit.  She states is not really causing much of an issue no pain today.  Is not worsened in size since I last saw her.  She has no other concerns.  Objective: AAO x3, NAD DP/PT pulses palpable bilaterally, CRT less than 3 seconds Nails appear to be hypertrophic, dystrophic with yellow to brown discoloration.  No edema, erythema to the toenail sites.  Tenderness nails 1-5 bilaterally.   On the dorsal aspect of the right foot is a bone spur with mild overlying fluid consistent with ganglion cyst.  No significant pain today.  No erythema or warmth.  Flexor, extensor tendons appear to be intact.  No open lesions or pre-ulcerative lesions.  No pain with calf compression, swelling, warmth, erythema  Assessment: Symptomatic onychomycosis, dorsal spur/cyst right foot  Plan: -All treatment options discussed with the patient including all alternatives, risks, complications.  -Sharply debrided the nails x10 without any complications or bleeding.  Discussed various options for the nails.  Discussions wants to start with over-the-counter topical medication.  Discussed Fungi-Nail.  No improvement we can switch medications. -Monitor the cyst on the right foot.  Minimal fluid today.  At some point discussed or aspiration if needed.  She will continue monitor.  Discussed shoe modifications, releasing that she is to avoid any pressure. -Patient encouraged to call the office with any questions, concerns, change in symptoms.   Trula Slade DPM

## 2021-05-12 DIAGNOSIS — N9489 Other specified conditions associated with female genital organs and menstrual cycle: Secondary | ICD-10-CM | POA: Diagnosis not present

## 2021-05-12 DIAGNOSIS — N841 Polyp of cervix uteri: Secondary | ICD-10-CM | POA: Diagnosis not present

## 2021-05-25 DIAGNOSIS — H04123 Dry eye syndrome of bilateral lacrimal glands: Secondary | ICD-10-CM | POA: Diagnosis not present

## 2021-05-25 DIAGNOSIS — Z961 Presence of intraocular lens: Secondary | ICD-10-CM | POA: Diagnosis not present

## 2021-05-25 DIAGNOSIS — H401133 Primary open-angle glaucoma, bilateral, severe stage: Secondary | ICD-10-CM | POA: Diagnosis not present

## 2021-05-25 DIAGNOSIS — H018 Other specified inflammations of eyelid: Secondary | ICD-10-CM | POA: Diagnosis not present

## 2021-06-25 ENCOUNTER — Other Ambulatory Visit: Payer: Self-pay

## 2021-06-25 MED ORDER — BENAZEPRIL-HYDROCHLOROTHIAZIDE 20-12.5 MG PO TABS: 1.0000 | ORAL_TABLET | Freq: Every day | ORAL | 1 refills | Status: DC

## 2021-08-14 DIAGNOSIS — H018 Other specified inflammations of eyelid: Secondary | ICD-10-CM | POA: Diagnosis not present

## 2021-08-14 DIAGNOSIS — H04123 Dry eye syndrome of bilateral lacrimal glands: Secondary | ICD-10-CM | POA: Diagnosis not present

## 2021-08-20 DIAGNOSIS — H401133 Primary open-angle glaucoma, bilateral, severe stage: Secondary | ICD-10-CM | POA: Diagnosis not present

## 2021-09-28 DIAGNOSIS — Z1231 Encounter for screening mammogram for malignant neoplasm of breast: Secondary | ICD-10-CM | POA: Diagnosis not present

## 2021-10-21 ENCOUNTER — Ambulatory Visit: Payer: Medicare PPO | Admitting: Family Medicine

## 2021-10-21 ENCOUNTER — Telehealth: Payer: Self-pay | Admitting: Family Medicine

## 2021-10-21 ENCOUNTER — Encounter: Payer: Self-pay | Admitting: Family Medicine

## 2021-10-21 VITALS — BP 120/72 | HR 70 | Temp 98.4°F | Wt 165.6 lb

## 2021-10-21 DIAGNOSIS — F411 Generalized anxiety disorder: Secondary | ICD-10-CM | POA: Diagnosis not present

## 2021-10-21 DIAGNOSIS — I10 Essential (primary) hypertension: Secondary | ICD-10-CM

## 2021-10-21 DIAGNOSIS — R9431 Abnormal electrocardiogram [ECG] [EKG]: Secondary | ICD-10-CM | POA: Diagnosis not present

## 2021-10-21 DIAGNOSIS — R002 Palpitations: Secondary | ICD-10-CM | POA: Diagnosis not present

## 2021-10-21 LAB — COMPREHENSIVE METABOLIC PANEL
ALT: 20 U/L (ref 0–35)
AST: 17 U/L (ref 0–37)
Albumin: 4.1 g/dL (ref 3.5–5.2)
Alkaline Phosphatase: 60 U/L (ref 39–117)
BUN: 19 mg/dL (ref 6–23)
CO2: 30 mEq/L (ref 19–32)
Calcium: 9.9 mg/dL (ref 8.4–10.5)
Chloride: 101 mEq/L (ref 96–112)
Creatinine, Ser: 0.81 mg/dL (ref 0.40–1.20)
GFR: 72.7 mL/min (ref >60.00)
Glucose, Bld: 96 mg/dL (ref 70–99)
Potassium: 4.5 mEq/L (ref 3.5–5.1)
Sodium: 138 mEq/L (ref 135–145)
Total Bilirubin: 0.4 mg/dL (ref 0.2–1.2)
Total Protein: 7.8 g/dL (ref 6.0–8.3)

## 2021-10-21 LAB — CBC WITH DIFFERENTIAL/PLATELET
Basophils Absolute: 0 10*3/uL (ref 0.0–0.1)
Basophils Relative: 0.5 % (ref 0.0–3.0)
Eosinophils Absolute: 0.1 10*3/uL (ref 0.0–0.7)
Eosinophils Relative: 2.1 % (ref 0.0–5.0)
HCT: 41.6 % (ref 36.0–46.0)
Hemoglobin: 14.1 g/dL (ref 12.0–15.0)
Lymphocytes Relative: 31.6 % (ref 12.0–46.0)
Lymphs Abs: 1.6 10*3/uL (ref 0.7–4.0)
MCHC: 34 g/dL (ref 30.0–36.0)
MCV: 90.9 fl (ref 78.0–100.0)
Monocytes Absolute: 0.5 10*3/uL (ref 0.1–1.0)
Monocytes Relative: 10.9 % (ref 3.0–12.0)
Neutro Abs: 2.7 10*3/uL (ref 1.4–7.7)
Neutrophils Relative %: 54.9 % (ref 43.0–77.0)
Platelets: 260 10*3/uL (ref 150.0–400.0)
RBC: 4.57 Mil/uL (ref 3.87–5.11)
RDW: 13 % (ref 11.5–15.5)
WBC: 5 10*3/uL (ref 4.0–10.5)

## 2021-10-21 LAB — LIPID PANEL
Cholesterol: 216 mg/dL — ABNORMAL HIGH (ref 0–200)
HDL: 62.3 mg/dL (ref >39.00)
LDL Cholesterol: 137 mg/dL — ABNORMAL HIGH (ref 0–99)
NonHDL: 153.31
Total CHOL/HDL Ratio: 3
Triglycerides: 81 mg/dL (ref 0.0–149.0)
VLDL: 16.2 mg/dL (ref 0.0–40.0)

## 2021-10-21 LAB — TSH: TSH: 0.37 u[IU]/mL (ref 0.35–5.50)

## 2021-10-21 LAB — T4, FREE: Free T4: 0.96 ng/dL (ref 0.60–1.60)

## 2021-10-21 LAB — HEMOGLOBIN A1C: Hgb A1c MFr Bld: 5.6 % (ref 4.6–6.5)

## 2021-10-21 NOTE — Telephone Encounter (Signed)
Pt just left and would like a callback for someone to explain her EKG ?

## 2021-10-21 NOTE — Progress Notes (Signed)
Subjective:  ? ? Patient ID: DMIYA MALPHRUS, female    DOB: 03/06/1951, 71 y.o.   MRN: [id number] ? ?Chief Complaint  ?Patient presents with  ? Follow-up  ?  Felt heart flutter on Sunday. Was fixing dinner, rate started at 98 and then started coming down. Felt faint and not sure if it was just being anxious.   ? ? ?HPI ?Patient is a 71 yo female with pmh sig for GAD, HTN, glaucoma, seasonal allergies who was seen today for acute concern.  Pt states on Sunday 5/7 she was preparing dinner then sat down when she noticed her heart began racing and she felt woozy.  Heart rate was 98, which made pt more anxious.  Pt denies chest pain, shortness of breath, headaches, increased stress or physical activity. ? ?Past Medical History:  ?Diagnosis Date  ? ALLERGIC RHINITIS 10/06/2007  ? ANXIETY 01/31/2007  ? BACK PAIN 08/21/2007  ? COLONIC POLYPS, HX OF 02/26/2009  ? ECZEMA, HANDS 05/08/2007  ? Glaucoma   ? HYPERTENSION 01/31/2007  ? ? ?Allergies  ?Allergen Reactions  ? Azithromycin   ?  nausea  ? Cephalosporins Other (See Comments)  ?  unknown  ? Clarithromycin Other (See Comments)  ?  unknown  ? Doxycycline Hyclate Other (See Comments)  ? Penicillins Hives  ? ? ?ROS ?General: Denies fever, chills, night sweats, changes in weight, changes in appetite ?HEENT: Denies headaches, ear pain, changes in vision, rhinorrhea, sore throat +dry eyes, woozy feeling ?CV: Denies CP, SOB, orthopnea  +palpitations ?Pulm: Denies SOB, cough, wheezing ?GI: Denies abdominal pain, nausea, vomiting, diarrhea, constipation ?GU: Denies dysuria, hematuria, frequency, vaginal discharge ?Msk: Denies muscle cramps, joint pains ?Neuro: Denies weakness, numbness, tingling ?Skin: Denies rashes, bruising ?Psych: Denies depression, anxiety, hallucinations ? ?   ?Objective:  ?  ?Blood pressure 120/72, pulse 70, temperature 98.4 ?F (36.9 ?C), temperature source Oral, weight 165 lb 9.6 oz (75.1 kg), SpO2 98 %. ? ?Gen. Pleasant, well-nourished, in no distress, normal  affect   ?HEENT: [place]/AT, face symmetric, conjunctiva clear, no scleral icterus, PERRLA, EOMI, nares patent without drainage ?Neck: No JVD, no thyromegaly, no carotid bruits ?Lungs: no accessory muscle use, CTAB, no wheezes or rales ?Cardiovascular: RRR, no m/r/g, no peripheral edema ?Musculoskeletal: No deformities, no cyanosis or clubbing, normal tone ?Neuro:  A&Ox3, CN II-XII intact, normal gait ?Skin:  Warm, no lesions/ rash ? ? ?Wt Readings from Last 3 Encounters:  ?10/21/21 165 lb 9.6 oz (75.1 kg)  ?04/20/21 162 lb (73.5 kg)  ?03/16/21 162 lb 3.2 oz (73.6 kg)  ? ? ?Lab Results  ?Component Value Date  ? WBC 4.7 03/16/2021  ? HGB 13.7 03/16/2021  ? HCT 41.0 03/16/2021  ? PLT 250.0 03/16/2021  ? GLUCOSE 93 03/16/2021  ? CHOL 181 03/16/2021  ? TRIG 80.0 03/16/2021  ? HDL 61.10 03/16/2021  ? LDLDIRECT 143.7 02/20/2010  ? LDLCALC 104 (H) 03/16/2021  ? ALT 23 04/08/2016  ? AST 16 04/08/2016  ? NA 139 03/16/2021  ? K 3.8 03/16/2021  ? CL 103 03/16/2021  ? CREATININE 0.80 03/16/2021  ? BUN 17 03/16/2021  ? CO2 27 03/16/2021  ? TSH 0.30 (L) 03/16/2021  ? INR 1.01 09/29/2009  ? HGBA1C 5.7 03/16/2021  ? ? ?Assessment/Plan: ? ?Palpitations ?-Pt reports intermittent episodes of heart racing.  None while in clinic. ?-Cause likely multifactorial including GAD. ?-EKG this visit with NSR, VR 66, T wave inversion in lead III, and ST elevation in lead V2.  T wave inversion  in III not previously seen on other EKGs.  Cannot rule out ischemia.  EKGs from 01/02/20, 11/29/19 with sinus bradycardia, flattened T wave in III, ST elevation in V2. ?-We will obtain labs. ?-Given EKG changes we will have patient follow-up with cardiology sooner.  Cardiology office contacted. ?-Consider Holter monitor ? - Plan: EKG 12-Lead, CBC with Differential/Platelet, CMP, TSH, T4, Free, Lipid panel, Hemoglobin A1c, Troponin I ? ?Essential hypertension  ?-Controlled ?-Continue benazepril-HCTZ 20-12.5 mg daily ?-Continue lifestyle modifications ?- Plan: CMP,  Lipid panel ? ?GAD (generalized anxiety disorder) ?-Improving ?-Continue counseling ?-Hydroxyzine as needed ?- Plan: TSH, T4, Free ? ?Abnormal EKG ?- Plan: Troponin I ? ?F/u prn ? ?Grier Mitts, MD ?

## 2021-10-21 NOTE — Telephone Encounter (Signed)
Spoke with pt, informed her the cardiologists would be more able to explain the EKG at her appt on 11/23/21. ?

## 2021-10-22 LAB — TROPONIN I: Troponin I: 5 ng/L (ref <=47)

## 2021-10-27 ENCOUNTER — Ambulatory Visit (HOSPITAL_BASED_OUTPATIENT_CLINIC_OR_DEPARTMENT_OTHER): Payer: Medicare PPO | Admitting: Cardiology

## 2021-10-27 ENCOUNTER — Encounter (HOSPITAL_BASED_OUTPATIENT_CLINIC_OR_DEPARTMENT_OTHER): Payer: Self-pay | Admitting: Cardiology

## 2021-10-27 VITALS — BP 136/72 | HR 74 | Ht 62.0 in | Wt 165.7 lb

## 2021-10-27 DIAGNOSIS — Z7189 Other specified counseling: Secondary | ICD-10-CM

## 2021-10-27 DIAGNOSIS — R002 Palpitations: Secondary | ICD-10-CM | POA: Diagnosis not present

## 2021-10-27 DIAGNOSIS — R9431 Abnormal electrocardiogram [ECG] [EKG]: Secondary | ICD-10-CM | POA: Diagnosis not present

## 2021-10-27 DIAGNOSIS — I10 Essential (primary) hypertension: Secondary | ICD-10-CM | POA: Diagnosis not present

## 2021-10-27 NOTE — Patient Instructions (Signed)
Medication Instructions:  ?Your Physician recommend you continue on your current medication as directed.   ? ?*If you need a refill on your cardiac medications before your next appointment, please call your pharmacy* ? ? ?Lab Work: ?None ordered today ? ? ?Testing/Procedures: ?Your physician has requested that you have an echocardiogram. Echocardiography is a painless test that uses sound waves to create images of your heart. It provides your doctor with information about the size and shape of your heart and how well your heart?s chambers and valves are working. This procedure takes approximately one hour. There are no restrictions for this procedure. ?[place] ? ?Your physician has recommended that you wear a 14 day Zio monitor.  ? ?This monitor is a medical device that records the heart?s electrical activity. Doctors most often use these monitors to diagnose arrhythmias. Arrhythmias are problems with the speed or rhythm of the heartbeat. The monitor is a small device applied to your chest. You can wear one while you do your normal daily activities. While wearing this monitor if you have any symptoms to push the button and record what you felt. Once you have worn this monitor for the period of time provider prescribed (Usually 14 days), you will return the monitor device in the postage paid box. Once it is returned they will download the data collected and provide [place] with a report which the provider will then review and we will call you with those results. Important tips: ? ?Avoid showering during the first 24 hours of wearing the monitor. ?Avoid excessive sweating to help maximize wear time. ?Do not submerge the device, no hot tubs, and no swimming pools. ?Keep any lotions or oils away from the patch. ?After 24 hours you may shower with the patch on. Take brief showers with your back facing the shower head.  ?Do not remove patch once it has been placed because that will interrupt data and  decrease adhesive wear time. ?Push the button when you have any symptoms and write down what you were feeling. ?Once you have completed wearing your monitor, remove and place into box which has postage paid and place in your outgoing mailbox.  ?If for some reason you have misplaced your box then call our office and we can provide another box and/or mail it off for you. ? ?  ? ? ?Follow-Up: ?At [facility], you and your health needs are our priority.  As part of our continuing mission to provide you with exceptional heart care, we have created designated Provider Care Teams.  These Care Teams include your primary Cardiologist (physician) and Advanced Practice Providers (APPs -  Physician Assistants and Nurse Practitioners) who all work together to provide you with the care you need, when you need it. ? ?We recommend signing up for the patient portal called "MyChart".  Sign up information is provided on this After Visit Summary.  MyChart is used to connect with patients for Virtual Visits (Telemedicine).  Patients are able to view lab/test results, encounter notes, upcoming appointments, etc.  Non-urgent messages can be sent to your provider as well.   ?To learn more about what you can do with MyChart, go to [email or web address].   ? ?Your next appointment:   ?As needed ? ?The format for your next appointment:   ?In Person ? ?Provider:   ?Buford Dresser, MD{ ? ? ?Important Information About [place] ? ? ? ? ? ? ?

## 2021-10-27 NOTE — Progress Notes (Signed)
?Cardiology Office Note:   ? ?Date:  10/27/2021  ? ?ID:  Kristy Becker, DOB [birth year], MRN [id number] ? ?PCP:  Billie Ruddy, MD  ?Cardiologist:  Buford Dresser, MD PhD ? ?Referring MD: Billie Ruddy, MD  ? ?Chief complaint: palpitations ? ?History of Present Illness:   ? ?Kristy Becker is a 71 y.o. female with a hx of hypertension who is seen for follow up today. I initially met her 01/27/2018 as a new consult at the request of Dr. Burnice Logan for the evaluation and management of palpitations.  ? ?Today: ?Recently saw Dr. Volanda Napoleon 5/10. About 10 days ago, had episode of palpitations. When she went to Dr. Volanda Napoleon' office, had an ECG and was told it was abnormal. I cannot see ECG, but per notes : "EKG this visit with NSR, VR 66, T wave inversion in lead III, and ST elevation in lead V2.  T wave inversion in III not previously seen on other EKGs.  Cannot rule out ischemia.  EKGs from 01/02/20, 11/29/19 with sinus bradycardia, flattened T wave in III, ST elevation in V2." ? ?On today's ECG, it looks largely unchanged from 11/29/19. T in III has been flat to slightly concave, unchanged. V1/V2 pattern unchanged from prior. Computer calls septal infarct but this is likely due to breast tissue. We reviewed her prior monitor as well, no high risk findings.  ? ?She has had a lot of stress and anxiety recently. Reviewed her symptoms. Reviewed her recent labs. ? ?Denies chest pain, shortness of breath at rest or with normal exertion. No PND, orthopnea, LE edema or unexpected weight gain. No syncope or palpitations.  ? ?Past Medical History:  ?Diagnosis Date  ? ALLERGIC RHINITIS 10/06/2007  ? ANXIETY 01/31/2007  ? BACK PAIN 08/21/2007  ? COLONIC POLYPS, HX OF 02/26/2009  ? ECZEMA, HANDS 05/08/2007  ? Glaucoma   ? HYPERTENSION 01/31/2007  ? ? ?Past Surgical History:  ?Procedure Laterality Date  ? TONSILLECTOMY AND ADENOIDECTOMY    ? ? ?Current Medications: ?Current Outpatient Medications on File Prior to Visit  ?Medication  Sig  ? azelastine (ASTELIN) 0.1 % nasal spray 2 (two) times daily as needed  ? benazepril-hydrochlorthiazide (LOTENSIN HCT) 20-12.5 MG tablet Take 1 tablet by mouth daily.  ? cetirizine (ZYRTEC) 10 MG tablet Take 10 mg by mouth daily.  ? cyclobenzaprine (FLEXERIL) 5 MG tablet Take 1 tablet (5 mg total) by mouth at bedtime as needed for muscle spasms.  ? diclofenac Sodium (VOLTAREN) 1 % GEL Apply 2 g topically daily as needed. Rub into affected area of foot 2 to 4 times daily  ? famotidine (PEPCID) 20 MG tablet Take 20 mg by mouth 2 (two) times daily. OTC  ? hydrOXYzine (ATARAX/VISTARIL) 25 MG tablet   ? MOMETASONE FUROATE NA Place 2 sprays into the nose daily. 50 mcg  ? Multiple Vitamin (MULTIVITAMIN WITH MINERALS) TABS Take 1 tablet by mouth daily.  ? Multiple Vitamins-Minerals (CENTRUM SILVER 50+WOMEN) TABS Take by mouth daily. OTC  ? Probiotic Product (PROBIOTIC DAILY PO) Take by mouth.  ? RESTASIS 0.05 % ophthalmic emulsion   ? ?No current facility-administered medications on file prior to visit.  ?  ? ?Allergies:   Azithromycin, Cephalosporins, Clarithromycin, Doxycycline hyclate, and Penicillins  ? ?Social History  ? ?Tobacco Use  ? Smoking status: Never  ? Smokeless tobacco: Never  ?Substance Use Topics  ? Alcohol use: No  ? Drug use: No  ? ? ?Family History: ?The patient's family is negative for cardiac  disease. Mother had a CVA ? ?ROS:   ?Please see the history of present illness.  Additional pertinent ROS otherwise unremarkable. ? ?EKGs/Labs/Other Studies Reviewed:   ? ?The following studies were reviewed today: ?Monitor 01/2020 ?~6 days of data recorded on Zio monitor. Patient had a min HR of 47 bpm, max HR of 127 bpm, and avg HR of 66 bpm. Predominant underlying rhythm was Sinus Rhythm. No VT, SVT, atrial fibrillation, high degree block, or pauses noted. Isolated atrial ectopy was rare (<1%), and isolated ventricular ectopy was occasional (3.4%). There were 0 triggered events. No significant arrhythmias  detected. ? ?Echo 12/2019 ?1. Left ventricular ejection fraction, by estimation, is 60 to 65%. The  ?left ventricle has normal function. The left ventricle has no regional  ?wall motion abnormalities. Left ventricular diastolic parameters are  ?consistent with Grade I diastolic  ?dysfunction (impaired relaxation). The average left ventricular global  ?longitudinal strain is -19.5 %. The global longitudinal strain is normal.  ? 2. Right ventricular systolic function is normal. The right ventricular  ?size is normal. There is normal pulmonary artery systolic pressure.  ? 3. The mitral valve is normal in structure. Trivial mitral valve  ?regurgitation. No evidence of mitral stenosis.  ? 4. The aortic valve is normal in structure. Aortic valve regurgitation is  ?not visualized. No aortic stenosis is present.  ? 5. The inferior vena cava is normal in size with greater than 50%  ?respiratory variability, suggesting right atrial pressure of 3 mmHg.  ? ?MPI 2008: normal ? ?EKG:  EKG is personally reviewed today.  ?10/27/21: T in III has been flat to slightly concave, unchanged. V1/V2 pattern unchanged from prior ?01/02/20: normal sinus rhythm, flattened T wave in 3, low anterior force V1/V2. ? ?Recent Labs: ?03/16/2021: Magnesium 1.8 ?10/21/2021: ALT 20; BUN 19; Creatinine, Ser 0.81; Hemoglobin 14.1; Platelets 260.0; Potassium 4.5; Sodium 138; TSH 0.37  ?Recent Lipid Panel ? ?   ?Component Value Date/Time  ? CHOL 216 (H) 10/21/2021 1010  ? TRIG 81.0 10/21/2021 1010  ? HDL 62.30 10/21/2021 1010  ? CHOLHDL 3 10/21/2021 1010  ? VLDL 16.2 10/21/2021 1010  ? LDLCALC 137 (H) 10/21/2021 1010  ? LDLDIRECT 143.7 02/20/2010 0813  ? ? ?Physical Exam:   ? ?VS:  BP 136/72 (BP Location: Left Arm, Patient Position: Sitting, Cuff Size: Large)   Pulse 74   Ht 5' 2" (1.575 m)   Wt 165 lb 11.2 oz (75.2 kg)   BMI 30.31 kg/m?    ? ?Wt Readings from Last 3 Encounters:  ?10/27/21 165 lb 11.2 oz (75.2 kg)  ?10/21/21 165 lb 9.6 oz (75.1 kg)   ?04/20/21 162 lb (73.5 kg)  ?  ?GEN: Well nourished, well developed in no acute distress ?HEENT: Normal, moist mucous membranes ?NECK: No JVD ?CARDIAC: regular rhythm, normal S1 and S2, no rubs or gallops. No murmur. ?VASCULAR: Radial and DP pulses 2+ bilaterally. No carotid bruits ?RESPIRATORY:  Clear to auscultation without rales, wheezing or rhonchi  ?ABDOMEN: Soft, non-tender, non-distended ?MUSCULOSKELETAL:  Ambulates independently ?SKIN: Warm and dry, no edema ?NEUROLOGIC:  Alert and oriented x 3. No focal neuro deficits noted. ?PSYCHIATRIC:  Normal affect   ? ?ASSESSMENT:   ? ?1. Heart palpitations   ?2. Abnormal ECG   ?3. Essential hypertension   ?4. Cardiac risk counseling   ?5. Counseling on health promotion and disease prevention   ? ? ?PLAN:   ? ?Abnormal ECG ?Occasional skipped beats, low burden PVCS: ?-ECG not significantly different compared  to prior we have. Suspect minor differences may be due to lead placement. No high risk findings ?-palpitations worsened recently repeat monitor ?-echocardiogram ? ?Hypertension: ?-near goal today, recently at goal with PCP ?-continue benazepril-HCTZ ? ?Primary cardiovascular prevention:  ?-recommend heart healthy/Mediterranean diet, with whole grains, fruits, vegetable, fish, lean meats, nuts, and olive oil. Limit salt. ?-recommend moderate walking, 3-5 times/week for 30-50 minutes each session. Aim for at least 150 minutes.week. Goal should be pace of 3 miles/hours, or walking 1.5 miles in 30 minutes ?-recommend avoidance of tobacco products. Avoid excess alcohol. ? ?Plan for follow up: PRN if monitor unremarkable ? ?Medication Adjustments/Labs and Tests Ordered: ?Current medicines are reviewed at length with the patient today.  Concerns regarding medicines are outlined above.  ?Orders Placed This Encounter  ?Procedures  ? LONG TERM MONITOR (3-14 DAYS)  ? EKG 12-Lead  ? ECHOCARDIOGRAM COMPLETE  ? ?No orders of the defined types were placed in this  encounter. ? ? ?Patient Instructions  ?Medication Instructions:  ?Your Physician recommend you continue on your current medication as directed.   ? ?*If you need a refill on your cardiac medications before your next appointment,

## 2021-11-02 ENCOUNTER — Ambulatory Visit (INDEPENDENT_AMBULATORY_CARE_PROVIDER_SITE_OTHER): Payer: Medicare PPO

## 2021-11-02 ENCOUNTER — Ambulatory Visit (INDEPENDENT_AMBULATORY_CARE_PROVIDER_SITE_OTHER): Payer: Medicare PPO | Admitting: Podiatry

## 2021-11-02 DIAGNOSIS — B351 Tinea unguium: Secondary | ICD-10-CM | POA: Diagnosis not present

## 2021-11-02 DIAGNOSIS — M779 Enthesopathy, unspecified: Secondary | ICD-10-CM | POA: Diagnosis not present

## 2021-11-02 DIAGNOSIS — M79674 Pain in right toe(s): Secondary | ICD-10-CM | POA: Diagnosis not present

## 2021-11-02 DIAGNOSIS — M19071 Primary osteoarthritis, right ankle and foot: Secondary | ICD-10-CM

## 2021-11-02 DIAGNOSIS — M79675 Pain in left toe(s): Secondary | ICD-10-CM

## 2021-11-02 MED ORDER — DICLOFENAC SODIUM 1 % EX GEL: 2.0000 g | Freq: Every day | CUTANEOUS | 0 refills | Status: AC | PRN

## 2021-11-04 ENCOUNTER — Ambulatory Visit (INDEPENDENT_AMBULATORY_CARE_PROVIDER_SITE_OTHER): Payer: Medicare PPO

## 2021-11-04 ENCOUNTER — Ambulatory Visit (HOSPITAL_BASED_OUTPATIENT_CLINIC_OR_DEPARTMENT_OTHER): Payer: Medicare PPO

## 2021-11-04 DIAGNOSIS — R002 Palpitations: Secondary | ICD-10-CM

## 2021-11-04 LAB — ECHOCARDIOGRAM COMPLETE
AR max vel: 1.87 cm2
AV Area VTI: 2.27 cm2
AV Area mean vel: 1.81 cm2
AV Mean grad: 5 mmHg
AV Peak grad: 10.4 mmHg
Ao pk vel: 1.61 m/s
Area-P 1/2: 3.05 cm2
Calc EF: 64 %
S' Lateral: 2.92 cm
Single Plane A2C EF: 64.4 %
Single Plane A4C EF: 62.7 %

## 2021-11-04 NOTE — Addendum Note (Signed)
Addended by: Meryl Crutch on: 11/04/2021 03:00 PM   Modules accepted: Orders

## 2021-11-04 NOTE — Progress Notes (Signed)
Subjective: 71 year old female presents the office today for concerns of thick, elongated toenails that she is not able to trim her self.  She was using topical over-the-counter medication.  No swelling or redness or drainage to the toenail sites.  Also discussed concerns of possible cyst, swelling of the top of her right foot.  Not causing significant discomfort.  No recent injuries or changes otherwise.   Objective: AAO x3, NAD DP/PT pulses palpable bilaterally, CRT less than 3 seconds Nails are hypertrophic, dystrophic, brittle, discolored, elongated 10. No surrounding redness or drainage. Tenderness nails 1-5 bilaterally. No open lesions or pre-ulcerative lesions are identified today. On the dorsal aspect of the right foot prominence on the midfoot consistent with a bone spur.  No significant soft tissue mass or fluid is noted.  Extensor tendons appear to be intact.  Subjectively she gets discomfort in this area but no significant pain today.  MMT 5/5. No pain with calf compression, swelling, warmth, erythema  Assessment: Symptomatic onychomycosis, bone spur right foot  Plan: -All treatment options discussed with the patient including all alternatives, risks, complications.  -X-rays were obtained and reviewed of the right foot.  3 views were obtained.  Spurring present the dorsal midfoot.  No evidence of acute fracture.  Mild arthritic changes present. -Regards to the right foot pain discussed offloading as well as releasing shoes to avoid pressure.  Not causing significant pain so we held off on steroid injection to the area.  Discussed topical medication including Voltaren gel that she can apply. -Sharply debrided the nails x10 without any complications or bleeding.  Continue topical medication. -Patient encouraged to call the office with any questions, concerns, change in symptoms.   Trula Slade DPM

## 2021-11-10 ENCOUNTER — Telehealth: Payer: Self-pay | Admitting: Cardiology

## 2021-11-10 NOTE — Telephone Encounter (Signed)
Patient is returning call to discuss echo results. °

## 2021-11-10 NOTE — Telephone Encounter (Signed)
Buford Dresser, MD  11/05/2021  8:30 AM EDT     Echo is very reassuring, normal pump function, normal valves. Nothing to explain a cardiac cause for symptoms thus far. We will see what the monitor shows   Advised patient, verbalized understanding

## 2021-11-18 DIAGNOSIS — H04123 Dry eye syndrome of bilateral lacrimal glands: Secondary | ICD-10-CM | POA: Diagnosis not present

## 2021-11-18 DIAGNOSIS — H018 Other specified inflammations of eyelid: Secondary | ICD-10-CM | POA: Diagnosis not present

## 2021-11-23 ENCOUNTER — Ambulatory Visit (HOSPITAL_BASED_OUTPATIENT_CLINIC_OR_DEPARTMENT_OTHER): Payer: Medicare PPO | Admitting: Cardiology

## 2021-11-23 ENCOUNTER — Telehealth: Payer: Self-pay | Admitting: Family Medicine

## 2021-11-23 NOTE — Telephone Encounter (Signed)
Wants to discuss her lab results, thinks her cholesterol is elevated

## 2021-12-02 NOTE — Telephone Encounter (Signed)
Spoke to patient. Patient has questions regarding to the cholesterol and starting the statin. Scheduled a follow up visit with Dr. Volanda Napoleon on 12/03/21 at 11am.

## 2021-12-03 ENCOUNTER — Ambulatory Visit: Payer: Medicare PPO | Admitting: Family Medicine

## 2021-12-04 ENCOUNTER — Other Ambulatory Visit: Payer: Self-pay | Admitting: Family Medicine

## 2021-12-10 ENCOUNTER — Encounter: Payer: Self-pay | Admitting: Family Medicine

## 2021-12-10 ENCOUNTER — Ambulatory Visit: Payer: Medicare PPO | Admitting: Family Medicine

## 2021-12-10 VITALS — BP 146/74 | HR 90 | Temp 98.1°F | Wt 160.4 lb

## 2021-12-10 DIAGNOSIS — E782 Mixed hyperlipidemia: Secondary | ICD-10-CM

## 2021-12-10 DIAGNOSIS — I10 Essential (primary) hypertension: Secondary | ICD-10-CM | POA: Diagnosis not present

## 2021-12-10 DIAGNOSIS — F411 Generalized anxiety disorder: Secondary | ICD-10-CM

## 2021-12-10 NOTE — Patient Instructions (Addendum)
We will have you decrease the amount of cheese you are eating daily then have your cholesterol rechecked in the next month or 2.  For continued elevation in cholesterol we will consider starting rosuvastatin 5 mg a few times per week.  Behavioral Health Services: -to make an appointment contact the office/provider you are interested in seeing.  No referral is needed.  The below is not an all inclusive list, but will help you get started.  [email or web address] -counseling located off of [address].  [email or web address] -website helps you find providers in your area  [facility] -Located off of [address]. across from [place]  Dr. Darleene Cleaver is a Teacher, music with [employer] [phone]  [place]  [phone] -a place in town that has counseling and Psychiatry services.

## 2021-12-10 NOTE — Progress Notes (Signed)
Subjective:    Patient ID: Kristy Becker, female    DOB: 04/18/1951, 71 y.o.   MRN: [id number]  Chief Complaint  Patient presents with   Follow-up    Patient is here to discuss on her cholesterol lab result.     HPI Patient was seen today for f/u on cholesterol. Cholesterol 216, HDL 62.3, LDL, 137, and triglycerides 81 on 5/10/223.  Pt concerned about having to take cholesterol medication due to possible s/e.  Patient thinks increased cheese intake caused elevation of cholesterol.  Pt inquires about list of [place] providers for counseling for history of anxiety.  Past Medical History:  Diagnosis Date   ALLERGIC RHINITIS 10/06/2007   ANXIETY 01/31/2007   BACK PAIN 08/21/2007   COLONIC POLYPS, HX OF 02/26/2009   ECZEMA, HANDS 05/08/2007   Glaucoma    HYPERTENSION 01/31/2007    Allergies  Allergen Reactions   Azithromycin     nausea   Cephalosporins Other (See Comments)    unknown   Clarithromycin Other (See Comments)    unknown   Doxycycline Hyclate Other (See Comments)   Penicillins Hives    ROS General: Denies fever, chills, night sweats, changes in weight, changes in appetite HEENT: Denies headaches, ear pain, changes in vision, rhinorrhea, sore throat CV: Denies CP, palpitations, SOB, orthopnea Pulm: Denies SOB, cough, wheezing GI: Denies abdominal pain, nausea, vomiting, diarrhea, constipation GU: Denies dysuria, hematuria, frequency, vaginal discharge Msk: Denies muscle cramps, joint pains Neuro: Denies weakness, numbness, tingling Skin: Denies rashes, bruising Psych: Denies depression, anxiety, hallucinations      Objective:    Blood pressure (!) 146/74, pulse 90, temperature 98.1 F (36.7 C), temperature source Oral, weight 160 lb 6.4 oz (72.8 kg), SpO2 96 %.  Gen. Pleasant, well-nourished, in no distress, normal affect   HEENT: [place]/AT, face symmetric, conjunctiva clear, no scleral icterus, PERRLA, EOMI, nares patent without drainage Lungs: no accessory muscle  use, CTAB, no wheezes or rales Cardiovascular: RRR, no m/r/g, no peripheral edema Neuro:  A&Ox3, CN II-XII intact, normal gait Skin:  Warm, no lesions/ rash   Wt Readings from Last 3 Encounters:  12/10/21 160 lb 6.4 oz (72.8 kg)  10/27/21 165 lb 11.2 oz (75.2 kg)  10/21/21 165 lb 9.6 oz (75.1 kg)    Lab Results  Component Value Date   WBC 5.0 10/21/2021   HGB 14.1 10/21/2021   HCT 41.6 10/21/2021   PLT 260.0 10/21/2021   GLUCOSE 96 10/21/2021   CHOL 216 (H) 10/21/2021   TRIG 81.0 10/21/2021   HDL 62.30 10/21/2021   LDLDIRECT 143.7 02/20/2010   LDLCALC 137 (H) 10/21/2021   ALT 20 10/21/2021   AST 17 10/21/2021   NA 138 10/21/2021   K 4.5 10/21/2021   CL 101 10/21/2021   CREATININE 0.81 10/21/2021   BUN 19 10/21/2021   CO2 30 10/21/2021   TSH 0.37 10/21/2021   INR 1.01 09/29/2009   HGBA1C 5.6 10/21/2021    Assessment/Plan:  Mixed hyperlipidemia  -Total cholesterol 216, triglycerides 81, HDL 62, LDL 137 on 10/21/2021 -Discussed 10-year risk of CVD 17.8% currently but can improved to 8.8% with optimization -Discussed r/b/a of statins. -Patient wishes to continue working on lifestyle modifications for the next few months including decreasing intake of cheese to see if cholesterol improves.  For continued cholesterol elevation start low-dose rosuvastatin 5 mg a few times per week or daily. -Given handout - Plan: Lipid panel  Essential hypertension -Elevated -Likely 2/2 increased anxiety as typically lower during the  last few visits -Recheck -Lifestyle modifications -Continue current medication including benazepril-hydrochlorothiazide 20-12.5 mg daily -Continue monitoring BP at home  GAD (generalized anxiety disorder) -Stable -Patient interested in counseling.  Given handout regarding area providers -continue to monitor  F/u in 6 wks for lab visit to recheck cholesterol.  Grier Mitts, MD

## 2021-12-11 ENCOUNTER — Other Ambulatory Visit (INDEPENDENT_AMBULATORY_CARE_PROVIDER_SITE_OTHER): Payer: Medicare PPO

## 2021-12-11 DIAGNOSIS — E782 Mixed hyperlipidemia: Secondary | ICD-10-CM

## 2021-12-11 LAB — LIPID PANEL
Cholesterol: 190 mg/dL (ref 0–200)
HDL: 58.8 mg/dL (ref >39.00)
LDL Cholesterol: 115 mg/dL — ABNORMAL HIGH (ref 0–99)
NonHDL: 131.6
Total CHOL/HDL Ratio: 3
Triglycerides: 85 mg/dL (ref 0.0–149.0)
VLDL: 17 mg/dL (ref 0.0–40.0)

## 2021-12-18 ENCOUNTER — Ambulatory Visit (HOSPITAL_BASED_OUTPATIENT_CLINIC_OR_DEPARTMENT_OTHER): Payer: Medicare PPO | Admitting: Cardiology

## 2021-12-23 DIAGNOSIS — J3089 Other allergic rhinitis: Secondary | ICD-10-CM | POA: Diagnosis not present

## 2021-12-23 DIAGNOSIS — J301 Allergic rhinitis due to pollen: Secondary | ICD-10-CM | POA: Diagnosis not present

## 2021-12-23 DIAGNOSIS — J3081 Allergic rhinitis due to animal (cat) (dog) hair and dander: Secondary | ICD-10-CM | POA: Diagnosis not present

## 2021-12-23 DIAGNOSIS — H1045 Other chronic allergic conjunctivitis: Secondary | ICD-10-CM | POA: Diagnosis not present

## 2022-02-26 ENCOUNTER — Telehealth: Payer: Self-pay | Admitting: Family Medicine

## 2022-02-26 NOTE — Telephone Encounter (Signed)
Left message for patient to call back and schedule Medicare Annual Wellness Visit (AWV) either virtually or in office. Left  my Kristy Becker number [phone]   Last AWV  03/16/21 ; please schedule at anytime with [facility] Nurse Health Advisor 1 or 2

## 2022-03-01 ENCOUNTER — Ambulatory Visit: Payer: Medicare PPO | Admitting: Family Medicine

## 2022-03-03 DIAGNOSIS — H018 Other specified inflammations of eyelid: Secondary | ICD-10-CM | POA: Diagnosis not present

## 2022-03-03 DIAGNOSIS — H04123 Dry eye syndrome of bilateral lacrimal glands: Secondary | ICD-10-CM | POA: Diagnosis not present

## 2022-03-04 DIAGNOSIS — H401133 Primary open-angle glaucoma, bilateral, severe stage: Secondary | ICD-10-CM | POA: Diagnosis not present

## 2022-03-06 IMAGING — US US PELVIS COMPLETE WITH TRANSVAGINAL
1 series · 13 of 25 positions shown · non-contrast
Comparison: None

CLINICAL DATA: Bloating and left lower quadrant pain

EXAM:
TRANSABDOMINAL AND TRANSVAGINAL ULTRASOUND OF PELVIS
TECHNIQUE: Both transabdominal and transvaginal ultrasound examinations of the
pelvis were performed. Transabdominal technique was performed for
global imaging of the pelvis including uterus, ovaries, adnexal
regions, and pelvic cul-de-sac. It was necessary to proceed with
endovaginal exam following the transabdominal exam to visualize the
uterus endometrium adnexa.

[Series 1: us pelvis complete with transvaginal · 0.28mm/px · 49 acquisitions, 13 frames shown]
[im 1/49]
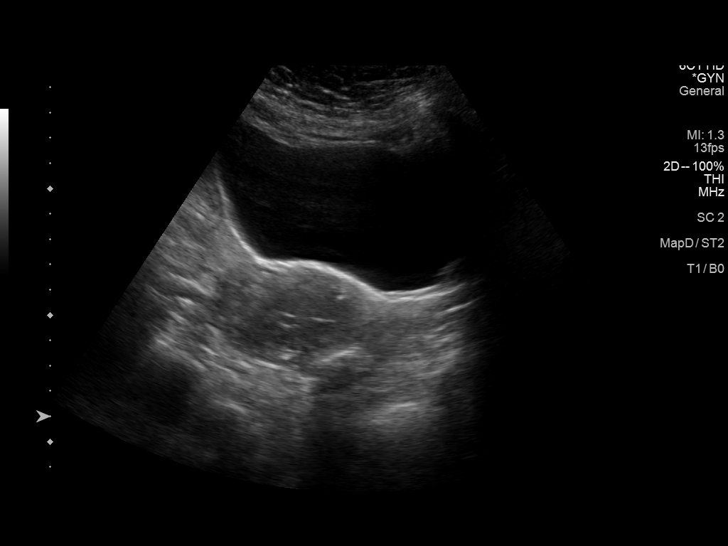
[im 5/49]
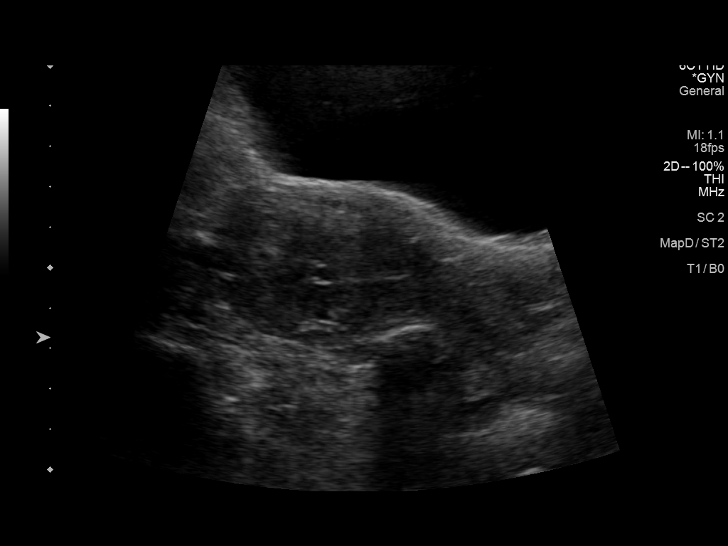
[im 9/49]
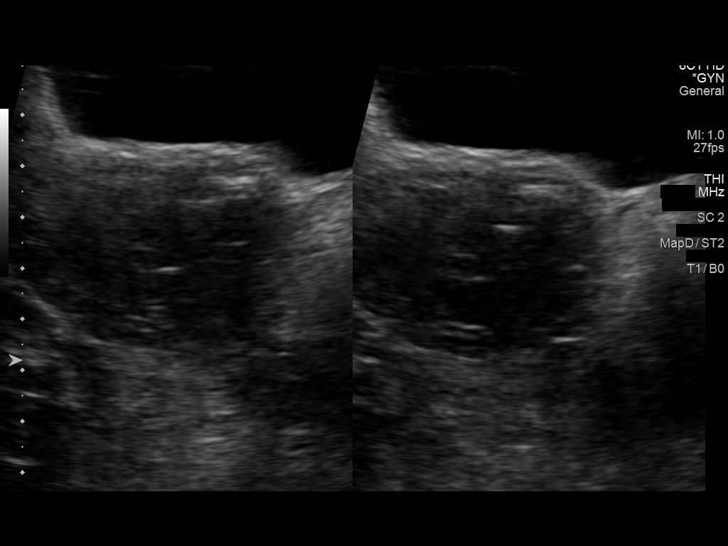
[im 13/49]
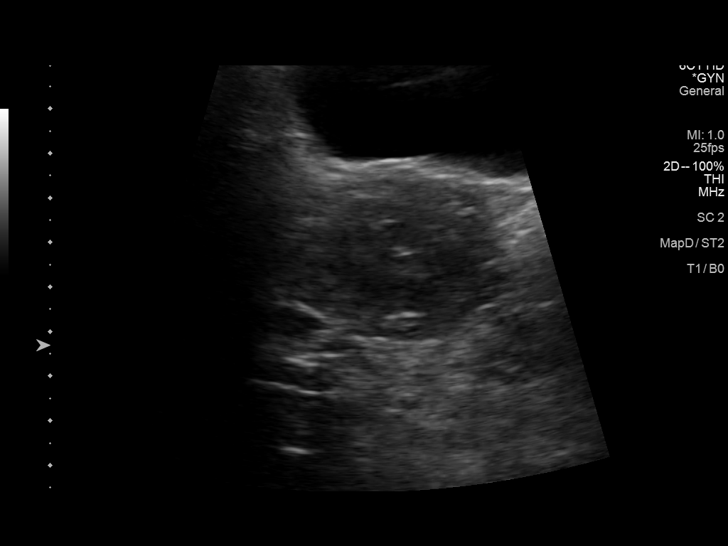
[im 17/49]
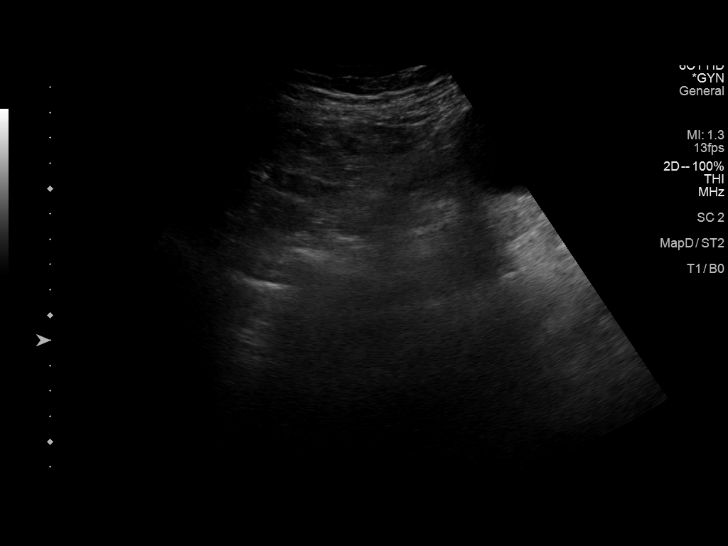
[im 21/49]
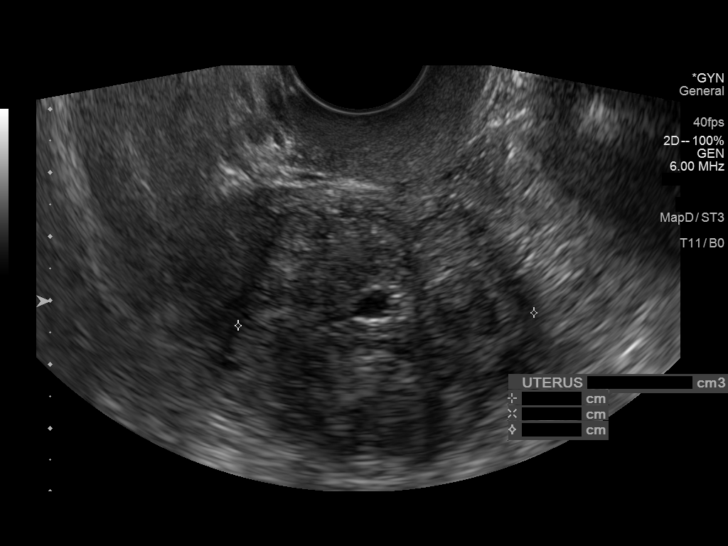
[im 25/49]
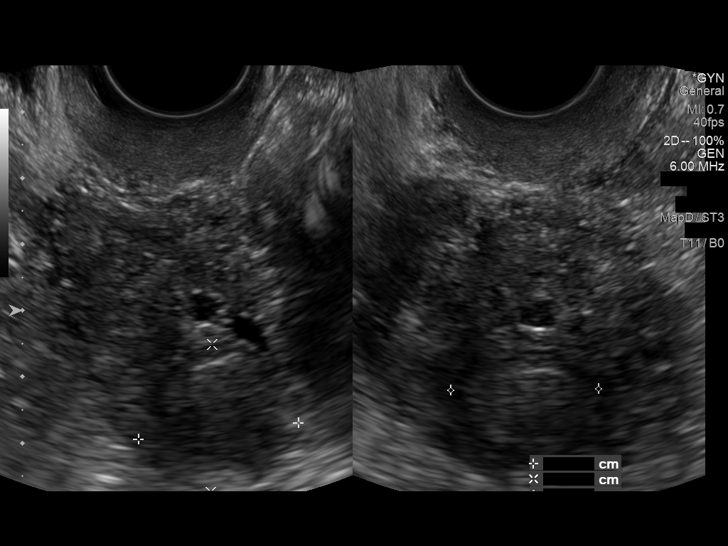
[im 29/49]
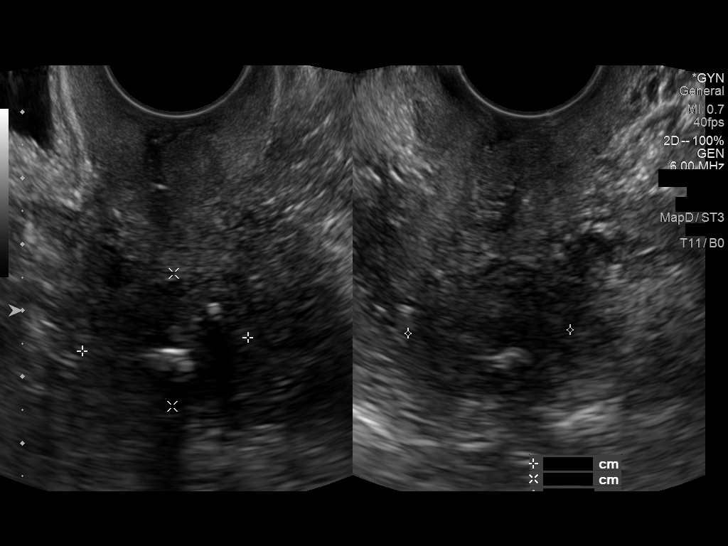
[im 33/49]
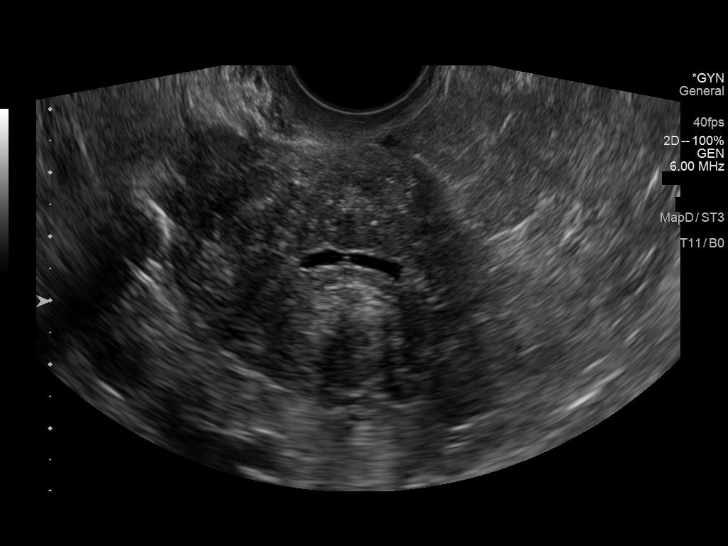
[im 37/49]
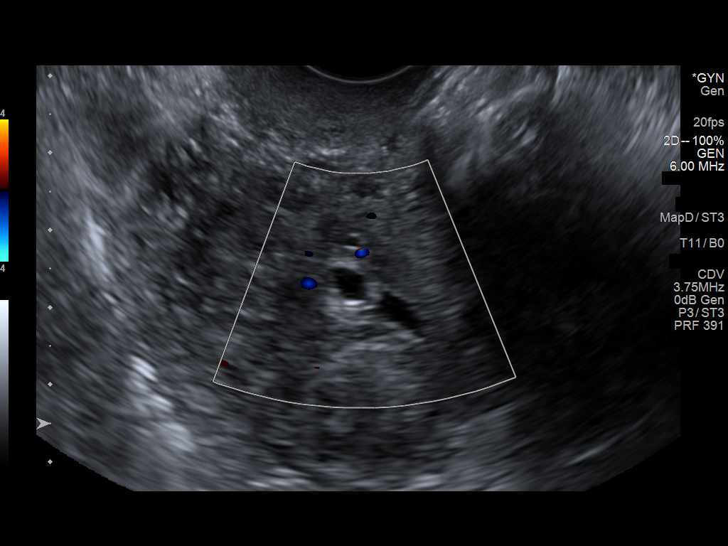
[im 41/49]
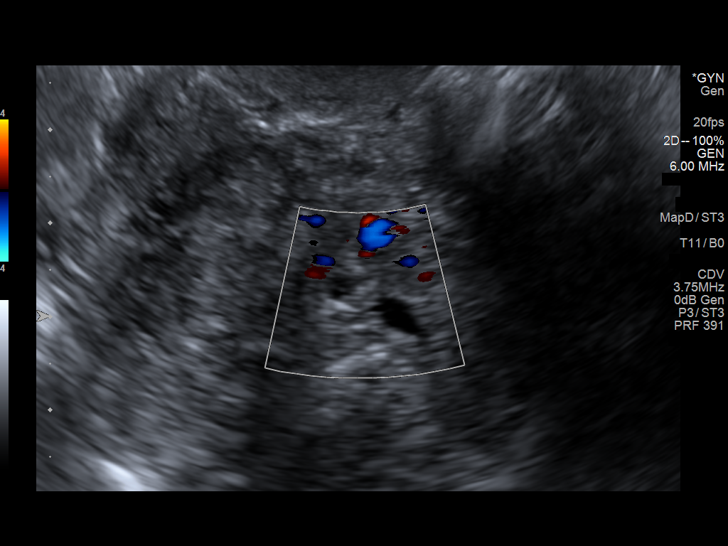
[im 45/49]
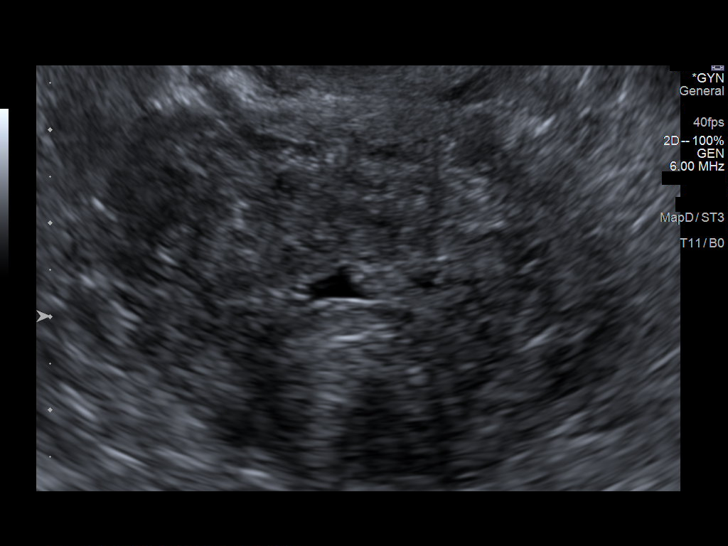
[im 49/49]
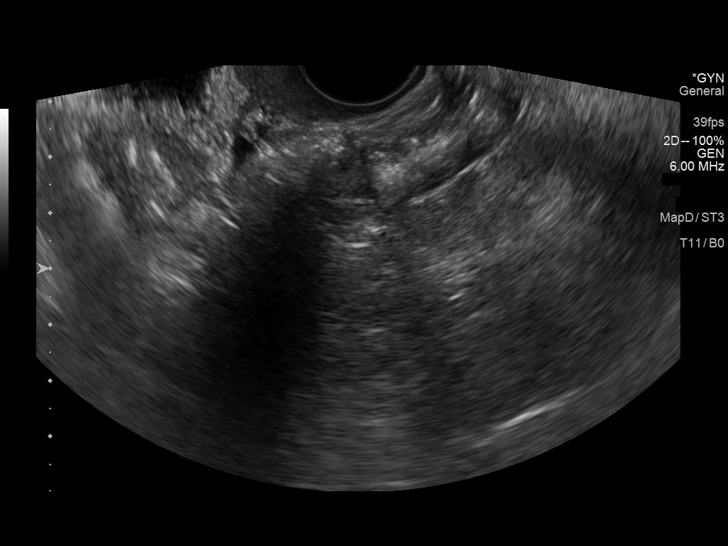

[13 of 25 positions shown; findings below may reference images not displayed]

FINDINGS: Uterus

Measurements: 8.5 x 4.2 x 5.3 cm = volume: 100 mL. Multiple uterine
masses presumably fibroids. Partially calcified anterior uterine
corpus mass measuring 2.5 x 2 x 2.5 cm. Lower uterine segment mass
measuring 2.2 x 2.5 x 2.2 cm. Fundal mass measuring 2.4 x 2.2 x
cm.

Endometrium

Thickness: 4 mm. Suspected 3.7 mm echogenic mass. Small fluid in
the endometrial canal

Right ovary

Surgically removed by patient report

Left ovary

Surgically removed by patient report.

Other findings

No abnormal free fluid.
IMPRESSION: 1. Multiple uterine fibroids.
2. Small fluid in the endometrial canal with 3.7 cm small echogenic
mass. A focal endometrial lesion is suspected. Consider
sonohysterogram for further evaluation, prior to hysteroscopy or
endometrial biopsy. Sonohysterography. AJR [id number]; [id number])
3. Nonvisualized ovaries

## 2022-03-24 ENCOUNTER — Telehealth: Payer: Self-pay | Admitting: Cardiology

## 2022-03-24 NOTE — Telephone Encounter (Signed)
Patient's PCP uses same system so can see results. Sent via secure chat to PCP as FYI.   Loel Dubonnet, NP

## 2022-03-24 NOTE — Telephone Encounter (Signed)
Patient states she received heart monitor results in the mail. She is requesting to have a copy sent to her PCP, Grier Mitts, MD if possible.

## 2022-04-02 ENCOUNTER — Telehealth: Payer: Self-pay | Admitting: Family Medicine

## 2022-04-02 NOTE — Telephone Encounter (Signed)
Spoke with patient to schedule Medicare Annual Wellness Visit (AWV) either virtually or in office.   Patient had death in family and wanted a call back week of 04/05/22  Last AWV  03/16/21 please schedule with Nurse Health Adviser   45 min for awv-i and in office appointments 30 min for awv-s  phone/virtual appointments

## 2022-04-06 ENCOUNTER — Telehealth: Payer: Self-pay | Admitting: Family Medicine

## 2022-04-06 NOTE — Telephone Encounter (Signed)
Left message for patient to call back and schedule Medicare Annual Wellness Visit (AWV) either virtually or in office. Left  my jabber number [phone]   Last AWV 03/16/21 please schedule with Nurse Health Adviser   45 min for awv-i and in office appointments 30 min for awv-s  phone/virtual appointments  

## 2022-04-14 DIAGNOSIS — K6389 Other specified diseases of intestine: Secondary | ICD-10-CM | POA: Diagnosis not present

## 2022-04-14 DIAGNOSIS — K5730 Diverticulosis of large intestine without perforation or abscess without bleeding: Secondary | ICD-10-CM | POA: Diagnosis not present

## 2022-04-14 DIAGNOSIS — K648 Other hemorrhoids: Secondary | ICD-10-CM | POA: Diagnosis not present

## 2022-04-14 DIAGNOSIS — D126 Benign neoplasm of colon, unspecified: Secondary | ICD-10-CM | POA: Diagnosis not present

## 2022-04-14 DIAGNOSIS — Z800 Family history of malignant neoplasm of digestive organs: Secondary | ICD-10-CM | POA: Diagnosis not present

## 2022-04-14 DIAGNOSIS — Z1211 Encounter for screening for malignant neoplasm of colon: Secondary | ICD-10-CM | POA: Diagnosis not present

## 2022-04-14 LAB — HM COLONOSCOPY

## 2022-04-29 ENCOUNTER — Encounter: Payer: Self-pay | Admitting: Family Medicine

## 2022-04-30 ENCOUNTER — Ambulatory Visit: Payer: Medicare PPO | Admitting: Family Medicine

## 2022-04-30 ENCOUNTER — Encounter: Payer: Self-pay | Admitting: Family Medicine

## 2022-04-30 VITALS — BP 138/69 | HR 78 | Temp 97.7°F | Ht 62.0 in | Wt 156.2 lb

## 2022-04-30 DIAGNOSIS — M5431 Sciatica, right side: Secondary | ICD-10-CM | POA: Diagnosis not present

## 2022-04-30 DIAGNOSIS — E782 Mixed hyperlipidemia: Secondary | ICD-10-CM | POA: Diagnosis not present

## 2022-04-30 DIAGNOSIS — M65341 Trigger finger, right ring finger: Secondary | ICD-10-CM

## 2022-04-30 DIAGNOSIS — I10 Essential (primary) hypertension: Secondary | ICD-10-CM

## 2022-04-30 LAB — LIPID PANEL
Cholesterol: 204 mg/dL — ABNORMAL HIGH (ref 0–200)
HDL: 55.2 mg/dL (ref >39.00)
LDL Cholesterol: 132 mg/dL — ABNORMAL HIGH (ref 0–99)
NonHDL: 148.52
Total CHOL/HDL Ratio: 4
Triglycerides: 84 mg/dL (ref 0.0–149.0)
VLDL: 16.8 mg/dL (ref 0.0–40.0)

## 2022-04-30 NOTE — Progress Notes (Signed)
Subjective:    Patient ID: Kristy Becker, female    DOB: [birth year], 70 y.o.   MRN: [id number]  Chief Complaint  Patient presents with   Acute Visit    Pt c/o sciatic pain in buttock and legs started yesterday.    HPI Patient is a 71 year old female with pmh sig for anxiety, HTN, glaucoma, eczema, allergies who was seen today for acute concerns.  Pt states she has been raking leaves in driveway x 2 wks.  Noticed a burning sensation from R buttock, down posterior R leg into ankle.  Sensation was brief, lasting seconds.  Was worried she could be having a stroke.  Has not occurred again.  Patient endorses right ring finger becoming stuck once bent x last 2 weeks.  Becoming worse.  Patient has to use her other hand to straighten the finger.  Patient requesting cholesterol be rechecked.  LDL mildly elevated 6 months ago at 115.  Past Medical History:  Diagnosis Date   ALLERGIC RHINITIS 10/06/2007   ANXIETY 01/31/2007   BACK PAIN 08/21/2007   COLONIC POLYPS, HX OF 02/26/2009   ECZEMA, HANDS 05/08/2007   Glaucoma    HYPERTENSION 01/31/2007    Allergies  Allergen Reactions   Azithromycin     nausea   Cephalosporins Other (See Comments)    unknown   Clarithromycin Other (See Comments)    unknown   Doxycycline Hyclate Other (See Comments)   Penicillins Hives    ROS General: Denies fever, chills, night sweats, changes in weight, changes in appetite HEENT: Denies headaches, ear pain, changes in vision, rhinorrhea, sore throat CV: Denies CP, palpitations, SOB, orthopnea Pulm: Denies SOB, cough, wheezing GI: Denies abdominal pain, nausea, vomiting, diarrhea, constipation GU: Denies dysuria, hematuria, frequency, vaginal discharge Msk: Denies muscle cramps, joint pains + pain of right fourth digit Neuro: Denies weakness, numbness, tingling  + burning sensation posterior right leg-now resolved Skin: Denies rashes, bruising Psych: Denies depression, anxiety, hallucinations       Objective:    Blood pressure 138/69, pulse 78, temperature 97.7 F (36.5 C), temperature source Temporal, height '5\' 2"'$  (1.575 m), weight 156 lb 3.2 oz (70.9 kg), SpO2 95 %.  Gen. Pleasant, well-nourished, in no distress, normal affect   HEENT: /AT, face symmetric, conjunctiva clear, no scleral icterus, PERRLA, EOMI, nares patent without drainage Lungs: no accessory muscle use Cardiovascular: RRR, no peripheral edema Musculoskeletal: Right fourth finger triggering with flexion.  Firm nodule appreciated at MCP joint.  No TTP of cervical, thoracic, lumbar spine, paraspinal muscles, bilateral sciatic nerves.  No deformities, no cyanosis or clubbing, normal tone Neuro:  A&Ox3, CN II-XII intact, normal gait Skin:  Warm, no lesions/ rash   Wt Readings from Last 3 Encounters:  04/30/22 156 lb 3.2 oz (70.9 kg)  12/10/21 160 lb 6.4 oz (72.8 kg)  10/27/21 165 lb 11.2 oz (75.2 kg)    Lab Results  Component Value Date   WBC 5.0 10/21/2021   HGB 14.1 10/21/2021   HCT 41.6 10/21/2021   PLT 260.0 10/21/2021   GLUCOSE 96 10/21/2021   CHOL 190 12/11/2021   TRIG 85.0 12/11/2021   HDL 58.80 12/11/2021   LDLDIRECT 143.7 02/20/2010   LDLCALC 115 (H) 12/11/2021   ALT 20 10/21/2021   AST 17 10/21/2021   NA 138 10/21/2021   K 4.5 10/21/2021   CL 101 10/21/2021   CREATININE 0.81 10/21/2021   BUN 19 10/21/2021   CO2 30 10/21/2021   TSH 0.37 10/21/2021   INR 1.01  09/29/2009   HGBA1C 5.6 10/21/2021    Assessment/Plan:  Trigger ring finger of right hand -Discussed treatment options including trigger point injection.  Patient declines as was advised not to have steroids 2/2 history of glaucoma.  Patient will check with her ophthalmologist in regards to this. -Given handout -Will send to hand surgery for injection if okayed by ophthalmology.  Sciatica of right side -Resolved -Discussed stretching and other supportive care -Given handout and exercises -Continue to monitor  Mixed  hyperlipidemia -LDL mildly elevated at 119 on 12/11/2021 - Plan: Lipid panel  Essential hypertension -Typically elevated in the clinic 2/2 anxiety -Recheck BP, 138/69 -Controlled at home -Continue current medication including benazepril-hydrochlorothiazide 20-12.5 mg daily -Continue lifestyle modifications  F/u as needed  Grier Mitts, MD

## 2022-05-04 ENCOUNTER — Telehealth: Payer: Self-pay | Admitting: Family Medicine

## 2022-05-04 NOTE — Telephone Encounter (Signed)
Left message for patient to call back and schedule Medicare Annual Wellness Visit (AWV) either virtually or in office. Left  my Herbie Drape number [phone]   Last AWV 03/16/21 please schedule with Nurse Health Adviser   45 min for awv-i and in office appointments 30 min for awv-s  phone/virtual appointments

## 2022-05-17 DIAGNOSIS — H018 Other specified inflammations of eyelid: Secondary | ICD-10-CM | POA: Diagnosis not present

## 2022-05-17 DIAGNOSIS — H04123 Dry eye syndrome of bilateral lacrimal glands: Secondary | ICD-10-CM | POA: Diagnosis not present

## 2022-05-24 DIAGNOSIS — Z01419 Encounter for gynecological examination (general) (routine) without abnormal findings: Secondary | ICD-10-CM | POA: Diagnosis not present

## 2022-05-24 DIAGNOSIS — D259 Leiomyoma of uterus, unspecified: Secondary | ICD-10-CM | POA: Diagnosis not present

## 2022-05-24 DIAGNOSIS — Z789 Other specified health status: Secondary | ICD-10-CM | POA: Diagnosis not present

## 2022-05-24 DIAGNOSIS — N952 Postmenopausal atrophic vaginitis: Secondary | ICD-10-CM | POA: Diagnosis not present

## 2022-06-03 ENCOUNTER — Other Ambulatory Visit: Payer: Self-pay | Admitting: Family Medicine

## 2022-06-03 DIAGNOSIS — H401133 Primary open-angle glaucoma, bilateral, severe stage: Secondary | ICD-10-CM | POA: Diagnosis not present

## 2022-06-25 ENCOUNTER — Ambulatory Visit (INDEPENDENT_AMBULATORY_CARE_PROVIDER_SITE_OTHER): Payer: Medicare PPO

## 2022-06-25 VITALS — Ht 62.0 in | Wt 156.0 lb

## 2022-06-25 DIAGNOSIS — Z0000 Encounter for general adult medical examination without abnormal findings: Secondary | ICD-10-CM

## 2022-06-25 DIAGNOSIS — Z1231 Encounter for screening mammogram for malignant neoplasm of breast: Secondary | ICD-10-CM | POA: Diagnosis not present

## 2022-06-25 NOTE — Patient Instructions (Addendum)
Kristy Becker , Thank you for taking time to come for your Medicare Wellness Visit. I appreciate your ongoing commitment to your health goals. Please review the following plan we discussed and let me know if I can assist you in the future.   These are the goals we discussed:  Goals       DIET - INCREASE WATER INTAKE      I will drink my water intake daily       Increase physical activity (pt-stated)      I want to get out more.      Patient Stated      I will continue to take my medications as prescribed        This is a list of the screening recommended for you and due dates:  Health Maintenance  Topic Date Due   DTaP/Tdap/Td vaccine (2 - Tdap) 09/13/2019   Mammogram  09/17/2021   COVID-19 Vaccine (5 - 2023-24 season) 07/11/2022*   Hepatitis C Screening: USPSTF Recommendation to screen - Ages 18-79 yo.  06/26/2023*   Medicare Annual Wellness Visit  06/26/2023   Colon Cancer Screening  04/14/2032   Pneumonia Vaccine  Completed   Flu Shot  Completed   DEXA scan (bone density measurement)  Completed   HPV Vaccine  Aged Out   Zoster (Shingles) Vaccine  Discontinued  *Topic was postponed. The date shown is not the original due date.    Advanced directives: Please bring a copy of your health care power of attorney and living will to the office to be added to your chart at your convenience.   Conditions/risks identified: None  Next appointment: Follow up in one year for your annual wellness visit     Preventive Care 65 Years and Older, Female Preventive care refers to lifestyle choices and visits with your health care provider that can promote health and wellness. What does preventive care include? A yearly physical exam. This is also called an annual well check. Dental exams once or twice a year. Routine eye exams. Ask your health care provider how often you should have your eyes checked. Personal lifestyle choices, including: Daily care of your teeth and gums. Regular  physical activity. Eating a healthy diet. Avoiding tobacco and drug use. Limiting alcohol use. Practicing safe sex. Taking low-dose aspirin every day. Taking vitamin and mineral supplements as recommended by your health care provider. What happens during an annual well check? The services and screenings done by your health care provider during your annual well check will depend on your age, overall health, lifestyle risk factors, and family history of disease. Counseling  Your health care provider may ask you questions about your: Alcohol use. Tobacco use. Drug use. Emotional well-being. Home and relationship well-being. Sexual activity. Eating habits. History of falls. Memory and ability to understand (cognition). Work and work Statistician. Reproductive health. Screening  You may have the following tests or measurements: Height, weight, and BMI. Blood pressure. Lipid and cholesterol levels. These may be checked every 5 years, or more frequently if you are over 50 years old. Skin check. Lung cancer screening. You may have this screening every year starting at age 42 if you have a 30-pack-year history of smoking and currently smoke or have quit within the past 15 years. Fecal occult blood test (FOBT) of the stool. You may have this test every year starting at age 72. Flexible sigmoidoscopy or colonoscopy. You may have a sigmoidoscopy every 5 years or a colonoscopy every 10 years  starting at age 49. Hepatitis C blood test. Hepatitis B blood test. Sexually transmitted disease (STD) testing. Diabetes screening. This is done by checking your blood sugar (glucose) after you have not eaten for a while (fasting). You may have this done every 1-3 years. Bone density scan. This is done to screen for osteoporosis. You may have this done starting at age 64. Mammogram. This may be done every 1-2 years. Talk to your health care provider about how often you should have regular mammograms. Talk  with your health care provider about your test results, treatment options, and if necessary, the need for more tests. Vaccines  Your health care provider may recommend certain vaccines, such as: Influenza vaccine. This is recommended every year. Tetanus, diphtheria, and acellular pertussis (Tdap, Td) vaccine. You may need a Td booster every 10 years. Zoster vaccine. You may need this after age 19. Pneumococcal 13-valent conjugate (PCV13) vaccine. One dose is recommended after age 57. Pneumococcal polysaccharide (PPSV23) vaccine. One dose is recommended after age 58. Talk to your health care provider about which screenings and vaccines you need and how often you need them. This information is not intended to replace advice given to you by your health care provider. Make sure you discuss any questions you have with your health care provider. Document Released: 06/27/2015 Document Revised: 02/18/2016 Document Reviewed: 04/01/2015 Elsevier Interactive Patient Education  2017 [place] Prevention in the Home Falls can cause injuries. They can happen to people of all ages. There are many things you can do to make your home safe and to help prevent falls. What can I do on the outside of my home? Regularly fix the edges of walkways and driveways and fix any cracks. Remove anything that might make you trip as you walk through a door, such as a raised step or threshold. Trim any bushes or trees on the path to your home. Use bright outdoor lighting. Clear any walking paths of anything that might make someone trip, such as rocks or tools. Regularly check to see if handrails are loose or broken. Make sure that both sides of any steps have handrails. Any raised decks and porches should have guardrails on the edges. Have any leaves, snow, or ice cleared regularly. Use sand or salt on walking paths during winter. Clean up any spills in your garage right away. This includes oil or grease  spills. What can I do in the bathroom? Use night lights. Install grab bars by the toilet and in the tub and shower. Do not use towel bars as grab bars. Use non-skid mats or decals in the tub or shower. If you need to sit down in the shower, use a plastic, non-slip stool. Keep the floor dry. Clean up any water that spills on the floor as soon as it happens. Remove soap buildup in the tub or shower regularly. Attach bath mats securely with double-sided non-slip rug tape. Do not have throw rugs and other things on the floor that can make you trip. What can I do in the bedroom? Use night lights. Make sure that you have a light by your bed that is easy to reach. Do not use any sheets or blankets that are too big for your bed. They should not hang down onto the floor. Have a firm chair that has side arms. You can use this for support while you get dressed. Do not have throw rugs and other things on the floor that can make you trip. What  can I do in the kitchen? Clean up any spills right away. Avoid walking on wet floors. Keep items that you use a lot in easy-to-reach places. If you need to reach something above you, use a strong step stool that has a grab bar. Keep electrical cords out of the way. Do not use floor polish or wax that makes floors slippery. If you must use wax, use non-skid floor wax. Do not have throw rugs and other things on the floor that can make you trip. What can I do with my stairs? Do not leave any items on the stairs. Make sure that there are handrails on both sides of the stairs and use them. Fix handrails that are broken or loose. Make sure that handrails are as long as the stairways. Check any carpeting to make sure that it is firmly attached to the stairs. Fix any carpet that is loose or worn. Avoid having throw rugs at the top or bottom of the stairs. If you do have throw rugs, attach them to the floor with carpet tape. Make sure that you have a light switch at the  top of the stairs and the bottom of the stairs. If you do not have them, ask someone to add them for you. What else can I do to help prevent falls? Wear shoes that: Do not have high heels. Have rubber bottoms. Are comfortable and fit you well. Are closed at the toe. Do not wear sandals. If you use a stepladder: Make sure that it is fully opened. Do not climb a closed stepladder. Make sure that both sides of the stepladder are locked into place. Ask someone to hold it for you, if possible. Clearly mark and make sure that you can see: Any grab bars or handrails. First and last steps. Where the edge of each step is. Use tools that help you move around (mobility aids) if they are needed. These include: Canes. Walkers. Scooters. Crutches. Turn on the lights when you go into a dark area. Replace any light bulbs as soon as they burn out. Set up your furniture so you have a clear path. Avoid moving your furniture around. If any of your floors are uneven, fix them. If there are any pets around you, be aware of where they are. Review your medicines with your doctor. Some medicines can make you feel dizzy. This can increase your chance of falling. Ask your doctor what other things that you can do to help prevent falls. This information is not intended to replace advice given to you by your health care provider. Make sure you discuss any questions you have with your health care provider. Document Released: 03/27/2009 Document Revised: 11/06/2015 Document Reviewed: 07/05/2014 Elsevier Interactive Patient Education  2017 [employer].

## 2022-06-25 NOTE — Progress Notes (Signed)
Subjective:   Kristy Becker is a 72 y.o. female who presents for Medicare Annual (Subsequent) preventive examination.  Review of Systems    Virtual Visit via Telephone Note  I connected with  Kristy Becker on 06/25/22 at 11:15 AM EST by telephone and verified that I am speaking with the correct person using two identifiers.  Location: Patient: Home Provider: Office Persons participating in the virtual visit: patient/Nurse Health Advisor   I discussed the limitations, risks, security and privacy concerns of performing an evaluation and management service by telephone and the availability of in person appointments. The patient expressed understanding and agreed to proceed.  Interactive audio and video telecommunications were attempted between this nurse and patient, however failed, due to patient having technical difficulties OR patient did not have access to video capability.  We continued and completed visit with audio only.  Some vital signs may be absent or patient reported.   [name], LPN  Cardiac Risk Factors include: advanced age (>48mn, >>42women);hypertension     Objective:    Today's Vitals   06/25/22 1047  Weight: 156 lb (70.8 kg)  Height: '5\' 2"'$  (1.575 m)   Body mass index is 28.53 kg/m.     06/25/2022   10:54 AM 01/09/2020    8:23 AM 12/07/2016    1:50 PM  Advanced Directives  Does Patient Have a Medical Advance Directive? Yes Yes No  Type of [place] will [place] will   Does patient want to make changes to medical advance directive?  No - Patient declined   Copy of [place] Chart? No - copy requested No - copy requested   Would patient like information on creating a medical advance directive?   No - Patient declined    Current Medications (verified) Outpatient Encounter Medications as of 06/25/2022  Medication Sig   azelastine (ASTELIN) 0.1 %  nasal spray 2 (two) times daily as needed   benazepril-hydrochlorthiazide (LOTENSIN HCT) 20-12.5 MG tablet TAKE 1 TABLET BY MOUTH EVERY DAY   cetirizine (ZYRTEC) 10 MG tablet Take 10 mg by mouth daily.   cyclobenzaprine (FLEXERIL) 5 MG tablet Take 1 tablet (5 mg total) by mouth at bedtime as needed for muscle spasms. (Patient not taking: Reported on 04/30/2022)   diclofenac Sodium (VOLTAREN) 1 % GEL Apply 2 g topically daily as needed. Rub into affected area of foot 2 to 4 times daily   famotidine (PEPCID) 20 MG tablet Take 20 mg by mouth 2 (two) times daily. OTC   hydrOXYzine (ATARAX/VISTARIL) 25 MG tablet    MOMETASONE FUROATE NA Place 2 sprays into the nose daily. 50 mcg   Multiple Vitamin (MULTIVITAMIN WITH MINERALS) TABS Take 1 tablet by mouth daily.   Multiple Vitamins-Minerals (CENTRUM SILVER 50+WOMEN) TABS Take by mouth daily. OTC   Probiotic Product (PROBIOTIC DAILY PO) Take by mouth.   RESTASIS 0.05 % ophthalmic emulsion    No facility-administered encounter medications on file as of 06/25/2022.    Allergies (verified) Azithromycin, Cephalosporins, Clarithromycin, Doxycycline hyclate, and Penicillins   History: Past Medical History:  Diagnosis Date   ALLERGIC RHINITIS 10/06/2007   ANXIETY 01/31/2007   BACK PAIN 08/21/2007   COLONIC POLYPS, HX OF 02/26/2009   ECZEMA, HANDS 05/08/2007   Glaucoma    HYPERTENSION 01/31/2007   Past Surgical History:  Procedure Laterality Date   TONSILLECTOMY AND ADENOIDECTOMY     History reviewed. No pertinent family history. Social  History   Socioeconomic History   Marital status: Married    Spouse name: Not on file   Number of children: Not on file   Years of education: Not on file   Highest education level: Not on file  Occupational History   Not on file  Tobacco Use   Smoking status: Never   Smokeless tobacco: Never  Substance and Sexual Activity   Alcohol use: No   Drug use: No   Sexual activity: Not on file  Other Topics  Concern   Not on file  Social History Narrative   Not on file   Social Determinants of Health   Financial Resource Strain: Low Risk  (06/25/2022)   Overall Financial Resource Strain (CARDIA)    Difficulty of Paying Living Expenses: Not hard at all  Food Insecurity: No Food Insecurity (06/25/2022)   Hunger Vital Sign    Worried About Running Out of Food in the Last Year: Never true    Ran Out of Food in the Last Year: Never true  Transportation Needs: No Transportation Needs (01/09/2020)   PRAPARE - Hydrologist (Medical): No    Lack of Transportation (Non-Medical): No  Physical Activity: Insufficiently Active (06/25/2022)   Exercise Vital Sign    Days of Exercise per Week: 2 days    Minutes of Exercise per Session: 30 min  Stress: No Stress Concern Present (06/25/2022)   [place]    Feeling of Stress : Not at all  Social Connections: Moderately Integrated (06/25/2022)   Social Connection and Isolation Panel [NHANES]    Frequency of Communication with Friends and Family: More than three times a week    Frequency of Social Gatherings with Friends and Family: More than three times a week    Attends Religious Services: More than 4 times per year    Active Member of [employer] or Organizations: Yes    Attends Archivist Meetings: More than 4 times per year    Marital Status: Widowed    Tobacco Counseling Counseling given: Not Answered   Clinical Intake:  Pre-visit preparation completed: No  Pain : No/denies pain     BMI - recorded: 28.53 Nutritional Status: BMI 25 -29 Overweight Nutritional Risks: None Diabetes: No  How often do you need to have someone help you when you read instructions, pamphlets, or other written materials from your doctor or pharmacy?: 1 - Never  Diabetic?  No  Interpreter Needed?: No  Information entered by :: Rolene Arbour LPN   Activities of  Daily Living    06/25/2022   10:52 AM  In your present state of health, do you have any difficulty performing the following activities:  Hearing? 0  Vision? 0  Difficulty concentrating or making decisions? 0  Walking or climbing stairs? 0  Dressing or bathing? 0  Doing errands, shopping? 0  Preparing Food and eating ? N  Using the Toilet? N  In the past six months, have you accidently leaked urine? N  Do you have problems with loss of bowel control? N  Managing your Medications? N  Managing your Finances? N  Housekeeping or managing your Housekeeping? N    Patient Care Team: Billie Ruddy, MD as PCP - General (Family Medicine) Buford Dresser, MD as PCP - Cardiology (Cardiology)  Indicate any recent Medical Services you may have received from other than [facility] providers in the past year (date may be approximate).  Assessment:   This is a routine wellness examination for Kristy Becker.  Hearing/Vision screen Hearing Screening - Comments:: Denies hearing difficulties   Vision Screening - Comments:: Wears rx glasses - up to date with routine eye exams with  [facility]  Dietary issues and exercise activities discussed: Exercise limited by: None identified   Goals Addressed               This Visit's Progress     Increase physical activity (pt-stated)        I want to get out more.       Depression Screen    06/25/2022   10:51 AM 04/30/2022   10:45 AM 04/30/2022    9:25 AM 12/10/2021   10:32 AM 10/21/2021    9:09 AM 03/16/2021   10:37 AM 08/25/2020   10:06 PM  PHQ 2/9 Scores  PHQ - 2 Score 0 1 0 0 '2 1 4  '$ PHQ- 9 Score 0 1  0 '3 1 8    '$ Fall Risk    06/25/2022   10:52 AM 04/30/2022    9:24 AM 12/10/2021   10:32 AM 10/21/2021    9:09 AM 03/16/2021   10:10 AM  Fall Risk   Falls in the past year? 0 0 0 0 0  Number falls in past yr: 0 0 0 0   Injury with Fall? 0 0 0 0   Risk for fall due to : No Fall Risks  No Fall Risks No Fall Risks   Follow up Falls  prevention discussed  Falls evaluation completed Falls evaluation completed     [place]:  Any stairs in or around the home? No If so, are there any without handrails? No  Home free of loose throw rugs in walkways, pet beds, electrical cords, etc? Yes  Adequate lighting in your home to reduce risk of falls? Yes   ASSISTIVE DEVICES UTILIZED TO PREVENT FALLS:  Life alert? No  Use of a cane, walker or w/c? No  Grab bars in the bathroom? Yes  Shower chair or bench in shower? No  Elevated toilet seat or a handicapped toilet? No   TIMED UP AND GO:  Was the test performed? No . Audio Visit  Cognitive Function:        06/25/2022   10:54 AM 01/09/2020    8:31 AM  6CIT Screen  What Year? 0 points 0 points  What month? 0 points 0 points  What time? 0 points 0 points  Count back from 20 2 points 0 points  Months in reverse 2 points 0 points  Repeat phrase 0 points 4 points  Total Score 4 points 4 points    Immunizations Immunization History  Administered Date(s) Administered   Fluad Quad(high Dose 65+) 04/26/2019, 02/23/2021, 04/23/2022   Influenza, High Dose Seasonal PF 04/11/2015, 04/15/2016, 08/31/2016, 03/31/2017, 08/30/2017, 05/01/2018, 06/28/2019   Influenza,inj,Quad PF,6+ Mos 04/06/2013, 04/08/2014   Influenza-Unspecified 03/14/2016   PFIZER(Purple Top)SARS-COV-2 Vaccination 07/04/2019, 07/25/2019, 04/15/2020, 01/21/2021   Pneumococcal Conjugate-13 05/28/2015   Pneumococcal Polysaccharide-23 06/02/2016, 08/31/2016, 08/30/2017, 04/19/2018, 04/24/2019, 06/28/2019   Td 09/12/2009   Zoster Recombinat (Shingrix) 06/23/2021    TDAP status: Due, Education has been provided regarding the importance of this vaccine. Advised may receive this vaccine at local pharmacy or Health Dept. Aware to provide a copy of the vaccination record if obtained from local pharmacy or Health Dept. Verbalized acceptance and understanding.  Flu Vaccine status: Up  to date  Pneumococcal vaccine status: Up to date  Covid-19 vaccine status: Completed vaccines  Qualifies for Shingles Vaccine? Yes   Zostavax completed Yes   Shingrix Completed?: Yes  Screening Tests Health Maintenance  Topic Date Due   DTaP/Tdap/Td (2 - Tdap) 09/13/2019   MAMMOGRAM  09/17/2021   COVID-19 Vaccine (5 - 2023-24 season) 07/11/2022 (Originally 02/12/2022)   Hepatitis C Screening  06/26/2023 (Originally 09/29/1968)   Medicare Annual Wellness (AWV)  06/26/2023   COLONOSCOPY (Pts 45-3yr Insurance coverage will need to be confirmed)  04/14/2032   Pneumonia Vaccine 72 Years old  Completed   INFLUENZA VACCINE  Completed   DEXA SCAN  Completed   HPV VACCINES  Aged Out   Zoster Vaccines- Shingrix  Discontinued    Health Maintenance  Health Maintenance Due  Topic Date Due   DTaP/Tdap/Td (2 - Tdap) 09/13/2019   MAMMOGRAM  09/17/2021    Colorectal cancer screening: Type of screening: Colonoscopy. Completed 04/14/22. Repeat every 10 years  Mammogram status: Ordered 06/25/22. Pt provided with contact info and advised to call to schedule appt.   Bone Density status: Completed 11/11/15. Results reflect: Bone density results: OSTEOPOROSIS. Repeat every   years.  Lung Cancer Screening: (Low Dose CT Chest recommended if Age 462-80years, 30 pack-year currently smoking OR have quit w/in 15years.) does not qualify.   Additional Screening:  Hepatitis C Screening: does qualify; Completed Deferred  Vision Screening: Recommended annual ophthalmology exams for early detection of glaucoma and other disorders of the eye. Is the patient up to date with their annual eye exam?  Yes  Who is the provider or what is the name of the office in which the patient attends annual eye exams? [facility] is not established with a provider, would they like to be referred to a provider to establish care? No .   Dental Screening: Recommended annual dental exams for proper oral  hygiene  Community Resource Referral / Chronic Care Management:  CRR required this visit?  No   CCM required this visit?  No      Plan:     I have personally reviewed and noted the following in the patient's chart:   Medical and social history Use of alcohol, tobacco or illicit drugs  Current medications and supplements including opioid prescriptions. Patient is not currently taking opioid prescriptions. Functional ability and status Nutritional status Physical activity Advanced directives List of other physicians Hospitalizations, surgeries, and ER visits in previous 12 months Vitals Screenings to include cognitive, depression, and falls Referrals and appointments  In addition, I have reviewed and discussed with patient certain preventive protocols, quality metrics, and best practice recommendations. A written personalized care plan for preventive services as well as general preventive health recommendations were provided to patient.     BCriselda Peaches LPN   15/17/0017  Nurse Notes: Patient due Hep-C Screening

## 2022-07-08 ENCOUNTER — Telehealth (INDEPENDENT_AMBULATORY_CARE_PROVIDER_SITE_OTHER): Payer: Medicare PPO | Admitting: Family Medicine

## 2022-07-08 ENCOUNTER — Encounter: Payer: Self-pay | Admitting: Family Medicine

## 2022-07-08 VITALS — Ht 62.0 in | Wt 156.0 lb

## 2022-07-08 DIAGNOSIS — U071 COVID-19: Secondary | ICD-10-CM | POA: Diagnosis not present

## 2022-07-08 MED ORDER — MOLNUPIRAVIR EUA 200MG CAPSULE: 4.0000 | ORAL_CAPSULE | Freq: Two times a day (BID) | ORAL | 0 refills | Status: AC

## 2022-07-08 NOTE — Progress Notes (Signed)
Virtual Visit via Video Note  I connected with Kristy Becker on 07/08/22 at 10:45 AM EST by a video enabled telemedicine application 2/2 [id number] pandemic and verified that I am speaking with the correct person using two identifiers.  Location patient: home Location provider:work or home office Persons participating in the virtual visit: patient, provider  I discussed the limitations of evaluation and management by telemedicine and the availability of in person appointments. The patient expressed understanding and agreed to proceed. Chief Complaint  Patient presents with   Covid Exposure    Pt reports her son who lives with her has covid yesterday. She tested today with faint line. Reports sx of chills, coughing here and there , feeling tired. Denied body ache, headache. Sx started on Tuesday. She states she is feeling okay now.      HPI:  Pt states she was fine Tuesday day, but that evening started getting a chill.  Has some soreness in top of her head, nasal congestion, decreased appetite.  Symptoms feel like her allergies.  Had positive at home COVID test this am.  Denies ST, cough, diarrhea.  Sick contacts include pt's son who has [place].     ROS: See pertinent positives and negatives per HPI.  Past Medical History:  Diagnosis Date   ALLERGIC RHINITIS 10/06/2007   ANXIETY 01/31/2007   BACK PAIN 08/21/2007   COLONIC POLYPS, HX OF 02/26/2009   ECZEMA, HANDS 05/08/2007   Glaucoma    HYPERTENSION 01/31/2007    Past Surgical History:  Procedure Laterality Date   TONSILLECTOMY AND ADENOIDECTOMY      No family history on file.  Current Outpatient Medications:    azelastine (ASTELIN) 0.1 % nasal spray, 2 (two) times daily as needed, Disp: , Rfl:    benazepril-hydrochlorthiazide (LOTENSIN HCT) 20-12.5 MG tablet, TAKE 1 TABLET BY MOUTH EVERY DAY, Disp: 90 tablet, Rfl: 1   cetirizine (ZYRTEC) 10 MG tablet, Take 10 mg by mouth daily., Disp: , Rfl:    diclofenac Sodium (VOLTAREN) 1 %  GEL, Apply 2 g topically daily as needed. Rub into affected area of foot 2 to 4 times daily, Disp: 100 g, Rfl: 0   famotidine (PEPCID) 20 MG tablet, Take 20 mg by mouth 2 (two) times daily. OTC, Disp: , Rfl:    hydrOXYzine (ATARAX/VISTARIL) 25 MG tablet, , Disp: , Rfl:    molnupiravir EUA (LAGEVRIO) 200 mg CAPS capsule, Take 4 capsules (800 mg total) by mouth 2 (two) times daily for 5 days., Disp: 40 capsule, Rfl: 0   MOMETASONE FUROATE NA, Place 2 sprays into the nose daily. 50 mcg, Disp: , Rfl:    Multiple Vitamin (MULTIVITAMIN WITH MINERALS) TABS, Take 1 tablet by mouth daily., Disp: , Rfl:    Multiple Vitamins-Minerals (CENTRUM SILVER 50+WOMEN) TABS, Take by mouth daily. OTC, Disp: , Rfl:    Probiotic Product (PROBIOTIC DAILY PO), Take by mouth., Disp: , Rfl:    RESTASIS 0.05 % ophthalmic emulsion, , Disp: , Rfl:    cyclobenzaprine (FLEXERIL) 5 MG tablet, Take 1 tablet (5 mg total) by mouth at bedtime as needed for muscle spasms. (Patient not taking: Reported on 04/30/2022), Disp: 30 tablet, Rfl: 0  EXAM:  VITALS per patient if applicable: RR between 50-03 p.m.   GENERAL: alert, oriented, appears well and in no acute distress  HEENT: atraumatic, conjunctiva clear, no obvious abnormalities on inspection of external nose and ears  NECK: normal movements of the head and neck  LUNGS: on inspection no signs of  respiratory distress, breathing rate appears normal, no obvious gross SOB, gasping or wheezing  CV: no obvious cyanosis  MS: moves all visible extremities without noticeable abnormality  PSYCH/NEURO: pleasant and cooperative, no obvious depression or anxiety, speech and thought processing grossly intact  ASSESSMENT AND PLAN:  Discussed the following assessment and plan:  COVID-19 virus infection  -Symptoms starting 07/06/2022 with positive testing 07/08/2022 -Discussed r/b/a of antiviral medication.  Patient wishes to start med -Continue supportive care with OTC medications.   Advised to look around for patients with high blood pressure. -Given precautions - Plan: molnupiravir EUA (LAGEVRIO) 200 mg CAPS capsule  Follow-up for continued or worsening symptoms  I discussed the assessment and treatment plan with the patient. The patient was provided an opportunity to ask questions and all were answered. The patient agreed with the plan and demonstrated an understanding of the instructions.   The patient was advised to call back or seek an in-person evaluation if the symptoms worsen or if the condition fails to improve as anticipated.  Billie Ruddy, MD

## 2022-07-23 ENCOUNTER — Ambulatory Visit: Payer: Medicare PPO | Admitting: Family Medicine

## 2022-07-23 ENCOUNTER — Encounter: Payer: Self-pay | Admitting: Family Medicine

## 2022-07-23 VITALS — BP 134/70 | HR 75 | Temp 97.9°F | Ht 62.0 in | Wt 157.8 lb

## 2022-07-23 DIAGNOSIS — R0981 Nasal congestion: Secondary | ICD-10-CM | POA: Diagnosis not present

## 2022-07-23 DIAGNOSIS — J3089 Other allergic rhinitis: Secondary | ICD-10-CM | POA: Diagnosis not present

## 2022-07-23 MED ORDER — FEXOFENADINE HCL 60 MG PO TABS: 60.0000 mg | ORAL_TABLET | Freq: Every day | ORAL | 1 refills | Status: AC

## 2022-07-23 NOTE — Patient Instructions (Addendum)
Prescription for Allegra 60 mg was sent to your pharmacy.  It does not appear to have any interactions with your current medications and should not increase your eye pressure.  You can take this to help with your nasal congestion.  Steam shower also can help your symptoms.

## 2022-07-23 NOTE — Progress Notes (Signed)
   Established Patient Office Visit   Subjective  Patient ID: Kristy Becker, female    DOB: 09-11-1950  Age: 72 y.o. MRN: [id number]  Chief Complaint  Patient presents with   Nasal Congestion    Patient is a 72 year old female who presents for follow-up concern.  Patient diagnosed with COVID-19 virus infection on 07/16/2022.  Patient states she is feeling much better but still having some congestion.  Using the Nettie pot, Mucinex, Flonase and Ricola cough drops.  Patient endorses drainage becomes thick by the evening.  Denies facial pain/pressure, fever, ear pain/pressure      ROS Negative unless stated above    Objective:     BP 134/70 (BP Location: Right Arm, Patient Position: Sitting, Cuff Size: Large)   Pulse 75   Temp 97.9 F (36.6 C) (Oral)   Ht '5\' 2"'$  (1.575 m)   Wt 157 lb 12.8 oz (71.6 kg)   SpO2 98%   BMI 28.86 kg/m    Physical Exam Constitutional:      General: She is not in acute distress.    Appearance: Normal appearance.  HENT:     Head: Normocephalic and atraumatic.     Nose: Congestion present.     Mouth/Throat:     Mouth: Mucous membranes are moist.  Eyes:     Extraocular Movements: Extraocular movements intact.     Conjunctiva/sclera: Conjunctivae normal.     Pupils: Pupils are equal, round, and reactive to light.  Cardiovascular:     Rate and Rhythm: Normal rate.     Heart sounds: Normal heart sounds. No murmur heard.    No gallop.  Pulmonary:     Effort: Pulmonary effort is normal. No respiratory distress.     Breath sounds: Normal breath sounds. No wheezing, rhonchi or rales.  Skin:    General: Skin is warm and dry.  Neurological:     Mental Status: She is alert and oriented to person, place, and time.      No results found for any visits on 07/23/22.    Assessment & Plan:  Nasal congestion -     Fexofenadine HCl; Take 1 tablet (60 mg total) by mouth daily.  Dispense: 30 tablet; Refill: 1  Environmental and seasonal allergies -      Fexofenadine HCl; Take 1 tablet (60 mg total) by mouth daily.  Dispense: 30 tablet; Refill: 1  Pt recovering status post COVID-19 virus infection.  Discussed no need to continue retesting as may continue to test positive.  Continue netti pot, flonase.  Discussed other supportive care including steam from shower and OTC antihistamine.  Rx for Allegra sent to pharmacy.  Follow-up as needed for continued or worsening symptoms  No follow-ups on file.   Billie Ruddy, MD

## 2022-07-26 ENCOUNTER — Telehealth: Payer: Self-pay | Admitting: Family Medicine

## 2022-07-26 NOTE — Telephone Encounter (Signed)
Pt is asking for a call back to confirm the correct directions on how to take this medication.  Pt states she is unclear and all she needs is a call back, as soon as possible. Pt states she will not take it until CMA calls her back.   [id number]

## 2022-07-26 NOTE — Telephone Encounter (Signed)
Patient asking to speak with someone regarding her prescription for fexofenadine (ALLEGRA ALLERGY) 60 MG tablet

## 2022-07-27 NOTE — Telephone Encounter (Signed)
Patient called back into the office today she had a question around allegra dosage per Dr. Volanda Napoleon notes it was instructed that she take 1 pill by mouth daily. The patient stated she believes she was told to take 2 a day. She is calling for clarification.

## 2022-07-27 NOTE — Telephone Encounter (Signed)
Pt to take Allegra 60 mg one tab daily.

## 2022-07-28 NOTE — Telephone Encounter (Signed)
Spoke to pt. Pt is aware of Dr. Volanda Napoleon' advise below.   Pt states she is taking allegra 64m 1 tab at night. She said it seems to be helping. Continues that she doesn't know what it is, head congestion on what but she was doing something and had to sit down. Pt denied of feeling pressure in head or pain or feeling foggy. She wants to know if she should see her allergist. Also if her allergy causing her to have dry eyes. Pt states she has glaucoma and seeing [facility] for it.   Please advise.

## 2022-07-30 NOTE — Telephone Encounter (Signed)
Okay for pt to see allergist.  Allergies can cause dry eyes.  Make sure you are staying hydrated.

## 2022-08-02 NOTE — Telephone Encounter (Signed)
Attempt to reach pt. Left a voicemail to call us back.

## 2022-08-04 NOTE — Telephone Encounter (Signed)
Spoke to pt. Inform her of the message below from Dr. Volanda Napoleon.  Pt states she will contact her allergist to follow up with them.  Pt reports her congestion is getting better and wants to know if she needs to stay on allergra until it is clear up?  Pt recall a discussion from ov on 04/30/2022 about trigger finger. She states Dr. Volanda Napoleon was recommending for her to get a shot in her hand, it has steroid. Pt would like to know the name of the shot so she can follow up with eye doctor if it is okay for her to do so?  Please advise.

## 2022-08-09 ENCOUNTER — Other Ambulatory Visit: Payer: Self-pay | Admitting: Family Medicine

## 2022-08-09 DIAGNOSIS — J3089 Other allergic rhinitis: Secondary | ICD-10-CM

## 2022-08-09 DIAGNOSIS — R0981 Nasal congestion: Secondary | ICD-10-CM

## 2022-08-09 NOTE — Telephone Encounter (Signed)
Okay to stay on Allegra and congestion clears up.  The type of steroid used would be at the discretion of hand surgery or Ortho.  Likely Kenalog.

## 2022-08-11 NOTE — Telephone Encounter (Signed)
Attempt to reach pt. Left a vm to call us back.

## 2022-08-11 NOTE — Telephone Encounter (Signed)
Spoke to pt. Pt is inform of message below. Verbalized understanding.

## 2022-08-15 ENCOUNTER — Ambulatory Visit (INDEPENDENT_AMBULATORY_CARE_PROVIDER_SITE_OTHER): Payer: Medicare PPO

## 2022-08-15 ENCOUNTER — Ambulatory Visit (HOSPITAL_COMMUNITY)
Admission: RE | Admit: 2022-08-15 | Discharge: 2022-08-15 | Disposition: A | Discharge status: Home / Self Care | Payer: Medicare PPO | Source: Ambulatory Visit | Attending: Emergency Medicine | Admitting: Emergency Medicine

## 2022-08-15 ENCOUNTER — Encounter (HOSPITAL_COMMUNITY): Payer: Self-pay

## 2022-08-15 VITALS — BP 120/70 | HR 66 | Temp 98.2°F | Resp 16 | Ht 62.0 in | Wt 159.0 lb

## 2022-08-15 DIAGNOSIS — M79675 Pain in left toe(s): Secondary | ICD-10-CM

## 2022-08-15 DIAGNOSIS — S99922A Unspecified injury of left foot, initial encounter: Secondary | ICD-10-CM | POA: Diagnosis not present

## 2022-08-15 NOTE — ED Provider Notes (Signed)
[facility]    CSN: [id number] Arrival date & time: 08/15/22  1511      History   Chief Complaint Chief Complaint  Patient presents with   Appointment   Toe Injury    HPI Kristy Becker is a 72 y.o. female.   Presents for evaluation of left second toe pain beginning 1 day ago after tripping on the sidewalk.  Foot was folded backwards with toes against the pavement.  Is able to bear weight.  Has full range of motion.  Pain is present at the base of the toe.  Denies numbness or tingling.  Has attempted use of Advil with some relief.  Past Medical History:  Diagnosis Date   ALLERGIC RHINITIS 10/06/2007   ANXIETY 01/31/2007   BACK PAIN 08/21/2007   COLONIC POLYPS, HX OF 02/26/2009   ECZEMA, HANDS 05/08/2007   Glaucoma    HYPERTENSION 01/31/2007    Patient Active Problem List   Diagnosis Date Noted   Colon cancer screening 04/21/2021   Diverticular disease of colon 04/21/2021   Family history of malignant neoplasm of digestive organs 04/21/2021   Fatigue 04/21/2021   Flatulence, eructation and gas pain 04/21/2021   Gastroesophageal reflux disease 04/21/2021   Lactose intolerance 04/21/2021   Left lower quadrant pain 04/21/2021   Lipoma of intra-abdominal organs 04/21/2021   Other specified conditions associated with female genital organs and menstrual cycle 04/21/2021   Glaucoma 09/17/2020   Pseudophakia of both eyes 03/25/2020   Primary open-angle glaucoma 07/11/2019   Age-related nuclear cataract of both eyes 07/10/2019   AKI (acute kidney injury) ([place]) 12/07/2016   Hyponatremia 12/07/2016   Syncope due to orthostatic hypotension    History of colonic polyps 02/26/2009   Allergic rhinitis 10/06/2007   Backache 08/21/2007   ECZEMA, HANDS 05/08/2007   Anxiety state 01/31/2007   Essential hypertension 01/31/2007    Past Surgical History:  Procedure Laterality Date   TONSILLECTOMY AND ADENOIDECTOMY      OB History   No obstetric history on file.       Home Medications    Prior to Admission medications   Medication Sig Start Date End Date Taking? Authorizing Provider  azelastine (ASTELIN) 0.1 % nasal spray 2 (two) times daily as needed 03/30/19  Yes [provider]  benazepril-hydrochlorthiazide (LOTENSIN HCT) 20-12.5 MG tablet TAKE 1 TABLET BY MOUTH EVERY DAY 06/03/22  Yes Billie Ruddy, MD  diclofenac Sodium (VOLTAREN) 1 % GEL Apply 2 g topically daily as needed. Rub into affected area of foot 2 to 4 times daily 11/02/21  Yes Trula Slade, DPM  famotidine (PEPCID) 20 MG tablet Take 20 mg by mouth 2 (two) times daily. OTC   Yes [provider]  fexofenadine (ALLEGRA ALLERGY) 60 MG tablet Take 1 tablet (60 mg total) by mouth daily. 07/23/22  Yes Billie Ruddy, MD  hydrOXYzine (ATARAX/VISTARIL) 25 MG tablet    Yes [provider]  Multiple Vitamin (MULTIVITAMIN WITH MINERALS) TABS Take 1 tablet by mouth daily.   Yes [provider]  Multiple Vitamins-Minerals (CENTRUM SILVER 50+WOMEN) TABS Take by mouth daily. OTC   Yes [provider]  Probiotic Product (PROBIOTIC DAILY PO) Take by mouth.   Yes [provider]  XIIDRA 5 % SOLN Apply 1 drop to eye 2 (two) times daily. 07/12/22  Yes [provider]  cyclobenzaprine (FLEXERIL) 5 MG tablet Take 1 tablet (5 mg total) by mouth at bedtime as needed for muscle spasms. Patient not  taking: Reported on 07/23/2022 04/20/21   Billie Ruddy, MD  MOMETASONE FUROATE NA Place 2 sprays into the nose daily. 50 mcg    [provider]  RESTASIS 0.05 % ophthalmic emulsion  10/12/21   [provider]    Family History History reviewed. No pertinent family history.  Social History Social History   Tobacco Use   Smoking status: Never   Smokeless tobacco: Never  Substance Use Topics   Alcohol use: No   Drug use: No     Allergies   Azithromycin, Cephalosporins, Clarithromycin, Doxycycline hyclate, and  Penicillins   Review of Systems Review of Systems   Physical Exam Triage Vital Signs ED Triage Vitals  Enc Vitals Group     BP 08/15/22 1530 120/70     Pulse Rate 08/15/22 1530 66     Resp 08/15/22 1530 16     Temp 08/15/22 1530 98.2 F (36.8 C)     Temp Source 08/15/22 1530 Oral     SpO2 08/15/22 1530 97 %     Weight 08/15/22 1530 159 lb (72.1 kg)     Height 08/15/22 1530 '5\' 2"'$  (1.575 m)     Head Circumference --      Peak Flow --      Pain Score 08/15/22 1527 3     Pain Loc --      Pain Edu? --      Excl. in [facility]? --    No data found.  Updated Vital Signs BP 120/70 (BP Location: Right Arm)   Pulse 66   Temp 98.2 F (36.8 C) (Oral)   Resp 16   Ht '5\' 2"'$  (1.575 m)   Wt 159 lb (72.1 kg)   SpO2 97%   BMI 29.08 kg/m   Visual Acuity Right Eye Distance:   Left Eye Distance:   Bilateral Distance:    Right Eye Near:   Left Eye Near:    Bilateral Near:     Physical Exam Constitutional:      Appearance: Normal appearance.  Eyes:     Extraocular Movements: Extraocular movements intact.  Pulmonary:     Effort: Pulmonary effort is normal.  Musculoskeletal:     Comments: Mild tenderness present at the base of the left second metatarsal, no ecchymosis, swelling or deformity, able to bear weight, able to complete range of motion, capillary refill less than 3, sensation intact, no ecchymosis present on exam  Neurological:     Mental Status: She is alert and oriented to person, place, and time. Mental status is at baseline.      UC Treatments / Results  Labs (all labs ordered are listed, but only abnormal results are displayed) Labs Reviewed - No data to display  EKG   Radiology No results found.  Procedures Procedures (including critical care time)  Medications Ordered in UC Medications - No data to display  Initial Impression / Assessment and Plan / UC Course  I have reviewed the triage vital signs and the nursing notes.  Pertinent labs & imaging  results that were available during my care of the patient were reviewed by me and considered in my medical decision making (see chart for details).  Acute left toe pain  X-ray showing Medial subluxation of the proximal phalanx most likely due to ligament injury, showed imaging and discussed findings with patient and family as deformity is not overt on examination will defer resetting at this time, patient is already established with podiatrist recommended notification tomorrow  with follow-up, buddy taped with the third toe and advised supportive measures through RICE, heat and over-the-counter analgesics for management Final Clinical Impressions(s) / UC Diagnoses   Final diagnoses:  None   Discharge Instructions   None    ED Prescriptions   None    PDMP not reviewed this encounter.   Hans Eden, NP 08/15/22 [phone]

## 2022-08-15 NOTE — Discharge Instructions (Addendum)
X-ray today a ligament injury and that the bone is offset from joint space, there is no break in the bone, you will need to follow-up with podiatry for further evaluation  Please notify your doctor tomorrow  Your foot has been buddy taped to keep injury in place  You may use ice or heat over the area as needed in 10 to 15-minute intervals  You may continue use of Advil or Tylenol every 6 hours as needed for pain  You may continue activity as tolerated  If your symptoms continue to persist past 2 weeks please follow-up with your primary doctor for reevaluation

## 2022-08-15 NOTE — ED Triage Notes (Signed)
Chief Complaint: left foot pain. States was out shopping and states tripped on the sidewalk, the left foot was folded back and stubbed the 2nd left toe. Toe is sore and swollen.   Onset: Last night   Prescriptions or OTC medications tried: Yes- Advil     with mild relief

## 2022-08-19 ENCOUNTER — Telehealth: Payer: Self-pay | Admitting: Urology

## 2022-08-19 ENCOUNTER — Ambulatory Visit (INDEPENDENT_AMBULATORY_CARE_PROVIDER_SITE_OTHER): Payer: Medicare PPO

## 2022-08-19 ENCOUNTER — Ambulatory Visit: Payer: Medicare PPO | Admitting: Podiatry

## 2022-08-19 DIAGNOSIS — T1490XA Injury, unspecified, initial encounter: Secondary | ICD-10-CM

## 2022-08-19 DIAGNOSIS — H05332 Deformity of left orbit due to trauma or surgery: Secondary | ICD-10-CM | POA: Diagnosis not present

## 2022-08-19 DIAGNOSIS — S93105A Unspecified dislocation of left toe(s), initial encounter: Secondary | ICD-10-CM

## 2022-08-19 NOTE — Patient Instructions (Signed)
Pre-Operative Instructions  Congratulations, you have decided to take an important step to improving your quality of life.  You can be assured that the doctors of [facility] will be with you every step of the way.  Plan to be at the surgery center/hospital at least 1 (one) hour prior to your scheduled time unless otherwise directed by the surgical center/hospital staff.  You must have a responsible adult accompany you, remain during the surgery and drive you home.  Make sure you have directions to the surgical center/hospital and know how to get there on time. For hospital based surgery you will need to obtain a history and physical form from your family physician within 1 month prior to the date of surgery- we will give you a form for you primary physician.  We make every effort to accommodate the date you request for surgery.  There are however, times where surgery dates or times have to be moved.  We will contact you as soon as possible if a change in schedule is required.   No Aspirin/Ibuprofen for one week before surgery.  If you are on aspirin, any non-steroidal anti-inflammatory medications (Mobic, Aleve, Ibuprofen) you should stop taking it 7 days prior to your surgery.  You make take Tylenol  For pain prior to surgery.  Medications- If you are taking daily heart and blood pressure medications, seizure, reflux, allergy, asthma, anxiety, pain or diabetes medications, make sure the surgery center/hospital is aware before the day of surgery so they may notify you which medications to take or avoid the day of surgery. No food or drink after midnight the night before surgery unless directed otherwise by surgical center/hospital staff. No alcoholic beverages 24 hours prior to surgery.  No smoking 24 hours prior to or 24 hours after surgery. Wear loose pants or shorts- loose enough to fit over bandages, boots, and casts. No slip on shoes, sneakers are best. Bring your boot with you to the  surgery center/hospital.  Also bring crutches or a walker if your physician has prescribed it for you.  If you do not have this equipment, it will be provided for you after surgery. If you have not been contracted by the surgery center/hospital by the day before your surgery, call to confirm the date and time of your surgery. Leave-time from work may vary depending on the type of surgery you have.  Appropriate arrangements should be made prior to surgery with your employer. Prescriptions will be provided immediately following surgery by your doctor.  Have these filled as soon as possible after surgery and take the medication as directed. Remove nail polish on the operative foot. Wash the night before surgery.  The night before surgery wash the foot and leg well with the antibacterial soap provided and water paying special attention to beneath the toenails and in between the toes.  Rinse thoroughly with water and dry well with a towel.  Perform this wash unless told not to do so by your physician.  Enclosed: 1 Ice pack (please put in freezer the night before surgery)   1 Hibiclens skin cleaner   Pre-op Instructions  If you have any questions regarding the instructions, do not hesitate to call our office at any point during this process.   [place]: [address] [place], [place] [address] [phone]  [place]: [address] [place],  [address] [phone]  Dr. Juanluis Guastella, DPM  

## 2022-08-19 NOTE — Telephone Encounter (Signed)
DOS - 08/25/22  REDUCTION 2ND LEFT DISLOCATION --- [id number]  [facility]   PER [facility] [employer]BSITE FOR CPT CODE 25956 NO PRIOR AUTH IS REQUIRED.

## 2022-08-22 NOTE — Progress Notes (Signed)
Subjective: Chief Complaint  Patient presents with   Foot Injury    Left foot, 2nd digit, patient injured foot taking the wrong step and lost balance    DOI: 08/15/2022  72 year old female presents to the office today for left second toe injury.  She states that she took a wrong step, lost her balance bending her toe back.  She was seen in urgent care and ligamentous injury was noted.  She presents today for follow-up evaluation.   Objective: AAO x3, NAD DP/PT pulses palpable bilaterally, CRT less than 3 seconds There is edema noted along the second MPJ of the left foot.  There is tenderness palpation along this area.  There is no pain at particular metatarsals.  Toe sits dorsiflex.  There is a good cap refill time to the toe and sensation intact. No pain with calf compression, swelling, warmth, erythema  Assessment: Left second toe dislocation  Plan: -All treatment options discussed with the patient including all alternatives, risks, complications.  -X-rays obtained reviewed.  Dislocation of the second MPJ. -I discussed with the patient's surgical invention in order to reduce the deformity.  Discussed with her pinning of the second toe with open reduction and possible repair of ligaments of the MTPJ.  She has good cap refill time.  I like to do this next week and let the swelling improve a little bit before surgery if able. -The incision placement as well as the postoperative course was discussed with the patient. I discussed risks of the surgery which include, but not limited to, infection, bleeding, pain, swelling, need for further surgery, delayed or nonhealing, painful or ugly scar, numbness or sensation changes, over/under correction, recurrence, transfer lesions, further deformity, hardware failure, DVT/PE, loss of toe/foot, redislocation. Patient understands these risks and wishes to proceed with surgery. The surgical consent was reviewed with the patient all 3 pages were signed. No  promises or guarantees were given to the outcome of the procedure. All questions were answered to the best of my ability. Before the surgery the patient was encouraged to call the office if there is any further questions. The surgery will be performed at the [facility] on an outpatient basis  Trula Slade DPM

## 2022-08-25 ENCOUNTER — Other Ambulatory Visit: Payer: Self-pay | Admitting: Podiatry

## 2022-08-25 ENCOUNTER — Encounter: Payer: Self-pay | Admitting: Podiatry

## 2022-08-25 DIAGNOSIS — S93105A Unspecified dislocation of left toe(s), initial encounter: Secondary | ICD-10-CM | POA: Diagnosis not present

## 2022-08-25 DIAGNOSIS — S93125A Dislocation of metatarsophalangeal joint of left lesser toe(s), initial encounter: Secondary | ICD-10-CM | POA: Diagnosis not present

## 2022-08-25 DIAGNOSIS — M84478G Pathological fracture, left toe(s), subsequent encounter for fracture with delayed healing: Secondary | ICD-10-CM | POA: Diagnosis not present

## 2022-08-25 MED ORDER — HYDROCODONE-ACETAMINOPHEN 5-325 MG PO TABS: 1.0000 | ORAL_TABLET | Freq: Four times a day (QID) | ORAL | 0 refills | Status: AC | PRN

## 2022-08-30 ENCOUNTER — Ambulatory Visit (INDEPENDENT_AMBULATORY_CARE_PROVIDER_SITE_OTHER): Payer: Medicare PPO | Admitting: Podiatry

## 2022-08-30 ENCOUNTER — Ambulatory Visit (INDEPENDENT_AMBULATORY_CARE_PROVIDER_SITE_OTHER): Payer: Medicare PPO

## 2022-08-30 VITALS — BP 116/67 | HR 69

## 2022-08-30 DIAGNOSIS — S93105A Unspecified dislocation of left toe(s), initial encounter: Secondary | ICD-10-CM | POA: Diagnosis not present

## 2022-08-30 DIAGNOSIS — S93105D Unspecified dislocation of left toe(s), subsequent encounter: Secondary | ICD-10-CM

## 2022-08-30 NOTE — Progress Notes (Signed)
Patient presents today for post op visit # 1 , patient of Dr. Jacqualyn Posey   POV # 1 DOS 08/25/22 --- LEFT FOOT SURGICAL REDUCTION OF 2ND TOE DISLOCATION PINNING OF TOE, POSSIBLE REPAIR OF LIGAMENT    Patient presents in walking boot. Denies any falls or injury to the foot. Foot is slightly swollen. No signs of infection. No calf pain or shortness of breath. Bandages dry and intact.   BP: 116/67 P: 69     Xrays taken today and reviewed by Dr. Jacqualyn Posey. He did take a look at the foot today as well.   Foot redressed today and placed back in the boot. Reviewed icing and elevation. Patient will follow up with Dr. Jacqualyn Posey for POV# 2 in 2 weeks with X-ray.   --  X-rays obtained reviewed.  3 views of the left foot obtained.  There appears to be rectus position with K wire intact.  No complicating factors.  There is decreased edema compared to what it was prior to surgery.  Mild discomfort but also improved.  There is no erythema or signs of infection today.  Continue cam boot with limited weightbearing.   Return in about 2 weeks (around 09/13/2022) for POV # 2 DOS 3/13/2 LT FOOT SURGICAL REDUCTION OF 2ND TOE DISLOCATION PINNING OF TOE (x-ray).  Trula Slade DPM

## 2022-09-09 ENCOUNTER — Encounter: Payer: Medicare PPO | Admitting: Podiatry

## 2022-09-09 DIAGNOSIS — H401133 Primary open-angle glaucoma, bilateral, severe stage: Secondary | ICD-10-CM | POA: Diagnosis not present

## 2022-09-16 ENCOUNTER — Ambulatory Visit (INDEPENDENT_AMBULATORY_CARE_PROVIDER_SITE_OTHER): Payer: Medicare PPO

## 2022-09-16 ENCOUNTER — Encounter: Payer: Medicare PPO | Admitting: Podiatry

## 2022-09-16 ENCOUNTER — Ambulatory Visit (INDEPENDENT_AMBULATORY_CARE_PROVIDER_SITE_OTHER): Payer: Medicare PPO | Admitting: Podiatry

## 2022-09-16 DIAGNOSIS — S93105D Unspecified dislocation of left toe(s), subsequent encounter: Secondary | ICD-10-CM | POA: Diagnosis not present

## 2022-09-19 NOTE — Progress Notes (Addendum)
Subjective: No chief complaint on file.  72 year old female presents the office today for follow evaluation regarding closed reduction with pinning of the left second toe digit dislocation of the toe.  She said that she is doing well.  Occasional discomfort after being on her feet.  No fevers or chills.  No other concerns.  Objective: AAO x3, NAD DP/PT pulses palpable bilaterally, CRT less than 3 seconds K wire intact of the second toe without any drainage or pus.  There is minimal edema present.  There is no erythema warmth or signs of infection.  The toe is rectus.  No other areas of discomfort. No pain with calf compression, swelling, warmth, erythema  Assessment: 72 year old female status post closed reduction with pinning second toe  Plan: -All treatment options discussed with the patient including all alternatives, risks, complications.  -X-ray today reviewed.  Hardware intact with any complicating factors.  Toe is rectus. -Discussion and wash with soap and water, dry thoroughly and apply a similar bandage.  Keep antibiotic ointment on the tip of the wire.  Continue cam boot, ice, elevation, compression. -Patient encouraged to call the office with any questions, concerns, change in symptoms.   Return in about 3 weeks (around 10/07/2022) for x-ray, pin removal .  Vivi Barrack DPM

## 2022-09-21 ENCOUNTER — Encounter: Payer: Medicare PPO | Admitting: Podiatry

## 2022-09-22 ENCOUNTER — Ambulatory Visit: Payer: Medicare PPO

## 2022-09-24 DIAGNOSIS — H018 Other specified inflammations of eyelid: Secondary | ICD-10-CM | POA: Diagnosis not present

## 2022-09-24 DIAGNOSIS — H04123 Dry eye syndrome of bilateral lacrimal glands: Secondary | ICD-10-CM | POA: Diagnosis not present

## 2022-09-24 DIAGNOSIS — H401133 Primary open-angle glaucoma, bilateral, severe stage: Secondary | ICD-10-CM | POA: Diagnosis not present

## 2022-10-06 DIAGNOSIS — K219 Gastro-esophageal reflux disease without esophagitis: Secondary | ICD-10-CM | POA: Diagnosis not present

## 2022-10-11 ENCOUNTER — Ambulatory Visit
Admission: RE | Admit: 2022-10-11 | Discharge: 2022-10-11 | Disposition: A | Discharge status: Home / Self Care | Payer: Medicare PPO | Source: Ambulatory Visit | Attending: Family Medicine | Admitting: Family Medicine

## 2022-10-11 DIAGNOSIS — Z1231 Encounter for screening mammogram for malignant neoplasm of breast: Secondary | ICD-10-CM | POA: Diagnosis not present

## 2022-10-14 ENCOUNTER — Ambulatory Visit (INDEPENDENT_AMBULATORY_CARE_PROVIDER_SITE_OTHER): Payer: Medicare PPO | Admitting: Podiatry

## 2022-10-14 ENCOUNTER — Ambulatory Visit (INDEPENDENT_AMBULATORY_CARE_PROVIDER_SITE_OTHER): Payer: Medicare PPO

## 2022-10-14 DIAGNOSIS — S93105D Unspecified dislocation of left toe(s), subsequent encounter: Secondary | ICD-10-CM

## 2022-10-14 DIAGNOSIS — Z09 Encounter for follow-up examination after completed treatment for conditions other than malignant neoplasm: Secondary | ICD-10-CM

## 2022-10-17 NOTE — Progress Notes (Signed)
Subjective: Chief Complaint  Patient presents with   Routine Post Op    POV # 3 DOS 08/25/22 --- LEFT FOOT SURGICAL REDUCTION OF 2ND TOE DISLOCATION PINNING OF TOE, POSSIBLE REPAIR OF LIGAMENT    72 year old female presents the office today for follow evaluation regarding closed reduction with pinning of the left second toe digit dislocation of the toe.  She presents today for removal of K wire.  States that she has been doing well.  No significant pain.  No fevers or chills.    Objective: AAO x3, NAD DP/PT pulses palpable bilaterally, CRT less than 3 seconds K wire intact of the second toe without any drainage or pus.  There is mild edema present.  There is no erythema warmth or signs of infection.  The toe is rectus.  No other areas of discomfort.  Dry skin present without any skin breakdown. No pain with calf compression, swelling, warmth, erythema  Assessment: 72 year old female status post closed reduction with pinning second toe  Plan: -All treatment options discussed with the patient including all alternatives, risks, complications.  -I cleaned the K wire today was removed in total without any complications.  X-rays were then obtained and reviewed with the patient.  3 views were obtained.  On the AP view the toes and the sensation on the lateral view slightly elevated. -I dispensed a single loop toe regulator to help hold the second toe down and on the left disposition.  She is to continue cam boot for now we discussed gradual transition to regular shoe as tolerated.  Continue ice, elevate as well as compression to help with any residual edema.  Vivi Barrack DPM

## 2022-11-04 ENCOUNTER — Ambulatory Visit: Payer: Medicare PPO | Admitting: Family Medicine

## 2022-11-05 ENCOUNTER — Ambulatory Visit (INDEPENDENT_AMBULATORY_CARE_PROVIDER_SITE_OTHER): Payer: Medicare PPO | Admitting: Podiatry

## 2022-11-05 ENCOUNTER — Ambulatory Visit (INDEPENDENT_AMBULATORY_CARE_PROVIDER_SITE_OTHER): Payer: Medicare PPO

## 2022-11-05 DIAGNOSIS — S93105D Unspecified dislocation of left toe(s), subsequent encounter: Secondary | ICD-10-CM

## 2022-11-09 DIAGNOSIS — J3081 Allergic rhinitis due to animal (cat) (dog) hair and dander: Secondary | ICD-10-CM | POA: Diagnosis not present

## 2022-11-09 DIAGNOSIS — J3089 Other allergic rhinitis: Secondary | ICD-10-CM | POA: Diagnosis not present

## 2022-11-09 DIAGNOSIS — J301 Allergic rhinitis due to pollen: Secondary | ICD-10-CM | POA: Diagnosis not present

## 2022-11-09 DIAGNOSIS — H1045 Other chronic allergic conjunctivitis: Secondary | ICD-10-CM | POA: Diagnosis not present

## 2022-11-10 ENCOUNTER — Ambulatory Visit: Payer: Medicare PPO | Admitting: Family Medicine

## 2022-11-12 NOTE — Progress Notes (Signed)
Subjective: Chief Complaint  Patient presents with   Routine Post Op    POV #4 DOS 08/25/22 --- LEFT FOOT SURGICAL REDUCTION OF 2ND TOE DISLOCATION PINNING OF TOE, POSSIBLE REPAIR OF LIGAMENT     72 year old female presents the office today for follow evaluation regarding closed reduction with pinning of the left second toe digit dislocation of the toe.  She is back to her regular shoe and states that she is doing well.  She is been working in the yard and getting back to more normal activities.  No significant pain.  No fevers or chills.  No other concerns.    Objective: AAO x3, NAD DP/PT pulses palpable bilaterally, CRT less than 3 seconds Clinical second toe is rectus.  There is mild edema present.  There is no erythema warmth or signs of infection.   No other areas of discomfort.  Dry skin present without any skin breakdown. No pain with calf compression, swelling, warmth, erythema  Assessment: 72 year old female status post closed reduction with pinning second toe  Plan: -All treatment options discussed with the patient including all alternatives, risks, complications.  -I dispensed a single loop toe regulator to help hold the toe in a rectus position.  Continue regular shoe as tolerated.  Continue ice, elevate as follows compression of any residual edema.  Return in about 3 months (around 02/05/2023).  Vivi Barrack DPM

## 2022-11-29 ENCOUNTER — Encounter: Payer: Self-pay | Admitting: Family Medicine

## 2022-11-29 ENCOUNTER — Other Ambulatory Visit (HOSPITAL_COMMUNITY)
Admission: RE | Admit: 2022-11-29 | Discharge: 2022-11-29 | Disposition: A | Discharge status: Home / Self Care | Payer: Medicare PPO | Source: Ambulatory Visit | Attending: Family Medicine | Admitting: Family Medicine

## 2022-11-29 ENCOUNTER — Ambulatory Visit: Payer: Medicare PPO | Admitting: Family Medicine

## 2022-11-29 VITALS — BP 108/64 | HR 70 | Temp 98.7°F | Wt 154.6 lb

## 2022-11-29 DIAGNOSIS — N760 Acute vaginitis: Secondary | ICD-10-CM | POA: Insufficient documentation

## 2022-11-29 DIAGNOSIS — R3989 Other symptoms and signs involving the genitourinary system: Secondary | ICD-10-CM | POA: Diagnosis not present

## 2022-11-29 DIAGNOSIS — R300 Dysuria: Secondary | ICD-10-CM

## 2022-11-29 LAB — POC URINALSYSI DIPSTICK (AUTOMATED)
Bilirubin, UA: NEGATIVE
Glucose, UA: NEGATIVE
Ketones, UA: NEGATIVE
Nitrite, UA: NEGATIVE
Protein, UA: NEGATIVE
Spec Grav, UA: 1.025 (ref 1.010–1.025)
Urobilinogen, UA: NEGATIVE E.U./dL — AB
pH, UA: 6 (ref 5.0–8.0)

## 2022-11-29 MED ORDER — NITROFURANTOIN MONOHYD MACRO 100 MG PO CAPS
100.0000 mg | ORAL_CAPSULE | Freq: Two times a day (BID) | ORAL | 0 refills | Status: AC
Start: 2022-11-29 — End: 2022-12-06

## 2022-11-29 NOTE — Patient Instructions (Signed)
A prescription for Macrobid 1 pill twice a day was sent to your pharmacy for UTI.  If you develop reaction such as hives stop the medication and notify clinic.  Urine was sent for a urine culture to see what bacteria if any grow.  We also got the sample to see if there are any signs of yeast infection.  If medication is needed for that based on the results we will send it to your pharmacy.

## 2022-11-29 NOTE — Progress Notes (Signed)
Established Patient Office Visit   Subjective  Patient ID: Kristy Becker, female    DOB: June 19, 1950  Age: 72 y.o. MRN: [id number]  Chief Complaint  Patient presents with   Vaginitis    Allergist told her to use Cerave, not the olay which caused the first infection. The washing powder caused the second infection. Just has an itching, and sting when urinates.     Patient is a 72 year old female seen for acute concern.  Patient endorses itching and stinging sensation with urination that started 4 days ago.  Patient also with discomfort/irritation when sitting.  Patient denies discharge, suprapubic pain/pressure, frequency, nausea, vomiting, low back pain.  Trying to drink more water.  Recently changed moisturizer and soap.    Past Medical History:  Diagnosis Date   ALLERGIC RHINITIS 10/06/2007   ANXIETY 01/31/2007   BACK PAIN 08/21/2007   COLONIC POLYPS, HX OF 02/26/2009   ECZEMA, HANDS 05/08/2007   Glaucoma    HYPERTENSION 01/31/2007   Past Surgical History:  Procedure Laterality Date   TONSILLECTOMY AND ADENOIDECTOMY     Social History   Tobacco Use   Smoking status: Never   Smokeless tobacco: Never  Substance Use Topics   Alcohol use: No   Drug use: No   No family history on file. Allergies  Allergen Reactions   Azithromycin     nausea   Cephalosporins Other (See Comments)    unknown   Clarithromycin Other (See Comments)    unknown   Doxycycline Hyclate Other (See Comments)   Omeprazole-Sodium Bicarbonate Other (See Comments)   Penicillins Hives      ROS Negative unless stated above    Objective:     BP 108/64 (BP Location: Left Arm, Patient Position: Sitting, Cuff Size: Normal)   Pulse 70   Temp 98.7 F (37.1 C) (Oral)   Wt 154 lb 9.6 oz (70.1 kg)   SpO2 97%   BMI 28.28 kg/m    Physical Exam Constitutional:      General: She is not in acute distress.    Appearance: Normal appearance.  HENT:     Head: Normocephalic and atraumatic.     Nose:  Nose normal.     Mouth/Throat:     Mouth: Mucous membranes are moist.  Cardiovascular:     Rate and Rhythm: Normal rate.     Heart sounds: No murmur heard.    No gallop.  Pulmonary:     Effort: Pulmonary effort is normal. No respiratory distress.     Breath sounds: No wheezing, rhonchi or rales.  Abdominal:     Palpations: Abdomen is soft.     Tenderness: There is no abdominal tenderness.  Skin:    General: Skin is warm and dry.  Neurological:     Mental Status: She is alert and oriented to person, place, and time. Mental status is at baseline.      Results for orders placed or performed in visit on 11/29/22  POCT Urinalysis Dipstick (Automated)  Result Value Ref Range   Color, UA yellow    Clarity, UA cloudy    Glucose, UA Negative Negative   Bilirubin, UA neg    Ketones, UA neg    Spec Grav, UA 1.025 1.010 - 1.025   Blood, UA 1+    pH, UA 6.0 5.0 - 8.0   Protein, UA Negative Negative   Urobilinogen, UA negative (A) 0.2 or 1.0 E.U./dL   Nitrite, UA neg    Leukocytes, UA Large (3+) (  A) Negative      Assessment & Plan:  Acute vaginitis -     Cervicovaginal ancillary only  Suspected UTI -     Urine Culture -     Nitrofurantoin Monohyd Macro; Take 1 capsule (100 mg total) by mouth 2 (two) times daily for 7 days.  Dispense: 14 capsule; Refill: 0  Dysuria -     POCT Urinalysis Dipstick (Automated) -     Urine Culture  Concern for UTI as UA with 1+ blood, 3+ leuks.  Obtain UCX.  Patient with numerous antibiotic allergies.  Will start Macrobid.  Patient given strict precautions.  Aptima swab obtained to evaluate for yeast or other vaginal infection.  If needed will send in additional medication based on results.  Given strict precautions.  Return if symptoms worsen or fail to improve.   Deeann Saint, MD

## 2022-12-01 LAB — URINE CULTURE
MICRO NUMBER:: 15090851
SPECIMEN QUALITY:: ADEQUATE

## 2022-12-01 LAB — CERVICOVAGINAL ANCILLARY ONLY
Bacterial Vaginitis (gardnerella): NEGATIVE
Candida Glabrata: NEGATIVE
Candida Vaginitis: POSITIVE — AB
Comment: NEGATIVE
Comment: NEGATIVE
Comment: NEGATIVE
Comment: NEGATIVE
Trichomonas: NEGATIVE

## 2022-12-02 ENCOUNTER — Other Ambulatory Visit: Payer: Self-pay | Admitting: Family Medicine

## 2022-12-02 DIAGNOSIS — H04123 Dry eye syndrome of bilateral lacrimal glands: Secondary | ICD-10-CM | POA: Diagnosis not present

## 2022-12-02 DIAGNOSIS — B3731 Acute candidiasis of vulva and vagina: Secondary | ICD-10-CM

## 2022-12-02 DIAGNOSIS — H401133 Primary open-angle glaucoma, bilateral, severe stage: Secondary | ICD-10-CM | POA: Diagnosis not present

## 2022-12-02 MED ORDER — FLUCONAZOLE 150 MG PO TABS
150.0000 mg | ORAL_TABLET | Freq: Once | ORAL | 0 refills | Status: AC
Start: 2022-12-02 — End: 2022-12-02

## 2022-12-03 ENCOUNTER — Telehealth: Payer: Self-pay | Admitting: Family Medicine

## 2022-12-03 NOTE — Telephone Encounter (Signed)
ATC pt back, no answer. Left VM to call office back.

## 2022-12-03 NOTE — Telephone Encounter (Signed)
Pt called to say her stomach is not agreeing with the:  nitrofurantoin, macrocrystal-monohydrate, (MACROBID) 100 MG capsule   Pt would like a call back to discuss.

## 2022-12-15 ENCOUNTER — Other Ambulatory Visit: Payer: Self-pay | Admitting: Family Medicine

## 2022-12-21 DIAGNOSIS — J310 Chronic rhinitis: Secondary | ICD-10-CM | POA: Diagnosis not present

## 2022-12-21 DIAGNOSIS — J343 Hypertrophy of nasal turbinates: Secondary | ICD-10-CM | POA: Diagnosis not present

## 2023-01-10 DIAGNOSIS — J301 Allergic rhinitis due to pollen: Secondary | ICD-10-CM | POA: Diagnosis not present

## 2023-01-10 DIAGNOSIS — H1045 Other chronic allergic conjunctivitis: Secondary | ICD-10-CM | POA: Diagnosis not present

## 2023-01-10 DIAGNOSIS — J3081 Allergic rhinitis due to animal (cat) (dog) hair and dander: Secondary | ICD-10-CM | POA: Diagnosis not present

## 2023-01-10 DIAGNOSIS — J3089 Other allergic rhinitis: Secondary | ICD-10-CM | POA: Diagnosis not present

## 2023-01-18 DIAGNOSIS — J343 Hypertrophy of nasal turbinates: Secondary | ICD-10-CM | POA: Diagnosis not present

## 2023-01-18 DIAGNOSIS — J3489 Other specified disorders of nose and nasal sinuses: Secondary | ICD-10-CM | POA: Diagnosis not present

## 2023-02-07 ENCOUNTER — Ambulatory Visit (INDEPENDENT_AMBULATORY_CARE_PROVIDER_SITE_OTHER): Payer: Medicare PPO

## 2023-02-07 ENCOUNTER — Ambulatory Visit: Payer: Medicare PPO | Admitting: Podiatry

## 2023-02-07 DIAGNOSIS — S93105D Unspecified dislocation of left toe(s), subsequent encounter: Secondary | ICD-10-CM

## 2023-02-07 DIAGNOSIS — M21619 Bunion of unspecified foot: Secondary | ICD-10-CM | POA: Diagnosis not present

## 2023-02-07 NOTE — Progress Notes (Unsigned)
Subjective: Chief Complaint  Patient presents with   Debridement    Trim toenails/calluses-diabetic - last A1c was 34.29   72 year old female presents the office for above concerns.  He says his nails and calluses are causing pain.  Denies any open lesions.  Objective: AAO x3, NAD DP/PT pulses palpable bilaterally, CRT less than 3 seconds Sensation decreased Previous left fourth and fifth amputations.  Nails are hypertrophic, dystrophic with yellow, brown discoloration possible debrided.  Nails affected and causing discomfort nails 1 through 3 on the left and 1 through 5 on the right.  Hyperkeratotic lesion submetatarsal 2 on the right foot without any underlying ulceration drainage or signs of infection as well as on the previous amputation of the left foot laterally.  There is no ulcerations. No pain with calf compression, swelling, warmth, erythema  Assessment: Symptomatic onychosis, preulcerative calluses  Plan: -All treatment options discussed with the patient including all alternatives, risks, complications.  -Sharply debrided nails x 10 without any complications or bleeding. -Separate debrided hyperkeratotic lesions x 2 without any complications or bleeding.  Continue moisturizer, offloading. -Daily foot inspection, glucose control. -Patient encouraged to call the office with any questions, concerns, change in symptoms.   Vivi Barrack DPM

## 2023-02-09 ENCOUNTER — Ambulatory Visit: Payer: Medicare PPO | Admitting: Family Medicine

## 2023-02-24 ENCOUNTER — Encounter: Payer: Self-pay | Admitting: Family Medicine

## 2023-02-24 ENCOUNTER — Ambulatory Visit: Payer: Medicare PPO | Admitting: Family Medicine

## 2023-02-24 VITALS — BP 140/70 | HR 95 | Temp 98.0°F | Ht 62.0 in | Wt 157.6 lb

## 2023-02-24 DIAGNOSIS — M791 Myalgia, unspecified site: Secondary | ICD-10-CM | POA: Diagnosis not present

## 2023-02-24 DIAGNOSIS — I10 Essential (primary) hypertension: Secondary | ICD-10-CM | POA: Diagnosis not present

## 2023-02-24 DIAGNOSIS — M5432 Sciatica, left side: Secondary | ICD-10-CM

## 2023-02-24 DIAGNOSIS — M5431 Sciatica, right side: Secondary | ICD-10-CM

## 2023-02-24 DIAGNOSIS — R252 Cramp and spasm: Secondary | ICD-10-CM | POA: Diagnosis not present

## 2023-02-24 DIAGNOSIS — R42 Dizziness and giddiness: Secondary | ICD-10-CM

## 2023-02-24 DIAGNOSIS — J3089 Other allergic rhinitis: Secondary | ICD-10-CM | POA: Diagnosis not present

## 2023-02-24 LAB — IBC + FERRITIN
Ferritin: 179.9 ng/mL (ref 10.0–291.0)
Iron: 84 ug/dL (ref 42–145)
Saturation Ratios: 26.1 % (ref 20.0–50.0)
TIBC: 322 ug/dL (ref 250.0–450.0)
Transferrin: 230 mg/dL (ref 212.0–360.0)

## 2023-02-24 LAB — CBC WITH DIFFERENTIAL/PLATELET
Basophils Absolute: 0 10*3/uL (ref 0.0–0.1)
Basophils Relative: 0.5 % (ref 0.0–3.0)
Eosinophils Absolute: 0 10*3/uL (ref 0.0–0.7)
Eosinophils Relative: 0.4 % (ref 0.0–5.0)
HCT: 40.4 % (ref 36.0–46.0)
Hemoglobin: 13.3 g/dL (ref 12.0–15.0)
Lymphocytes Relative: 23.5 % (ref 12.0–46.0)
Lymphs Abs: 1.4 10*3/uL (ref 0.7–4.0)
MCHC: 32.9 g/dL (ref 30.0–36.0)
MCV: 90.6 fl (ref 78.0–100.0)
Monocytes Absolute: 0.5 10*3/uL (ref 0.1–1.0)
Monocytes Relative: 8.8 % (ref 3.0–12.0)
Neutro Abs: 4.1 10*3/uL (ref 1.4–7.7)
Neutrophils Relative %: 66.8 % (ref 43.0–77.0)
Platelets: 271 10*3/uL (ref 150.0–400.0)
RBC: 4.45 Mil/uL (ref 3.87–5.11)
RDW: 12.9 % (ref 11.5–15.5)
WBC: 6.1 10*3/uL (ref 4.0–10.5)

## 2023-02-24 LAB — COMPREHENSIVE METABOLIC PANEL
ALT: 19 U/L (ref 0–35)
AST: 18 U/L (ref 0–37)
Albumin: 4 g/dL (ref 3.5–5.2)
Alkaline Phosphatase: 63 U/L (ref 39–117)
BUN: 14 mg/dL (ref 6–23)
CO2: 30 meq/L (ref 19–32)
Calcium: 9.9 mg/dL (ref 8.4–10.5)
Chloride: 103 meq/L (ref 96–112)
Creatinine, Ser: 0.78 mg/dL (ref 0.40–1.20)
GFR: 75.36 mL/min (ref >60.00)
Glucose, Bld: 92 mg/dL (ref 70–99)
Potassium: 3.9 meq/L (ref 3.5–5.1)
Sodium: 141 meq/L (ref 135–145)
Total Bilirubin: 0.3 mg/dL (ref 0.2–1.2)
Total Protein: 7.8 g/dL (ref 6.0–8.3)

## 2023-02-24 LAB — VITAMIN B12: Vitamin B-12: 830 pg/mL (ref 211–911)

## 2023-02-24 LAB — MAGNESIUM: Magnesium: 1.8 mg/dL (ref 1.5–2.5)

## 2023-02-24 MED ORDER — MONTELUKAST SODIUM 10 MG PO TABS
10.0000 mg | ORAL_TABLET | Freq: Every day | ORAL | 3 refills | Status: AC
Start: 2023-02-24 — End: ?

## 2023-02-24 NOTE — Addendum Note (Signed)
Addended by: Marian Sorrow D on: 02/24/2023 01:42 PM   Modules accepted: Orders

## 2023-02-24 NOTE — Progress Notes (Signed)
Established Patient Office Visit   Subjective  Patient ID: Kristy Becker, female    DOB: September 08, 1950  Age: 72 y.o. MRN: [id number]  Chief Complaint  Patient presents with   leg cramps    Leg cramps started a month ago, back of the legs, patient is having problems walking up stairs,  Allergies and dizzy     Patient is a 72 year old female seen for ongoing concerns.  Patient endorses having a rough summer.  Injured her toe and had to have surgery on left foot.  Patient then had a procedure to remove a bone in nose open up sinuses.  Now has no congestion but because nose is so open allergy symptoms are worse/causing dizziness.  Taking Zyrtec and azelastine nasal spray.  Seen by allergist, but no new med changes.  Patient also notes muscle soreness in bilateral posterior legs.  Denies edema.  Had difficulty going up a flight of stairs due to the soreness.  Patient is not on a statin.  Is taking benazepril-hydrochlorothiazide 20-12.5 mg daily.  BP well-controlled at home.  Patient has history of whitecoat hypertension causing extreme elevation in BP.  Patient drinking 5 bottles of water per day.  Notes being more sedentary since having the surgeries over the summer.  Was walking daily which seemed to help.    Past Medical History:  Diagnosis Date   ALLERGIC RHINITIS 10/06/2007   ANXIETY 01/31/2007   BACK PAIN 08/21/2007   COLONIC POLYPS, HX OF 02/26/2009   ECZEMA, HANDS 05/08/2007   Glaucoma    HYPERTENSION 01/31/2007   Past Surgical History:  Procedure Laterality Date   TONSILLECTOMY AND ADENOIDECTOMY     Social History   Tobacco Use   Smoking status: Never   Smokeless tobacco: Never  Substance Use Topics   Alcohol use: No   Drug use: No   No family history on file. Allergies  Allergen Reactions   Azithromycin     nausea   Cephalosporins Other (See Comments)    unknown   Clarithromycin Other (See Comments)    unknown   Doxycycline Hyclate Other (See Comments)    Omeprazole-Sodium Bicarbonate Other (See Comments)   Penicillins Hives   Sulfamethoxazole-Trimethoprim Other (See Comments)      ROS Negative unless stated above    Objective:     BP (!) 140/70 (BP Location: Left Arm, Patient Position: Sitting, Cuff Size: Large)   Pulse 95   Temp 98 F (36.7 C) (Oral)   Ht 5\' 2"  (1.575 m)   Wt 157 lb 9.6 oz (71.5 kg)   SpO2 98%   BMI 28.83 kg/m  BP Readings from Last 3 Encounters:  02/24/23 (!) 140/70  11/29/22 108/64  08/30/22 116/67   Wt Readings from Last 3 Encounters:  02/24/23 157 lb 9.6 oz (71.5 kg)  11/29/22 154 lb 9.6 oz (70.1 kg)  08/15/22 159 lb (72.1 kg)      Physical Exam Constitutional:      General: She is not in acute distress.    Appearance: Normal appearance.  HENT:     Head: Normocephalic and atraumatic.     Ears:     Comments: Bilateral TMs full.  No erythema, suppurative fluid.    Nose: Nose normal.     Right Turbinates: Enlarged.     Left Turbinates: Enlarged.     Comments: Boggy nasal turbinates.    Mouth/Throat:     Mouth: Mucous membranes are moist.  Cardiovascular:     Rate and Rhythm:  Normal rate and regular rhythm.     Heart sounds: Normal heart sounds. No murmur heard.    No gallop.  Pulmonary:     Effort: Pulmonary effort is normal. No respiratory distress.     Breath sounds: Normal breath sounds. No wheezing, rhonchi or rales.  Skin:    General: Skin is warm and dry.  Neurological:     Mental Status: She is alert and oriented to person, place, and time.      No results found for any visits on 02/24/23.    Assessment & Plan:  Essential hypertension -Patient with history of whitecoat hypertension.  BP initially extremely elevated at 180/90.  Patient asymptomatic. -Recheck 140/70 -Continue current medication including benazepril-hydrochlorothiazide 20-12.5 mg daily. -Continue lifestyle modifications -Continue monitoring BP at home.  Notify clinic for elevations consistently greater  than 140/90 -     Comprehensive metabolic panel  Muscle cramps -     Comprehensive metabolic panel -     Vitamin B12 -     Magnesium -     CBC with Differential/Platelet -     Iron, TIBC and Ferritin Panel  Environmental and seasonal allergies -Increased allergy symptoms s/p nasal procedure with ENT. -Discussed starting Singulair. -Patient can also continue Zyrtec and azelastine nasal spray if needed. -Can also use saline nasal rinse -Continue follow-up with ENT and allergist. -     CBC with Differential/Platelet -     Montelukast Sodium; Take 1 tablet (10 mg total) by mouth at bedtime.  Dispense: 30 tablet; Refill: 3  Dizziness -Likely 2/2 increased allergy symptoms. - -     CBC with Differential/Platelet  Myalgia -Discussed possible causes including vitamin or electrolyte deficiency.  Medications reviewed.  Not currently on a statin or other medications likely to contribute to symptoms. -Will obtain labs -Discussed stretching and topical analgesics as needed -     Comprehensive metabolic panel -     Vitamin B12 -     Magnesium -     CBC with Differential/Platelet -     Iron, TIBC and Ferritin Panel  Bilateral sciatica -Stretching and other supportive care during visit   Return in about 5 weeks (around 03/31/2023), or if symptoms worsen or fail to improve.   Deeann Saint, MD

## 2023-03-01 NOTE — Progress Notes (Unsigned)
Patient to follow-up on labs,she had questions about medication,  patient would like to know what is a good Allergist to see in [place].

## 2023-04-06 ENCOUNTER — Ambulatory Visit: Payer: Medicare PPO | Admitting: Family Medicine

## 2023-04-06 ENCOUNTER — Encounter: Payer: Self-pay | Admitting: Family Medicine

## 2023-04-06 VITALS — BP 138/82 | HR 81 | Temp 98.0°F | Ht 62.0 in | Wt 157.8 lb

## 2023-04-06 DIAGNOSIS — R109 Unspecified abdominal pain: Secondary | ICD-10-CM | POA: Diagnosis not present

## 2023-04-06 DIAGNOSIS — Z23 Encounter for immunization: Secondary | ICD-10-CM | POA: Diagnosis not present

## 2023-04-06 DIAGNOSIS — K760 Fatty (change of) liver, not elsewhere classified: Secondary | ICD-10-CM | POA: Insufficient documentation

## 2023-04-06 DIAGNOSIS — Z8719 Personal history of other diseases of the digestive system: Secondary | ICD-10-CM | POA: Diagnosis not present

## 2023-04-06 DIAGNOSIS — I10 Essential (primary) hypertension: Secondary | ICD-10-CM | POA: Diagnosis not present

## 2023-04-06 DIAGNOSIS — J3089 Other allergic rhinitis: Secondary | ICD-10-CM | POA: Diagnosis not present

## 2023-04-06 NOTE — Progress Notes (Signed)
Established Patient Office Visit   Subjective  Patient ID: Kristy Becker, female    DOB: 1950-12-30  Age: 72 y.o. MRN: [id number]  Chief Complaint  Patient presents with   Medical Management of Chronic Issues    Allergy medication     Patient is a 72 year old female seen for follow-up on allergies.  Patient had nasal surgery several months ago which has helped with rhinorrhea, congestion.  Patient feels like allergy symptoms have increased with change in weather.  At last [facility] in September patient started on montelukast which seems to have been helping until the last few weeks.  Patient currently taking Zyrtec, montelukast, and azelastine nasal spray.  Patient previously seen by Dr. Madie Reno, allergist, but states no changes were made during last visit.  Patient endorses feeling rundown due to increased allergies.  Patient mentions left-sided abdominal pain x 1 day.  Endorses history of diverticulosis, but has never had diverticulitis.  Patient states she has been eating more salads and thought symptoms may be due to gas.  Symptoms improved some with Gas-X.  Patient notes slight soreness remains.  Denies constipation or diarrhea.  Last BM this morning.  Patient interested in influenza vaccine.  Patient mentions working out with a trainer twice a week which is greatly improved leg pain.  Patient now able to get in and out of bathtub easier.    Patient Active Problem List   Diagnosis Date Noted   Colon cancer screening 04/21/2021   Diverticular disease of colon 04/21/2021   Family history of malignant neoplasm of digestive organs 04/21/2021   Fatigue 04/21/2021   Flatulence, eructation and gas pain 04/21/2021   Gastroesophageal reflux disease 04/21/2021   Lactose intolerance 04/21/2021   Left lower quadrant pain 04/21/2021   Lipoma of intra-abdominal organs 04/21/2021   Other specified conditions associated with female genital organs and menstrual cycle 04/21/2021   Glaucoma  09/17/2020   Pseudophakia of both eyes 03/25/2020   Primary open-angle glaucoma 07/11/2019   Age-related nuclear cataract of both eyes 07/10/2019   AKI (acute kidney injury) (HCC) 12/07/2016   Hyponatremia 12/07/2016   Syncope due to orthostatic hypotension    History of colonic polyps 02/26/2009   Allergic rhinitis 10/06/2007   Backache 08/21/2007   ECZEMA, HANDS 05/08/2007   Anxiety state 01/31/2007   Essential hypertension 01/31/2007   Past Medical History:  Diagnosis Date   ALLERGIC RHINITIS 10/06/2007   ANXIETY 01/31/2007   BACK PAIN 08/21/2007   COLONIC POLYPS, HX OF 02/26/2009   ECZEMA, HANDS 05/08/2007   Glaucoma    HYPERTENSION 01/31/2007   Past Surgical History:  Procedure Laterality Date   TONSILLECTOMY AND ADENOIDECTOMY     Social History   Tobacco Use   Smoking status: Never   Smokeless tobacco: Never  Substance Use Topics   Alcohol use: No   Drug use: No   History reviewed. No pertinent family history. Allergies  Allergen Reactions   Azithromycin     nausea   Cephalosporins Other (See Comments)    unknown   Clarithromycin Other (See Comments)    unknown   Doxycycline Hyclate Other (See Comments)   Omeprazole-Sodium Bicarbonate Other (See Comments)   Penicillins Hives   Sulfamethoxazole-Trimethoprim Other (See Comments)      ROS Negative unless stated above    Objective:     BP (!) 144/76 (BP Location: Left Arm, Patient Position: Sitting, Cuff Size: Normal)   Pulse 81   Temp 98 F (36.7 C) (Oral)   Ht  5\' 2"  (1.575 m)   Wt 157 lb 12.8 oz (71.6 kg)   SpO2 97%   BMI 28.86 kg/m  BP Readings from Last 3 Encounters:  04/06/23 (!) 144/76  02/24/23 (!) 140/70  11/29/22 108/64   Wt Readings from Last 3 Encounters:  04/06/23 157 lb 12.8 oz (71.6 kg)  02/24/23 157 lb 9.6 oz (71.5 kg)  11/29/22 154 lb 9.6 oz (70.1 kg)      Physical Exam Constitutional:      General: She is not in acute distress.    Appearance: Normal appearance.  HENT:      Head: Normocephalic and atraumatic.     Nose: Nose normal.     Mouth/Throat:     Mouth: Mucous membranes are moist.  Eyes:     Conjunctiva/sclera: Conjunctivae normal.  Cardiovascular:     Rate and Rhythm: Normal rate and regular rhythm.  Pulmonary:     Effort: Pulmonary effort is normal.  Abdominal:     General: Bowel sounds are normal.  Skin:    General: Skin is warm and dry.  Neurological:     Mental Status: She is alert and oriented to person, place, and time. Mental status is at baseline.     No results found for any visits on 04/06/23.    Assessment & Plan:  Environmental and seasonal allergies -Continue montelukast and azelastine -Will have patient hold Zyrtec and start Xyzal -For continued or worsening symptoms discussed referral to new allergist. -     Ambulatory referral to Allergy  Left sided abdominal pain -Discussed possible causes of symptoms including gas, constipation, diverticulitis. -Discussed bowel regimen if not having regular BMs. -Food diary -For changes in BMs and symptoms consider diverticulitis.  If needed obtain labs and start ABX. -Given strict precautions -Continue follow-up with GI  Need for influenza vaccination -High-dose influenza vaccine given  Essential hypertension -BP initially elevated but much improved from last visit. -Recheck -Patient has a component of whitecoat syndrome/anxiety -Continue current medications  History of diverticulosis  Return in about 3 months (around 07/07/2023), or if symptoms worsen or fail to improve.   Deeann Saint, MD

## 2023-04-06 NOTE — Patient Instructions (Signed)
A referral to a new allergist was placed.  You should expect a phone call about setting up an appointment.   Continue taking montelukast and azelastine nasal spray.  Start Xyzal which can be found over-the-counter at your local drugstore.  Do not get rid of the Zyrtec that you have as you can alternate it with the Xyzal or go back to it if needed.

## 2023-04-08 ENCOUNTER — Telehealth: Payer: Self-pay | Admitting: Family Medicine

## 2023-04-08 NOTE — Telephone Encounter (Signed)
Requesting a call to make sure the Claritin (plain claritin, not claritin D) she plans to take will not raise the pressure in her eyes

## 2023-04-11 NOTE — Telephone Encounter (Signed)
Called patient she is aware 

## 2023-04-11 NOTE — Telephone Encounter (Signed)
The plain Claritin is ok to take.

## 2023-04-14 DIAGNOSIS — H401133 Primary open-angle glaucoma, bilateral, severe stage: Secondary | ICD-10-CM | POA: Diagnosis not present

## 2023-05-13 DIAGNOSIS — N390 Urinary tract infection, site not specified: Secondary | ICD-10-CM | POA: Diagnosis not present

## 2023-05-13 DIAGNOSIS — N289 Disorder of kidney and ureter, unspecified: Secondary | ICD-10-CM | POA: Diagnosis not present

## 2023-05-13 DIAGNOSIS — I10 Essential (primary) hypertension: Secondary | ICD-10-CM | POA: Diagnosis not present

## 2023-05-13 DIAGNOSIS — R55 Syncope and collapse: Secondary | ICD-10-CM | POA: Diagnosis not present

## 2023-05-16 ENCOUNTER — Ambulatory Visit: Payer: Medicare PPO | Admitting: Family Medicine

## 2023-05-16 ENCOUNTER — Encounter: Payer: Self-pay | Admitting: Family Medicine

## 2023-05-16 VITALS — BP 122/74 | HR 73 | Temp 97.5°F | Wt 159.6 lb

## 2023-05-16 DIAGNOSIS — N390 Urinary tract infection, site not specified: Secondary | ICD-10-CM

## 2023-05-16 DIAGNOSIS — L509 Urticaria, unspecified: Secondary | ICD-10-CM

## 2023-05-16 DIAGNOSIS — R0683 Snoring: Secondary | ICD-10-CM

## 2023-05-16 DIAGNOSIS — I10 Essential (primary) hypertension: Secondary | ICD-10-CM

## 2023-05-16 LAB — POC URINALSYSI DIPSTICK (AUTOMATED)
Bilirubin, UA: NEGATIVE
Blood, UA: NEGATIVE
Glucose, UA: NEGATIVE
Ketones, UA: NEGATIVE
Leukocytes, UA: NEGATIVE
Nitrite, UA: NEGATIVE
Protein, UA: NEGATIVE
Spec Grav, UA: 1.02
Urobilinogen, UA: 0.2 U/dL
pH, UA: 6

## 2023-05-16 MED ORDER — NITROFURANTOIN MONOHYD MACRO 100 MG PO CAPS
100.0000 mg | ORAL_CAPSULE | Freq: Two times a day (BID) | ORAL | 0 refills | Status: DC
Start: 2023-05-16 — End: 2023-12-08

## 2023-05-16 NOTE — Patient Instructions (Signed)
A prescription for Macrobid was sent to your pharmacy.  You can take this to help treat your urinary tract infection.  Continue to monitor for any signs or symptoms of allergic reaction.  Try drinking at least 4-5 16.9 oz bottles of water per day.

## 2023-05-16 NOTE — Addendum Note (Signed)
Addended by: Abbe Amsterdam R on: 05/16/2023 06:00 PM   Modules accepted: Orders

## 2023-05-16 NOTE — Progress Notes (Addendum)
Established Patient Office Visit   Subjective  Patient ID: Kristy Becker, female    DOB: May 13, 1951  Age: 72 y.o. MRN: [id number]  Chief Complaint  Patient presents with   ER Follow-up     4 days ago, patient passed out, vomiting, UTI, was given Cipro in the ER and has hives, patient has stopped taking,   Pt accompanied by her daughter.  Patient is a 72 year old female seen for ED follow-up.  Patient seen in ED while out of town due to syncopal episode, emesis found to have UTI.  Given Cipro.  Patient took medication and then developed hives.  Stopped med 4 days ago.  May have taken 2 days worth.  Last UTI was over the summer.  Pt thinks she was drinking less water prior to start of symptoms.  On diuretic for bp.  Also exercising with a trainer regularly.  Patient's daughter mentions that patient snores.  Concern for sleep apnea.  Patient endorses some difficulty falling asleep.  Feels like needs a nap on days she did not sleep well.    Patient Active Problem List   Diagnosis Date Noted   Hepatic steatosis 04/06/2023   Colon cancer screening 04/21/2021   Diverticular disease of colon 04/21/2021   Family history of malignant neoplasm of digestive organs 04/21/2021   Fatigue 04/21/2021   Flatulence, eructation and gas pain 04/21/2021   Gastroesophageal reflux disease 04/21/2021   Lactose intolerance 04/21/2021   Left lower quadrant pain 04/21/2021   Lipoma of intra-abdominal organs 04/21/2021   Other specified conditions associated with female genital organs and menstrual cycle 04/21/2021   Glaucoma 09/17/2020   Pseudophakia of both eyes 03/25/2020   Primary open-angle glaucoma 07/11/2019   Age-related nuclear cataract of both eyes 07/10/2019   AKI (acute kidney injury) (HCC) 12/07/2016   Hyponatremia 12/07/2016   Syncope due to orthostatic hypotension    History of colonic polyps 02/26/2009   Allergic rhinitis 10/06/2007   Backache 08/21/2007   ECZEMA, HANDS 05/08/2007    Anxiety state 01/31/2007   Essential hypertension 01/31/2007   Past Medical History:  Diagnosis Date   ALLERGIC RHINITIS 10/06/2007   ANXIETY 01/31/2007   BACK PAIN 08/21/2007   COLONIC POLYPS, HX OF 02/26/2009   ECZEMA, HANDS 05/08/2007   Glaucoma    HYPERTENSION 01/31/2007   Past Surgical History:  Procedure Laterality Date   TONSILLECTOMY AND ADENOIDECTOMY     Social History   Tobacco Use   Smoking status: Never   Smokeless tobacco: Never  Substance Use Topics   Alcohol use: No   Drug use: No   History reviewed. No pertinent family history. Allergies  Allergen Reactions   Azithromycin     nausea   Cephalosporins Other (See Comments)    unknown   Ciprofloxacin Hives   Clarithromycin Other (See Comments)    unknown   Doxycycline Hyclate Other (See Comments)   Omeprazole-Sodium Bicarbonate Other (See Comments)   Penicillins Hives   Sulfamethoxazole-Trimethoprim Other (See Comments)      ROS Negative unless stated above    Objective:     BP 122/74 (Patient Position: Sitting, Cuff Size: Normal)   Pulse 73   Temp (!) 97.5 F (36.4 C)   Wt 159 lb 9.6 oz (72.4 kg)   SpO2 98%   BMI 29.19 kg/m  BP Readings from Last 3 Encounters:  04/06/23 138/82  02/24/23 (!) 140/70  11/29/22 108/64   Wt Readings from Last 3 Encounters:  04/06/23 157 lb 12.8 oz (  71.6 kg)  02/24/23 157 lb 9.6 oz (71.5 kg)  11/29/22 154 lb 9.6 oz (70.1 kg)      Physical Exam Constitutional:      General: She is not in acute distress.    Appearance: Normal appearance.  HENT:     Head: Normocephalic and atraumatic.     Nose: Nose normal.     Mouth/Throat:     Mouth: Mucous membranes are moist.  Cardiovascular:     Rate and Rhythm: Normal rate and regular rhythm.     Heart sounds: Normal heart sounds. No murmur heard.    No gallop.  Pulmonary:     Effort: Pulmonary effort is normal. No respiratory distress.     Breath sounds: Normal breath sounds. No wheezing, rhonchi or rales.   Abdominal:     General: Bowel sounds are normal. There is no distension.     Palpations: Abdomen is soft.     Tenderness: There is no abdominal tenderness. There is no right CVA tenderness, left CVA tenderness, guarding or rebound.  Skin:    General: Skin is warm and dry.  Neurological:     Mental Status: She is alert and oriented to person, place, and time.    Results for orders placed or performed in visit on 05/16/23  POCT Urinalysis Dipstick (Automated)  Result Value Ref Range   Color, UA yellow    Clarity, UA clear    Glucose, UA Negative Negative   Bilirubin, UA neg    Ketones, UA neg    Spec Grav, UA 1.020 1.010 - 1.025   Blood, UA neg    pH, UA 6.0 5.0 - 8.0   Protein, UA Negative Negative   Urobilinogen, UA 0.2 0.2 or 1.0 E.U./dL   Nitrite, UA neg    Leukocytes, UA Negative Negative      Assessment & Plan:  Urinary tract infection without hematuria, site unspecified -Partially treated UTI.  Given Cipro but developed hives and stopped medication -Urine culture was not obtained at outside hospital.  Will obtain culture this visit discussed possible limitations. -Start Macrobid -Increase p.o. intake of water and fluids. -     Urine Culture -     Nitrofurantoin Monohyd Macro; Take 1 capsule (100 mg total) by mouth 2 (two) times daily.  Dispense: 14 capsule; Refill: 0  Hives -2/2 Cipro. -Patient stopped medication.  Add to allergy list.  Essential hypertension -Controlled -Continue current medication including benazepril-hydrochlorothiazide 20-12.5 mg daily -Discussed the importance of hydration  Snoring -Home sleep study.  Referral placed.  Return if symptoms worsen or fail to improve.   Deeann Saint, MD

## 2023-05-17 LAB — URINE CULTURE
MICRO NUMBER:: 15796611
SPECIMEN QUALITY:: ADEQUATE

## 2023-05-18 ENCOUNTER — Inpatient Hospital Stay: Payer: Medicare PPO | Admitting: Family Medicine

## 2023-05-25 ENCOUNTER — Telehealth: Payer: Self-pay | Admitting: Family Medicine

## 2023-05-25 NOTE — Telephone Encounter (Signed)
Pt called, returning CMA's call. CMA was unavailable. Pt asked that CMA call back at her earliest convenience.    [phone]

## 2023-05-26 DIAGNOSIS — Z01419 Encounter for gynecological examination (general) (routine) without abnormal findings: Secondary | ICD-10-CM | POA: Diagnosis not present

## 2023-05-26 NOTE — Telephone Encounter (Signed)
Called and spoke with patient about labs, patient is aware and verbalized understanding, patient would also like to have a DEXA referral placed

## 2023-06-01 ENCOUNTER — Other Ambulatory Visit: Payer: Self-pay | Admitting: Family Medicine

## 2023-06-01 DIAGNOSIS — Z780 Asymptomatic menopausal state: Secondary | ICD-10-CM

## 2023-06-01 NOTE — Telephone Encounter (Signed)
Called patient and left a detail VM per [facility]

## 2023-06-01 NOTE — Telephone Encounter (Signed)
Order placed

## 2023-06-13 ENCOUNTER — Other Ambulatory Visit: Payer: Self-pay | Admitting: Family Medicine

## 2023-06-13 DIAGNOSIS — R140 Abdominal distension (gaseous): Secondary | ICD-10-CM | POA: Diagnosis not present

## 2023-06-13 DIAGNOSIS — K219 Gastro-esophageal reflux disease without esophagitis: Secondary | ICD-10-CM | POA: Diagnosis not present

## 2023-06-13 DIAGNOSIS — E739 Lactose intolerance, unspecified: Secondary | ICD-10-CM | POA: Diagnosis not present

## 2023-06-28 DIAGNOSIS — H04123 Dry eye syndrome of bilateral lacrimal glands: Secondary | ICD-10-CM | POA: Diagnosis not present

## 2023-06-28 DIAGNOSIS — H52203 Unspecified astigmatism, bilateral: Secondary | ICD-10-CM | POA: Diagnosis not present

## 2023-06-28 DIAGNOSIS — H524 Presbyopia: Secondary | ICD-10-CM | POA: Diagnosis not present

## 2023-06-30 DIAGNOSIS — H401133 Primary open-angle glaucoma, bilateral, severe stage: Secondary | ICD-10-CM | POA: Diagnosis not present

## 2023-07-07 DIAGNOSIS — J301 Allergic rhinitis due to pollen: Secondary | ICD-10-CM | POA: Diagnosis not present

## 2023-07-07 DIAGNOSIS — J3089 Other allergic rhinitis: Secondary | ICD-10-CM | POA: Diagnosis not present

## 2023-07-07 DIAGNOSIS — J3081 Allergic rhinitis due to animal (cat) (dog) hair and dander: Secondary | ICD-10-CM | POA: Diagnosis not present

## 2023-07-07 DIAGNOSIS — H1045 Other chronic allergic conjunctivitis: Secondary | ICD-10-CM | POA: Diagnosis not present

## 2023-07-27 DIAGNOSIS — H401133 Primary open-angle glaucoma, bilateral, severe stage: Secondary | ICD-10-CM | POA: Diagnosis not present

## 2023-07-27 DIAGNOSIS — H04123 Dry eye syndrome of bilateral lacrimal glands: Secondary | ICD-10-CM | POA: Diagnosis not present

## 2023-08-09 DIAGNOSIS — H1045 Other chronic allergic conjunctivitis: Secondary | ICD-10-CM | POA: Diagnosis not present

## 2023-08-11 ENCOUNTER — Telehealth (INDEPENDENT_AMBULATORY_CARE_PROVIDER_SITE_OTHER): Payer: Self-pay | Admitting: Otolaryngology

## 2023-08-11 NOTE — Telephone Encounter (Signed)
 Confirmed appt & location [id number] afm

## 2023-08-12 ENCOUNTER — Encounter (INDEPENDENT_AMBULATORY_CARE_PROVIDER_SITE_OTHER): Payer: Self-pay

## 2023-08-12 ENCOUNTER — Ambulatory Visit (INDEPENDENT_AMBULATORY_CARE_PROVIDER_SITE_OTHER): Payer: Medicare PPO | Admitting: Otolaryngology

## 2023-08-12 VITALS — HR 64 | Ht 62.0 in | Wt 156.0 lb

## 2023-08-12 DIAGNOSIS — J343 Hypertrophy of nasal turbinates: Secondary | ICD-10-CM

## 2023-08-12 DIAGNOSIS — J310 Chronic rhinitis: Secondary | ICD-10-CM

## 2023-08-12 DIAGNOSIS — R0981 Nasal congestion: Secondary | ICD-10-CM | POA: Diagnosis not present

## 2023-08-14 DIAGNOSIS — J343 Hypertrophy of nasal turbinates: Secondary | ICD-10-CM | POA: Insufficient documentation

## 2023-08-14 DIAGNOSIS — J310 Chronic rhinitis: Secondary | ICD-10-CM | POA: Insufficient documentation

## 2023-08-14 NOTE — Progress Notes (Signed)
 Patient ID: Kristy Becker, female   DOB: April 16, 1951, 73 y.o.   MRN: [id number]  Follow-up: Chronic nasal obstruction  HPI: The patient is a 73 year old female who returns today for follow-up evaluation.  The patient was previously seen for chronic nasal obstruction.  She was noted to have bilateral inferior turbinate hypertrophy.  She was treated with bilateral turbinate reduction surgery in August 2024.  The patient returns today reporting significant improvement in her nasal breathing.  She reports occasional nasal congestion, mostly during allergy seasons.  She is currently using azelastine nasal spray as needed.  She denies any facial pain, fever, or visual change.  Exam: General: Communicates without difficulty, well nourished, no acute distress. Head: Normocephalic, no evidence injury, no tenderness, facial buttresses intact without stepoff. Face/sinus: No tenderness to palpation and percussion. Facial movement is normal and symmetric. Eyes: PERRL, EOMI. No scleral icterus, conjunctivae clear. Neuro: CN II exam reveals vision grossly intact.  No nystagmus at any point of gaze. Ears: Auricles well formed without lesions.  Ear canals are intact without mass or lesion.  No erythema or edema is appreciated.  The TMs are intact without fluid. Nose: External evaluation reveals normal support and skin without lesions.  Dorsum is intact.  Anterior rhinoscopy reveals congested mucosa over anterior aspect of inferior turbinates and intact septum.  No purulence noted. Oral:  Oral cavity and oropharynx are intact, symmetric, without erythema or edema.  Mucosa is moist without lesions. Neck: Full range of motion without pain.  There is no significant lymphadenopathy.  No masses palpable.  Thyroid bed within normal limits to palpation.  Parotid glands and submandibular glands equal bilaterally without mass.  Trachea is midline. Neuro:  CN 2-12 grossly intact.   Assessment: 1.  Chronic rhinitis with mild nasal  mucosal congestion. 2.  Her inferior turbinates are well-healed.  Her nasal passageways are patent bilaterally.  Plan: 1.  The physical exam findings are reviewed with the patient. 2.  Continue the use of azelastine nasal spray as needed. 3.  The patient is encouraged to call with any questions or concerns.

## 2023-08-15 ENCOUNTER — Ambulatory Visit: Payer: Self-pay | Admitting: Family Medicine

## 2023-08-15 NOTE — Telephone Encounter (Signed)
  Chief Complaint: cough  Symptoms: coughing spells at times. Productive at times. Nasal drainage, eye itching  Frequency: couple of days  Pertinent Negatives: Patient denies chest pain no difficulty breathing no fever Disposition: [] ED /[] Urgent Care (no appt availability in office) / [x] Appointment(In office/virtual)/ []  [facility]/ [] Home Care/ [] Refused Recommended Disposition /[] [place] [facility]/ []  Follow-up with PCP Additional Notes:   Pt requesting appt . Scheduled for 08/18/23. Please advise for any OTC medications.       Copied from CRM [id number]. Topic: Clinical - Medical Advice >> Aug 15, 2023  7:57 AM Alphonzo Lemmings O wrote: Reason for CRM: patient is calling cause she has a cold and has been coughing and wants to know can the doctor call her in something or do she have to be seen . Reason for Disposition  Cough with cold symptoms (e.g., runny nose, postnasal drip, throat clearing)  Answer Assessment - Initial Assessment Questions 1. ONSET: "When did the cough begin?"      Couple of days ago  2. SEVERITY: "How bad is the cough today?"      Getting worse  3. SPUTUM: "Describe the color of your sputum" (none, dry cough; clear, white, yellow, green)     yes 4. HEMOPTYSIS: "Are you coughing up any blood?" If so ask: "How much?" (flecks, streaks, tablespoons, etc.)     na 5. DIFFICULTY BREATHING: "Are you having difficulty breathing?" If Yes, ask: "How bad is it?" (e.g., mild, moderate, severe)    - MILD: No SOB at rest, mild SOB with walking, speaks normally in sentences, can lie down, no retractions, pulse < 100.    - MODERATE: SOB at rest, SOB with minimal exertion and prefers to sit, cannot lie down flat, speaks in phrases, mild retractions, audible wheezing, pulse 100-120.    - SEVERE: Very SOB at rest, speaks in single words, struggling to breathe, sitting hunched forward, retractions, pulse > 120      denies 6. FEVER: "Do you have a fever?" If Yes, ask: "What is  your temperature, how was it measured, and when did it start?"     No  7. CARDIAC HISTORY: "Do you have any history of heart disease?" (e.g., heart attack, congestive heart failure)      Na  8. LUNG HISTORY: "Do you have any history of lung disease?"  (e.g., pulmonary embolus, asthma, emphysema)     Na  9. PE RISK FACTORS: "Do you have a history of blood clots?" (or: recent major surgery, recent prolonged travel, bedridden)     Na  10. OTHER SYMPTOMS: "Do you have any other symptoms?" (e.g., runny nose, wheezing, chest pain)       Cough productive at times. Coughing spells , nasal draining , eye itching  11. PREGNANCY: "Is there any chance you are pregnant?" "When was your last menstrual period?"       na 12. TRAVEL: "Have you traveled out of the country in the last month?" (e.g., travel history, exposures)       na  Protocols used: Cough - Acute Non-Productive-A-AH

## 2023-08-18 ENCOUNTER — Encounter: Payer: Self-pay | Admitting: Family Medicine

## 2023-08-18 ENCOUNTER — Ambulatory Visit: Admitting: Family Medicine

## 2023-08-18 VITALS — BP 136/82 | HR 60 | Temp 97.2°F | Ht 62.0 in | Wt 157.6 lb

## 2023-08-18 DIAGNOSIS — J3489 Other specified disorders of nose and nasal sinuses: Secondary | ICD-10-CM | POA: Diagnosis not present

## 2023-08-18 DIAGNOSIS — J069 Acute upper respiratory infection, unspecified: Secondary | ICD-10-CM | POA: Diagnosis not present

## 2023-08-18 DIAGNOSIS — R0982 Postnasal drip: Secondary | ICD-10-CM

## 2023-08-18 MED ORDER — BENZONATATE 100 MG PO CAPS: 100.0000 mg | ORAL_CAPSULE | Freq: Two times a day (BID) | ORAL | 0 refills | Status: DC | PRN

## 2023-08-18 NOTE — Progress Notes (Signed)
 Established Patient Office Visit   Subjective  Patient ID: Kristy Becker, female    DOB: 1951-03-25  Age: 73 y.o. MRN: [id number]  Chief Complaint  Patient presents with   Cough    Started a week ago, cough that gets worse at night, yellow/clear mucus, patient has question about which cough Medication she can take that is OTC    Patient is a 73 year old female seen for acute concern.  Patient endorses cough, initially dry, but now productive with rhinorrhea and postnasal drainage.  Cough worse at night.  Symptoms started Sunday/Monday.  Patient stayed in bed all day Tuesday due to fatigue.  Patient has noticed slight improvement in symptoms but is concerned about the deep cough as she does not want to get worse over the weekend.  Patient tried cough drops, eating soup, drinking water and juice.  Patient denies sore throat, fever, ear pain/pressure, headache, facial pain/pressure.  Patient taking Zyrtec, azelastine, and using Nettie pot for history of sinus issues.  Had f/u with Dr. Christain [place], Ent for recent sinus surgery.  Notes drastic improvement in allergies since having it done.    Patient Active Problem List   Diagnosis Date Noted   Chronic rhinitis 08/14/2023   Hypertrophy of nasal turbinates 08/14/2023   Hepatic steatosis 04/06/2023   Colon cancer screening 04/21/2021   Diverticular disease of colon 04/21/2021   Family history of malignant neoplasm of digestive organs 04/21/2021   Fatigue 04/21/2021   Flatulence, eructation and gas pain 04/21/2021   Gastroesophageal reflux disease 04/21/2021   Lactose intolerance 04/21/2021   Left lower quadrant pain 04/21/2021   Lipoma of intra-abdominal organs 04/21/2021   Other specified conditions associated with female genital organs and menstrual cycle 04/21/2021   Glaucoma 09/17/2020   Pseudophakia of both eyes 03/25/2020   Primary open-angle glaucoma 07/11/2019   Age-related nuclear cataract of both eyes 07/10/2019   AKI (acute kidney  injury) (HCC) 12/07/2016   Hyponatremia 12/07/2016   Syncope due to orthostatic hypotension    History of colonic polyps 02/26/2009   Allergic rhinitis 10/06/2007   Backache 08/21/2007   ECZEMA, HANDS 05/08/2007   Anxiety state 01/31/2007   Essential hypertension 01/31/2007   Past Medical History:  Diagnosis Date   ALLERGIC RHINITIS 10/06/2007   ANXIETY 01/31/2007   BACK PAIN 08/21/2007   COLONIC POLYPS, HX OF 02/26/2009   ECZEMA, HANDS 05/08/2007   Glaucoma    HYPERTENSION 01/31/2007   Past Surgical History:  Procedure Laterality Date   TONSILLECTOMY AND ADENOIDECTOMY     Social History   Tobacco Use   Smoking status: Never   Smokeless tobacco: Never  Substance Use Topics   Alcohol use: No   Drug use: No   History reviewed. No pertinent family history. Allergies  Allergen Reactions   Azithromycin     nausea   Cephalosporins Other (See Comments)    unknown   Ciprofloxacin Hives   Clarithromycin Other (See Comments)    unknown   Doxycycline Hyclate Other (See Comments)   Omeprazole-Sodium Bicarbonate Other (See Comments)   Penicillins Hives   Sulfamethoxazole-Trimethoprim Other (See Comments)      ROS Negative unless stated above    Objective:     BP 136/82 (BP Location: Left Arm, Patient Position: Sitting, Cuff Size: Normal)   Pulse 60   Temp (!) 97.2 F (36.2 C) (Oral)   Ht 5\' 2"  (1.575 m)   Wt 157 lb 9.6 oz (71.5 kg)   SpO2 98%   BMI 28.83  kg/m  BP Readings from Last 3 Encounters:  08/18/23 136/82  05/16/23 122/74  04/06/23 138/82   Wt Readings from Last 3 Encounters:  08/18/23 157 lb 9.6 oz (71.5 kg)  08/12/23 156 lb (70.8 kg)  05/16/23 159 lb 9.6 oz (72.4 kg)      Physical Exam Constitutional:      General: She is not in acute distress.    Appearance: Normal appearance.  HENT:     Head: Normocephalic and atraumatic.     Nose: Nose normal.     Mouth/Throat:     Mouth: Mucous membranes are moist.  Cardiovascular:     Rate and Rhythm:  Normal rate and regular rhythm.     Heart sounds: Normal heart sounds. No murmur heard.    No gallop.  Pulmonary:     Effort: Pulmonary effort is normal. No respiratory distress.     Breath sounds: Normal breath sounds. Transmitted upper airway sounds present. No wheezing, rhonchi or rales.     Comments: Deep productive cough.   Skin:    General: Skin is warm and dry.  Neurological:     Mental Status: She is alert and oriented to person, place, and time.      No results found for any visits on 08/18/23.    Assessment & Plan:  Viral URI with cough -     Benzonatate; Take 1 capsule (100 mg total) by mouth 2 (two) times daily as needed for cough.  Dispense: 20 capsule; Refill: 0  Rhinorrhea  Post-nasal drainage  Patient with acute URI symptoms including cough.  Continue supportive care with OTC cough/cold medications for people with high blood pressure given history of HTN and glaucoma.  Can also drink warm fluids, rest, take Tylenol or ibuprofen as needed, gargle with warm salt water Chloraseptic spray, honey for symptoms.  Concern deep productive cough may develop into pneumonia.  Given patient's history of numerous antibiotic allergies/intolerances would be difficult to treat.  Patient given strict precautions for any worsening symptoms.  If cough continues/becomes worse, patient develops fever, chills, etc. obtain CXR.  Return if symptoms worsen or fail to improve.   Deeann Saint, MD

## 2023-08-18 NOTE — Patient Instructions (Addendum)
 Prescription for Tessalon was sent to your pharmacy.  This is a pill that you can take for cough.  Upon looking in the chart it does not appear that you have had this before.  You can try taking it and monitor for any allergic reaction symptoms.  You can also try Coricidin HBP or Mucinex HBP which can be found over-the-counter.  These are cough/cold medications for people with high blood pressure.  You could also try plain Robitussin (the 1 without the letters behind it ).  Let us know if you feel like your symptoms are getting worse.

## 2023-08-19 ENCOUNTER — Ambulatory Visit: Payer: Self-pay | Admitting: Family Medicine

## 2023-08-19 ENCOUNTER — Telehealth: Payer: Self-pay | Admitting: Family Medicine

## 2023-08-19 NOTE — Telephone Encounter (Signed)
 Copied from CRM [id number]. Topic: Clinical - Medical Advice >> Aug 19, 2023 12:45 PM Fonda Kinder J wrote: Reason for CRM: Pt wants to know if Robotussin DM was okay for her to take without rasing her blood pressure  Callback [phone]  Chief Complaint: medication assistance Symptoms: cough Frequency:  Pertinent Negatives: NA Disposition: [] ED /[] Urgent Care (no appt availability in office) / [] Appointment(In office/virtual)/ []  [facility]/ [x] Home Care/ [] Refused Recommended Disposition /[] [place] [facility]/ []  Follow-up with PCP Additional Notes: advised pt since she does have HTN that Robitussin DM may affect her BP. She can try Robitussin plain OTC or Coricidin HBP for cough. Also advised pt that benzonatate was sent in yesterday by provider to pharmacy. Pt was going to call pharmacy since pharmacist told her yesterday that DM wouldn't affect her HTN. Advised pt to call back if rx wasn't at pharmacy. Pt verbalized understanding.   Reason for Disposition  [name] has medicine question only, adult not sick, AND triager answers question  Answer Assessment - Initial Assessment Questions 1. NAME of MEDICINE: "What medicine(s) are you calling about?"     Robitussin DM 2. QUESTION: "What is your question?" (e.g., double dose of medicine, side effect)     Pt wanting to know if she can take without affect HTN 3. PRESCRIBER: "Who prescribed the medicine?" Reason: if prescribed by specialist, call should be referred to that group.     OTC 4. SYMPTOMS: "Do you have any symptoms?" If Yes, ask: "What symptoms are you having?"  "How bad are the symptoms (e.g., mild, moderate, severe)     Cough, had visit yesterday  Protocols used: Medication Question Call-A-AH

## 2023-08-19 NOTE — Telephone Encounter (Signed)
 Called to [facility] spoke to [facility] who verified patients prescription for Tessalon 100 mg capsules are ready for pick up. Spoke with [place] to let her know medication is at the pharmacy and ready to pick up.   Copied from CRM [id number]. Topic: Clinical - Prescription Issue >> Aug 19, 2023  3:05 PM Denese Killings wrote: Reason for CRM: Patient stated that Pharmacy advised her that her prescription for benzonatate (TESSALON) 100 MG capsule is actually a cough drop instead of capsule. Please give patient a call.

## 2023-08-24 ENCOUNTER — Telehealth: Payer: Self-pay | Admitting: Family Medicine

## 2023-08-24 ENCOUNTER — Telehealth: Payer: Self-pay

## 2023-08-24 NOTE — Telephone Encounter (Signed)
 Copied from CRM [id number]. Topic: Clinical - Medication Question >> Aug 24, 2023  9:05 AM [name] wrote: Reason for CRM: Patient [phone] asking if she can take to cetirizine (ZYRTEC) 10 MG tablet while taking benzonatate (TESSALON) 100 MG capsule for coughing. Please advise and call back today, patient needs to take her allergies med.

## 2023-08-24 NOTE — Telephone Encounter (Signed)
 Done

## 2023-08-24 NOTE — Telephone Encounter (Signed)
 Spoke with patient per Dr. Salomon Fick it is okay to take both medications

## 2023-08-24 NOTE — Telephone Encounter (Signed)
 Copied from CRM [id number]. Topic: Clinical - Medication Question >> Aug 24, 2023  9:05 AM [name] wrote: Reason for CRM: Patient [phone] asking if she can take to cetirizine (ZYRTEC) 10 MG tablet while taking benzonatate (TESSALON) 100 MG capsule for coughing. Please advise and call back today, patient needs to take her allergies med. >> Aug 24, 2023  3:27 PM [name] wrote: Patient is calling following up on previous message, regarding medications. Wants to know if it's okay to take cetirizine (ZYRTEC) 10 MG tablet while taking benzonatate (TESSALON) 100 MG capsule for coughing.

## 2023-09-14 ENCOUNTER — Other Ambulatory Visit: Payer: Self-pay | Admitting: Family Medicine

## 2023-09-14 DIAGNOSIS — Z1231 Encounter for screening mammogram for malignant neoplasm of breast: Secondary | ICD-10-CM

## 2023-09-26 ENCOUNTER — Ambulatory Visit (INDEPENDENT_AMBULATORY_CARE_PROVIDER_SITE_OTHER): Payer: Medicare PPO

## 2023-09-26 VITALS — Ht 62.0 in | Wt 157.0 lb

## 2023-09-26 DIAGNOSIS — Z0000 Encounter for general adult medical examination without abnormal findings: Secondary | ICD-10-CM | POA: Diagnosis not present

## 2023-09-26 NOTE — Patient Instructions (Addendum)
 Ms. Prettyman , Thank you for taking time to come for your Medicare Wellness Visit. I appreciate your ongoing commitment to your health goals. Please review the following plan we discussed and let me know if I can assist you in the future.   Referrals/Orders/Follow-Ups/Clinician Recommendations:   This is a list of the screening recommended for you and due dates:  Health Maintenance  Topic Date Due   Hepatitis C Screening  Never done   DTaP/Tdap/Td vaccine (2 - Tdap) 09/13/2019   COVID-19 Vaccine (5 - 2024-25 season) 02/13/2023   Mammogram  10/11/2023   Flu Shot  01/13/2024   Medicare Annual Wellness Visit  09/25/2024   Colon Cancer Screening  04/14/2032   Pneumonia Vaccine  Completed   DEXA scan (bone density measurement)  Completed   HPV Vaccine  Aged Out   Meningitis B Vaccine  Aged Out   Zoster (Shingles) Vaccine  Discontinued    Advanced directives: (Copy Requested) Please bring a copy of your health care power of attorney and living will to the office to be added to your chart at your convenience. You can mail to [facility] [address] [place], [place] [address] or email to [email or web address]  Next Medicare Annual Wellness Visit scheduled for next year: Yes

## 2023-09-26 NOTE — Progress Notes (Signed)
 Subjective:   Kristy Becker is a 73 y.o. who presents for a Medicare Wellness preventive visit.  Visit Complete: Virtual I connected with  [name] on 09/26/23 by a audio enabled telemedicine application and verified that I am speaking with the correct person using two identifiers.  Patient Location: Home  Provider Location: Home Office  I discussed the limitations of evaluation and management by telemedicine. The patient expressed understanding and agreed to proceed.  Vital Signs: Because this visit was a virtual/telehealth visit, some criteria may be missing or patient reported. Any vitals not documented were not able to be obtained and vitals that have been documented are patient reported.    Persons Participating in Visit: Patient.  AWV Questionnaire: No: Patient Medicare AWV questionnaire was not completed prior to this visit.  Cardiac Risk Factors include: advanced age (>30men, >58 women);hypertension     Objective:    Today's Vitals   09/26/23 1405  Weight: 157 lb (71.2 kg)  Height: 5\' 2"  (1.575 m)   Body mass index is 28.72 kg/m.     09/26/2023    2:16 PM 06/25/2022   10:54 AM 01/09/2020    8:23 AM 12/07/2016    1:50 PM  Advanced Directives  Does Patient Have a Medical Advance Directive? Yes Yes Yes No  Type of Estate agent of [place];Living will Healthcare Power of [place];Living will Healthcare Power of [place];Living will   Does patient want to make changes to medical advance directive?   No - Patient declined   Copy of Healthcare Power of Attorney in Chart? No - copy requested No - copy requested No - copy requested   Would patient like information on creating a medical advance directive?    No - Patient declined    Current Medications (verified) Outpatient Encounter Medications as of 09/26/2023  Medication Sig   azelastine (ASTELIN) 0.1 % nasal spray 1-2 sprays in each nostril Nasally Twice a day for 30 day(s)    benazepril-hydrochlorthiazide (LOTENSIN HCT) 20-12.5 MG tablet TAKE 1 TABLET BY MOUTH EVERY DAY   benzonatate (TESSALON) 100 MG capsule Take 1 capsule (100 mg total) by mouth 2 (two) times daily as needed for cough. (Patient not taking: Reported on 09/26/2023)   cetirizine (ZYRTEC) 10 MG tablet TAKE 1 TABLET BY MOUTH EVERY DAY Orally Once a day for 90 days   cyclobenzaprine (FLEXERIL) 5 MG tablet Take 1 tablet (5 mg total) by mouth at bedtime as needed for muscle spasms. (Patient not taking: Reported on 09/26/2023)   diclofenac Sodium (VOLTAREN) 1 % GEL Apply 2 g topically daily as needed. Rub into affected area of foot 2 to 4 times daily   diphenhydrAMINE (BENADRYL ALLERGY) 25 mg capsule 1 capsule as needed Orally every 6 hrs   EPINEPHrine (EPIPEN 2-PAK) 0.3 mg/0.3 mL IJ SOAJ injection as directed Injection as needed for 30 days   famotidine (PEPCID) 20 MG tablet Take 20 mg by mouth 2 (two) times daily. OTC   fexofenadine (ALLEGRA ALLERGY) 60 MG tablet Take 1 tablet (60 mg total) by mouth daily. (Patient not taking: Reported on 09/26/2023)   HYDROcodone-acetaminophen (NORCO/VICODIN) 5-325 MG tablet Take 1-2 tablets by mouth every 6 (six) hours as needed. (Patient not taking: Reported on 09/26/2023)   hydrOXYzine (ATARAX/VISTARIL) 25 MG tablet    IYUZEH 0.005 % SOLN Place 1 drop into the right eye at bedtime.   IYUZEH 0.005 % SOLN Place 1 drop into the right eye at bedtime.   MOMETASONE FUROATE  NA Place 2 sprays into the nose daily. 50 mcg   montelukast (SINGULAIR) 10 MG tablet Take 1 tablet (10 mg total) by mouth at bedtime.   Multiple Vitamin (MULTIVITAMIN WITH MINERALS) TABS Take 1 tablet by mouth daily.   Multiple Vitamins-Minerals (CENTRUM SILVER 50+WOMEN) TABS Take by mouth daily. OTC   nitrofurantoin, macrocrystal-monohydrate, (MACROBID) 100 MG capsule Take 1 capsule (100 mg total) by mouth 2 (two) times daily. (Patient not taking: Reported on 09/26/2023)   Probiotic Product (PROBIOTIC DAILY PO)  Take by mouth.   RESTASIS 0.05 % ophthalmic emulsion    XIIDRA 5 % SOLN Apply 1 drop to eye 2 (two) times daily.   No facility-administered encounter medications on file as of 09/26/2023.    Allergies (verified) Azithromycin, Cephalosporins, Ciprofloxacin, Clarithromycin, Doxycycline hyclate, Omeprazole-sodium bicarbonate, Penicillins, and Sulfamethoxazole-trimethoprim   History: Past Medical History:  Diagnosis Date   ALLERGIC RHINITIS 10/06/2007   ANXIETY 01/31/2007   BACK PAIN 08/21/2007   COLONIC POLYPS, HX OF 02/26/2009   ECZEMA, HANDS 05/08/2007   Glaucoma    HYPERTENSION 01/31/2007   Past Surgical History:  Procedure Laterality Date   TONSILLECTOMY AND ADENOIDECTOMY     History reviewed. No pertinent family history. Social History   Socioeconomic History   Marital status: Married    Spouse name: Not on file   Number of children: Not on file   Years of education: Not on file   Highest education level: Not on file  Occupational History   Not on file  Tobacco Use   Smoking status: Never   Smokeless tobacco: Never  Substance and Sexual Activity   Alcohol use: No   Drug use: No   Sexual activity: Not on file  Other Topics Concern   Not on file  Social History Narrative   Not on file   Social Drivers of Health   Financial Resource Strain: Low Risk  (09/26/2023)   Overall Financial Resource Strain (CARDIA)    Difficulty of Paying Living Expenses: Not hard at all  Food Insecurity: No Food Insecurity (09/26/2023)   Hunger Vital Sign    Worried About Running Out of Food in the Last Year: Never true    Ran Out of Food in the Last Year: Never true  Transportation Needs: No Transportation Needs (09/26/2023)   PRAPARE - Administrator, Civil Service (Medical): No    Lack of Transportation (Non-Medical): No  Physical Activity: Insufficiently Active (09/26/2023)   Exercise Vital Sign    Days of Exercise per Week: 2 days    Minutes of Exercise per Session: 30 min   Stress: No Stress Concern Present (09/26/2023)   [employer] of Occupational Health - Occupational Stress Questionnaire    Feeling of Stress : Not at all  Social Connections: Moderately Integrated (09/26/2023)   Social Connection and Isolation Panel [NHANES]    Frequency of Communication with Friends and Family: More than three times a week    Frequency of Social Gatherings with Friends and Family: More than three times a week    Attends Religious Services: More than 4 times per year    Active Member of [employer] or Organizations: Yes    Attends Banker Meetings: More than 4 times per year    Marital Status: Widowed    Tobacco Counseling Counseling given: Not Answered    Clinical Intake:  Pre-visit preparation completed: Yes  Pain : No/denies pain     BMI - recorded: 28.72 Nutritional Status: BMI 25 -29  Overweight Nutritional Risks: None Diabetes: No  Lab Results  Component Value Date   HGBA1C 5.6 10/21/2021   HGBA1C 5.7 03/16/2021     How often do you need to have someone help you when you read instructions, pamphlets, or other written materials from your doctor or pharmacy?: 1 - Never  Interpreter Needed?: No  Information entered by :: Theresa Mulligan LPN   Activities of Daily Living     09/26/2023    2:15 PM  In your present state of health, do you have any difficulty performing the following activities:  Hearing? 0  Vision? 0  Difficulty concentrating or making decisions? 0  Walking or climbing stairs? 0  Dressing or bathing? 0  Doing errands, shopping? 0  Preparing Food and eating ? N  Using the Toilet? N  In the past six months, have you accidently leaked urine? N  Do you have problems with loss of bowel control? N  Managing your Medications? N  Managing your Finances? N  Housekeeping or managing your Housekeeping? N    Patient Care Team: Deeann Saint, MD as PCP - General (Family Medicine) Jodelle Red, MD as PCP -  Cardiology (Cardiology)  Indicate any recent Medical Services you may have received from other than [facility] providers in the past year (date may be approximate).     Assessment:   This is a routine wellness examination for El[place].  Hearing/Vision screen Hearing Screening - Comments:: Denies hearing difficulties   Vision Screening - Comments:: Wears rx glasses - up to date with routine eye exams with  [facility]   Goals Addressed               This Visit's Progress     Increase physical activity (pt-stated)        Remain active.       Depression Screen     09/26/2023    2:13 PM 08/18/2023   11:12 AM 05/16/2023    2:54 PM 04/06/2023   10:16 AM 02/24/2023    1:13 PM 07/23/2022    3:24 PM 06/25/2022   10:51 AM  PHQ 2/9 Scores  PHQ - 2 Score 0 0 0 0 1 1 0  PHQ- 9 Score 0 0 0 0 3 1 0    Fall Risk     09/26/2023    2:15 PM 08/18/2023   11:12 AM 05/16/2023    2:17 PM 02/24/2023    1:13 PM 07/08/2022   10:47 AM  Fall Risk   Falls in the past year? 0 0 1 1 0  Number falls in past yr: 0 0 1  0  Injury with Fall? 0 0 0 1 0  Risk for fall due to : No Fall Risks    No Fall Risks  Follow up Falls prevention discussed;Falls evaluation completed  Falls evaluation completed Falls evaluation completed Falls evaluation completed    MEDICARE RISK AT HOME:  Medicare Risk at Home Any stairs in or around the home?: No If so, are there any without handrails?: No Home free of loose throw rugs in walkways, pet beds, electrical cords, etc?: Yes Adequate lighting in your home to reduce risk of falls?: Yes Life alert?: No Use of a cane, walker or w/c?: No Grab bars in the bathroom?: Yes Shower chair or bench in shower?: No Elevated toilet seat or a handicapped toilet?: No  TIMED UP AND GO:  Was the test performed?  No  Cognitive Function: 6CIT completed  09/26/2023    2:16 PM 06/25/2022   10:54 AM 01/09/2020    8:31 AM  6CIT Screen  What Year? 0 points 0 points 0 points   What month? 0 points 0 points 0 points  What time? 0 points 0 points 0 points  Count back from 20 0 points 2 points 0 points  Months in reverse 0 points 2 points 0 points  Repeat phrase 0 points 0 points 4 points  Total Score 0 points 4 points 4 points    Immunizations Immunization History  Administered Date(s) Administered   Fluad Quad(high Dose 65+) 04/26/2019, 02/23/2021, 04/23/2022   Fluad Trivalent(High Dose 65+) 04/06/2023   Influenza, High Dose Seasonal PF 04/11/2015, 04/15/2016, 08/31/2016, 03/31/2017, 08/30/2017, 05/01/2018, 06/28/2019   Influenza,inj,Quad PF,6+ Mos 04/06/2013, 04/08/2014   Influenza-Unspecified 03/14/2016   PFIZER(Purple Top)SARS-COV-2 Vaccination 07/04/2019, 07/25/2019, 04/15/2020, 01/21/2021   Pneumococcal Conjugate-13 05/28/2015   Pneumococcal Polysaccharide-23 06/02/2016, 08/31/2016, 08/30/2017, 04/19/2018, 04/24/2019, 06/28/2019   Td 09/12/2009   Zoster Recombinant(Shingrix) 06/23/2021    Screening Tests Health Maintenance  Topic Date Due   Hepatitis C Screening  Never done   DTaP/Tdap/Td (2 - Tdap) 09/13/2019   COVID-19 Vaccine (5 - 2024-25 season) 02/13/2023   MAMMOGRAM  10/11/2023   INFLUENZA VACCINE  01/13/2024   Medicare Annual Wellness (AWV)  09/25/2024   Colonoscopy  04/14/2032   Pneumonia Vaccine 92+ Years old  Completed   DEXA SCAN  Completed   HPV VACCINES  Aged Out   Meningococcal B Vaccine  Aged Out   Zoster Vaccines- Shingrix  Discontinued    Health Maintenance  Health Maintenance Due  Topic Date Due   Hepatitis C Screening  Never done   DTaP/Tdap/Td (2 - Tdap) 09/13/2019   COVID-19 Vaccine (5 - 2024-25 season) 02/13/2023   Health Maintenance Items Addressed: Hepatitis C Screening and Vaccines deferred  Additional Screening:  Vision Screening: Recommended annual ophthalmology exams for early detection of glaucoma and other disorders of the eye.  Dental Screening: Recommended annual dental exams for proper oral  hygiene  Community Resource Referral / Chronic Care Management: CRR required this visit?  No   CCM required this visit?  No     Plan:     I have personally reviewed and noted the following in the patient's chart:   Medical and social history Use of alcohol, tobacco or illicit drugs  Current medications and supplements including opioid prescriptions. Patient is not currently taking opioid prescriptions. Functional ability and status Nutritional status Physical activity Advanced directives List of other physicians Hospitalizations, surgeries, and ER visits in previous 12 months Vitals Screenings to include cognitive, depression, and falls Referrals and appointments  In addition, I have reviewed and discussed with patient certain preventive protocols, quality metrics, and best practice recommendations. A written personalized care plan for preventive services as well as general preventive health recommendations were provided to patient.     Dewayne Ford, LPN   02/26/7828   After Visit Summary: (MyChart) Due to this being a telephonic visit, the after visit summary with patients personalized plan was offered to patient via MyChart   Notes: Nothing significant to report at this time.

## 2023-09-28 NOTE — Progress Notes (Addendum)
 Provider available on day of service.  Cinda Craze, MD

## 2023-09-29 DIAGNOSIS — H401133 Primary open-angle glaucoma, bilateral, severe stage: Secondary | ICD-10-CM | POA: Diagnosis not present

## 2023-10-13 ENCOUNTER — Ambulatory Visit
Admission: RE | Admit: 2023-10-13 | Discharge: 2023-10-13 | Disposition: A | Discharge status: Home / Self Care | Source: Ambulatory Visit | Attending: Family Medicine | Admitting: Family Medicine

## 2023-10-13 DIAGNOSIS — Z1231 Encounter for screening mammogram for malignant neoplasm of breast: Secondary | ICD-10-CM | POA: Diagnosis not present

## 2023-10-18 ENCOUNTER — Other Ambulatory Visit: Payer: Self-pay | Admitting: Family Medicine

## 2023-10-18 DIAGNOSIS — R928 Other abnormal and inconclusive findings on diagnostic imaging of breast: Secondary | ICD-10-CM

## 2023-10-19 ENCOUNTER — Telehealth: Payer: Self-pay

## 2023-10-19 NOTE — Telephone Encounter (Signed)
 Copied from CRM [id number]. Topic: Clinical - Lab/Test Results >> Oct 19, 2023  2:22 PM Kita Perish H wrote: Reason for CRM: Patient is calling stating the breast center told her to reach out to Dr. Arliss Lam for her mammogram results, also states she was told by breast center she had to have mammogram repeated and also told Dr. Arliss Lam will explain why, please reach out.  Jaslynne [phone]

## 2023-10-20 NOTE — Telephone Encounter (Signed)
 Typically the radiologist performing the mammogram contacts the pt regarding any concerns seen on imaging.  Per chart review of mammogram report there were concerns of asymmetry in both breast warranting additional imaging such as diagnostic mammogram and ultrasound.

## 2023-10-21 DIAGNOSIS — E739 Lactose intolerance, unspecified: Secondary | ICD-10-CM | POA: Diagnosis not present

## 2023-10-21 DIAGNOSIS — Z800 Family history of malignant neoplasm of digestive organs: Secondary | ICD-10-CM | POA: Diagnosis not present

## 2023-10-21 DIAGNOSIS — K219 Gastro-esophageal reflux disease without esophagitis: Secondary | ICD-10-CM | POA: Diagnosis not present

## 2023-10-21 NOTE — Telephone Encounter (Signed)
 Spoke with patient, she is aware and has appt set-up for 6/5

## 2023-10-26 ENCOUNTER — Ambulatory Visit: Payer: Self-pay

## 2023-10-26 NOTE — Telephone Encounter (Signed)
  Chief Complaint: medication question Symptoms: sneezing, watery and itchy eyes, itchy top of mouth Disposition: [] ED /[] Urgent Care (no appt availability in office) / [] Appointment(In office/virtual)/ []  [facility]/ [] Home Care/ [] Refused Recommended Disposition /[] [facility]/ [x]  Information Only Additional Notes: Patient asked if she can take Claritin  at night with her Zyrtec. Advised patient that due to both medications being oral antihistamine it is not advised to combine them. Offered patient to schedule an appointment with PCP to address her seasonal allergies and she declined. Patient states if her symptoms worsen she will call back to make an appointment.  Copied from CRM [id number]. Topic: Clinical - Medication Question >> Oct 26, 2023  1:17 PM Magdalene School wrote: Reason for CRM: Patient calling to check if it is okay to take cetirizine (ZYRTEC) 10 MG tablet and Claritin  together at nighttime. Reason for Disposition  [name] has medicine question, adult has minor symptoms, caller declines triage, AND triager answers question  Answer Assessment - Initial Assessment Questions 1. NAME of MEDICINE: "What medicine(s) are you calling about?"     Cetirizine.  2. QUESTION: "What is your question?" (e.g., double dose of medicine, side effect)     Patient asking if she can take Claritin  with her Zyrtec.  3. PRESCRIBER: "Who prescribed the medicine?" Reason: if prescribed by specialist, call should be referred to that group.     Historical provider.  4. SYMPTOMS: "Do you have any symptoms?" If Yes, ask: "What symptoms are you having?"  "How bad are the symptoms (e.g., mild, moderate, severe)     Watery, itchy eyes; sneezing, itchy top of mouth. She states the rain and changes in weather have made her allergies worse.   5. PREGNANCY:  "Is there any chance that you are pregnant?" "When was your last menstrual period?"     N/A.  Protocols used: Medication Question  Call-A-AH

## 2023-11-14 ENCOUNTER — Ambulatory Visit
Admission: RE | Admit: 2023-11-14 | Discharge: 2023-11-14 | Disposition: A | Discharge status: Home / Self Care | Source: Ambulatory Visit | Attending: Family Medicine | Admitting: Family Medicine

## 2023-11-14 DIAGNOSIS — N6011 Diffuse cystic mastopathy of right breast: Secondary | ICD-10-CM | POA: Diagnosis not present

## 2023-11-14 DIAGNOSIS — N6012 Diffuse cystic mastopathy of left breast: Secondary | ICD-10-CM | POA: Diagnosis not present

## 2023-11-14 DIAGNOSIS — R928 Other abnormal and inconclusive findings on diagnostic imaging of breast: Secondary | ICD-10-CM

## 2023-11-22 DIAGNOSIS — H018 Other specified inflammations of eyelid: Secondary | ICD-10-CM | POA: Diagnosis not present

## 2023-11-22 DIAGNOSIS — H401133 Primary open-angle glaucoma, bilateral, severe stage: Secondary | ICD-10-CM | POA: Diagnosis not present

## 2023-11-22 DIAGNOSIS — H04123 Dry eye syndrome of bilateral lacrimal glands: Secondary | ICD-10-CM | POA: Diagnosis not present

## 2023-12-05 ENCOUNTER — Other Ambulatory Visit: Payer: Self-pay | Admitting: Family Medicine

## 2023-12-06 ENCOUNTER — Other Ambulatory Visit: Payer: Self-pay | Admitting: Family Medicine

## 2023-12-06 MED ORDER — BENAZEPRIL-HYDROCHLOROTHIAZIDE 20-12.5 MG PO TABS: 1.0000 | ORAL_TABLET | Freq: Every day | ORAL | 1 refills | Status: DC

## 2023-12-06 NOTE — Telephone Encounter (Signed)
 Copied from CRM [id number]. Topic: Clinical - Medication Refill >> Dec 06, 2023 10:13 AM Tanazia G wrote: Medication: benazepril -hydrochlorthiazide (LOTENSIN  HCT) 20-12.5 MG tablet  Has the patient contacted their pharmacy? Yes (Agent: If no, request that the patient contact the pharmacy for the refill. If patient does not wish to contact the pharmacy document the reason why and proceed with request.) (Agent: If yes, when and what did the pharmacy advise?)  This is the patient's preferred pharmacy:  [id number] [place], [place] - [address] [place] [address] Phone: [phone] Fax: [phone]  Is this the correct pharmacy for this prescription? Yes If no, delete pharmacy and type the correct one.   Has the prescription been filled recently? Yes  Is the patient out of the medication? Yes  Has the patient been seen for an appointment in the last year OR does the patient have an upcoming appointment? Yes  Can we respond through MyChart? Yes  Agent: Please be advised that Rx refills may take up to 3 business days. We ask that you follow-up with your pharmacy.

## 2023-12-08 ENCOUNTER — Encounter: Payer: Self-pay | Admitting: Family Medicine

## 2023-12-08 ENCOUNTER — Ambulatory Visit: Admitting: Family Medicine

## 2023-12-08 VITALS — BP 144/76 | HR 64 | Temp 98.1°F | Ht 62.0 in | Wt 157.4 lb

## 2023-12-08 DIAGNOSIS — I10 Essential (primary) hypertension: Secondary | ICD-10-CM

## 2023-12-08 DIAGNOSIS — M25472 Effusion, left ankle: Secondary | ICD-10-CM | POA: Diagnosis not present

## 2023-12-08 DIAGNOSIS — F411 Generalized anxiety disorder: Secondary | ICD-10-CM | POA: Diagnosis not present

## 2023-12-08 DIAGNOSIS — M25471 Effusion, right ankle: Secondary | ICD-10-CM

## 2023-12-08 DIAGNOSIS — L659 Nonscarring hair loss, unspecified: Secondary | ICD-10-CM

## 2023-12-08 LAB — CBC WITH DIFFERENTIAL/PLATELET
Basophils Absolute: 0 10*3/uL (ref 0.0–0.1)
Basophils Relative: 0.6 % (ref 0.0–3.0)
Eosinophils Absolute: 0.1 10*3/uL (ref 0.0–0.7)
Eosinophils Relative: 3.2 % (ref 0.0–5.0)
HCT: 38.9 % (ref 36.0–46.0)
Hemoglobin: 13 g/dL (ref 12.0–15.0)
Lymphocytes Relative: 34.6 % (ref 12.0–46.0)
Lymphs Abs: 1.5 10*3/uL (ref 0.7–4.0)
MCHC: 33.4 g/dL (ref 30.0–36.0)
MCV: 89.9 fl (ref 78.0–100.0)
Monocytes Absolute: 0.5 10*3/uL (ref 0.1–1.0)
Monocytes Relative: 11.7 % (ref 3.0–12.0)
Neutro Abs: 2.2 10*3/uL (ref 1.4–7.7)
Neutrophils Relative %: 49.9 % (ref 43.0–77.0)
Platelets: 244 10*3/uL (ref 150.0–400.0)
RBC: 4.32 Mil/uL (ref 3.87–5.11)
RDW: 13.3 % (ref 11.5–15.5)
WBC: 4.3 10*3/uL (ref 4.0–10.5)

## 2023-12-08 LAB — BRAIN NATRIURETIC PEPTIDE: Pro B Natriuretic peptide (BNP): 30 pg/mL (ref 0.0–100.0)

## 2023-12-08 LAB — COMPREHENSIVE METABOLIC PANEL WITH GFR
ALT: 13 U/L (ref 0–35)
AST: 14 U/L (ref 0–37)
Albumin: 4 g/dL (ref 3.5–5.2)
Alkaline Phosphatase: 56 U/L (ref 39–117)
BUN: 13 mg/dL (ref 6–23)
CO2: 29 meq/L (ref 19–32)
Calcium: 9.4 mg/dL (ref 8.4–10.5)
Chloride: 103 meq/L (ref 96–112)
Creatinine, Ser: 0.82 mg/dL (ref 0.40–1.20)
GFR: 70.58 mL/min (ref >60.00)
Glucose, Bld: 96 mg/dL (ref 70–99)
Potassium: 3.8 meq/L (ref 3.5–5.1)
Sodium: 139 meq/L (ref 135–145)
Total Bilirubin: 0.4 mg/dL (ref 0.2–1.2)
Total Protein: 7.4 g/dL (ref 6.0–8.3)

## 2023-12-08 LAB — TSH: TSH: 0.31 u[IU]/mL — ABNORMAL LOW (ref 0.35–5.50)

## 2023-12-08 LAB — T4, FREE: Free T4: 0.91 ng/dL (ref 0.60–1.60)

## 2023-12-08 MED ORDER — BENAZEPRIL-HYDROCHLOROTHIAZIDE 20-12.5 MG PO TABS: 1.0000 | ORAL_TABLET | Freq: Every day | ORAL | 3 refills | Status: AC

## 2023-12-08 NOTE — Progress Notes (Signed)
 Established Patient Office Visit   Subjective  Patient ID: Kristy Becker, female    DOB: Oct 23, 1950  Age: 73 y.o. MRN: [id number]  Chief Complaint  Patient presents with   Medical Management of Chronic Issues    Bilateral Ankle swelling started over a year ago and is getting worse, patient denies any pain    Pt is a 73 year old female seen for ongoing concern.  Patient endorses bilateral ankle edema x 1 year or more.  Edema seems to be increasing with it being warmer outside.  Ankles are not painful, without erythema.  Patient denies increased sodium intake but may notice an increase in symptoms if has salted popcorn.  Denies SOB, CP, DOE, orthopnea.  Edema improves upon waking up the next day.  Noticing less edema since increasing p.o. intake of water to 96 ounces per day at the advice of trainer.  Patient also notes feeling better overall when working out twice a week.  Patient inquires about BP medication causing hair loss.  Taking benazepril -hydrochlorothiazide  20-12.5 mg daily.  Noticing thinning centrally.  Denies patchy hair loss or hair breaking off.  No family history of female pattern baldness.  Patient inquires about hair vitamins but concerned it may worsen glaucoma.  BP at home well-controlled, typically elevated in clinic due to GAD.  Requesting refill on benazepril -hydrochlorothiazide  20-12.5 mg daily.    Patient Active Problem List   Diagnosis Date Noted   Chronic rhinitis 08/14/2023   Hypertrophy of nasal turbinates 08/14/2023   Hepatic steatosis 04/06/2023   Colon cancer screening 04/21/2021   Diverticular disease of colon 04/21/2021   Family history of malignant neoplasm of digestive organs 04/21/2021   Fatigue 04/21/2021   Flatulence, eructation and gas pain 04/21/2021   Gastroesophageal reflux disease 04/21/2021   Lactose intolerance 04/21/2021   Left lower quadrant pain 04/21/2021   Lipoma of intra-abdominal organs 04/21/2021   Other specified conditions  associated with female genital organs and menstrual cycle 04/21/2021   Glaucoma 09/17/2020   Pseudophakia of both eyes 03/25/2020   Primary open-angle glaucoma 07/11/2019   Age-related nuclear cataract of both eyes 07/10/2019   AKI (acute kidney injury) (HCC) 12/07/2016   Hyponatremia 12/07/2016   Syncope due to orthostatic hypotension    History of colonic polyps 02/26/2009   Allergic rhinitis 10/06/2007   Backache 08/21/2007   ECZEMA, HANDS 05/08/2007   Anxiety state 01/31/2007   Essential hypertension 01/31/2007   Past Medical History:  Diagnosis Date   ALLERGIC RHINITIS 10/06/2007   ANXIETY 01/31/2007   BACK PAIN 08/21/2007   COLONIC POLYPS, HX OF 02/26/2009   ECZEMA, HANDS 05/08/2007   Glaucoma    HYPERTENSION 01/31/2007   Past Surgical History:  Procedure Laterality Date   TONSILLECTOMY AND ADENOIDECTOMY     Social History   Tobacco Use   Smoking status: Never   Smokeless tobacco: Never  Substance Use Topics   Alcohol use: No   Drug use: No   History reviewed. No pertinent family history. Allergies  Allergen Reactions   Azithromycin     nausea   Cephalosporins Other (See Comments)    unknown   Ciprofloxacin  Hives   Clarithromycin Other (See Comments)    unknown   Doxycycline Hyclate Other (See Comments)   Omeprazole-Sodium Bicarbonate Other (See Comments)   Penicillins Hives   Sulfamethoxazole-Trimethoprim Other (See Comments)    ROS Negative unless stated above    Objective:     BP (!) 144/76 (BP Location: Left Arm, Patient Position: Sitting, Cuff  Size: Normal)   Pulse 64   Temp 98.1 F (36.7 C) (Oral)   Ht 5' 2 (1.575 m)   Wt 157 lb 6.4 oz (71.4 kg)   SpO2 98%   BMI 28.79 kg/m  BP Readings from Last 3 Encounters:  12/08/23 (!) 144/76  08/18/23 136/82  05/16/23 122/74   Wt Readings from Last 3 Encounters:  12/08/23 157 lb 6.4 oz (71.4 kg)  09/26/23 157 lb (71.2 kg)  08/18/23 157 lb 9.6 oz (71.5 kg)      Physical Exam Constitutional:       General: She is not in acute distress.    Appearance: Normal appearance.  HENT:     Head: Normocephalic and atraumatic.     Nose: Nose normal.     Mouth/Throat:     Mouth: Mucous membranes are moist.   Cardiovascular:     Rate and Rhythm: Normal rate and regular rhythm.     Heart sounds: Normal heart sounds. No murmur heard.    No gallop.     Comments: Bilateral ankle edema, nonpitting.  Trace edema of lower LEs bilaterally. Pulmonary:     Effort: Pulmonary effort is normal. No respiratory distress.     Breath sounds: Normal breath sounds. No wheezing, rhonchi or rales.   Skin:    General: Skin is warm and dry.     Comments: Central thinning of hair and thinning of the edges.   Neurological:     Mental Status: She is alert and oriented to person, place, and time.       12/08/2023   10:35 AM 09/26/2023    2:13 PM 08/18/2023   11:12 AM  Depression screen PHQ 2/9  Decreased Interest 0 0 0  Down, Depressed, Hopeless 1 0 0  PHQ - 2 Score 1 0 0  Altered sleeping 0 0 0  Tired, decreased energy 0 0 0  Change in appetite 0 0 0  Feeling bad or failure about yourself  0 0 0  Trouble concentrating 0 0 0  Moving slowly or fidgety/restless 0 0 0  Suicidal thoughts 0 0 0  PHQ-9 Score 1 0 0  Difficult doing work/chores Not difficult at all Not difficult at all Not difficult at all      12/08/2023   10:35 AM 08/18/2023   11:13 AM 05/16/2023    2:54 PM 04/06/2023   10:17 AM  GAD 7 : Generalized Anxiety Score  Nervous, Anxious, on Edge 1 1 1 1   Control/stop worrying 0 0 1 1  Worry too much - different things 0 1 1 1   Trouble relaxing 0 0 0 1  Restless 0 0 0 0  Easily annoyed or irritable 0 0 0 0  Afraid - awful might happen 1 0 0 1  Total GAD 7 Score 2 2 3 5   Anxiety Difficulty  Not difficult at all Not difficult at all Not difficult at all     No results found for any visits on 12/08/23.    Assessment & Plan:   Ankle edema, bilateral -     CBC with  Differential/Platelet; Future -     TSH; Future -     T4, free; Future -     Brain natriuretic peptide; Future -     Comprehensive metabolic panel with GFR; Future  GAD (generalized anxiety disorder) -     TSH; Future -     T4, free; Future  Essential hypertension -     TSH; Future -  T4, free; Future -     Comprehensive metabolic panel with GFR; Future -     Benazepril -hydroCHLOROthiazide ; Take 1 tablet by mouth daily.  Dispense: 90 tablet; Refill: 3  Hair loss -     Iron, TIBC and Ferritin Panel; Future -     CBC with Differential/Platelet; Future -     TSH; Future -     T4, free; Future -     Comprehensive metabolic panel with GFR; Future -     Testosterone,Free and Total; Future  Patient with bilateral ankle edema.  Discussed possible causes including dependent edema, electrolyte or thyroid  abnormality.  Obtain labs.  Discussed supportive care including elevating LEs, compression socks or TED hose, decreasing sodium intake.  Also discussed increasing HCTZ component of benazepril -hydrochlorothiazide  20-12.5 mg.  Patient declines at this time.  BP likely elevated 2/2 GAD causing white coat HTN.  Recheck.  Continue lifestyle modifications and current meds.  Obtain labs to evaluate central thinning of hair.  If labs normal discussed switching BP medication to see if improvement noted.  Patient can also try hair vitamins or Rogaine.  Return in about 3 months (around 03/09/2024), or if symptoms worsen or fail to improve.   Clotilda JONELLE Single, MD

## 2023-12-09 LAB — IRON,TIBC AND FERRITIN PANEL
%SAT: 33 % (ref 16–45)
Ferritin: 215 ng/mL (ref 16–288)
Iron: 88 ug/dL (ref 45–160)
TIBC: 268 ug/dL (ref 250–450)

## 2023-12-10 LAB — TESTOSTERONE,FREE AND TOTAL
Testosterone, Free: 3 pg/mL (ref 0.0–4.2)
Testosterone: 37 ng/dL (ref 3–67)

## 2023-12-12 ENCOUNTER — Ambulatory Visit: Payer: Self-pay | Admitting: Family Medicine

## 2024-01-05 ENCOUNTER — Ambulatory Visit: Payer: Self-pay

## 2024-01-05 NOTE — Telephone Encounter (Signed)
 Reason for Disposition . [1] DOUBLE DOSE (an extra dose or lesser amount) of prescription drug AND [2] NO symptoms  (Exception: A double dose of antibiotics.)    Possibly skipped dose  Answer Assessment - Initial Assessment Questions 1. NAME of MEDICINE: What medicine(s) are you calling about?     Blood pressure medication-Lotensin   2. QUESTION: What is your question? (e.g., double dose of medicine, side effect)     Can't remember if she took it and wondering if she should take it or wait until morning. She thinks she did take it but not really sure.   3. SYMPTOMS: Do you have any symptoms? If Yes, ask: What symptoms are you having?  How bad are the symptoms (e.g., mild, moderate, severe)     Denies   Additional info: Has not taken blood pressure this morning, she is out at the grocery store currently, she will measure her blood pressure when she returns home and call back to report reading. Currently asymptomatic.  Please advise on lotensin  dose for today  Protocols used: Medication Question Call-A-AH

## 2024-01-05 NOTE — Telephone Encounter (Addendum)
 FYI Only or Action Required?: Action required by provider: clinical question for provider.  Patient was last seen in primary care on 12/08/2023 by Mercer Clotilda SAUNDERS, MD.  Called Nurse Triage reporting medication question.  Symptoms began today.  Interventions attempted: Nothing.  Symptoms are: unchanged.  Triage Disposition: Call PCP Now  Patient/caregiver understands and will follow disposition?: Yes 1st attempt [id number]  Message from Juncal C sent at 01/05/2024  9:29 AM EDT  Patient called in stated she believes she took her blood pressure this morning but doesn't remember, wanted to know if she should just take it or not take it . Would like a nurse to give her a callback regarding this

## 2024-01-05 NOTE — Telephone Encounter (Signed)
 Called ad spoke with patient, she is unsure if she took her BP med, patient should wait till nest dose to take medication, and keep a watch on her BP,

## 2024-01-06 ENCOUNTER — Ambulatory Visit: Admitting: Family Medicine

## 2024-01-06 ENCOUNTER — Encounter: Payer: Self-pay | Admitting: Family Medicine

## 2024-01-06 VITALS — BP 140/76 | HR 73 | Temp 98.3°F | Ht 62.0 in | Wt 157.8 lb

## 2024-01-06 DIAGNOSIS — R58 Hemorrhage, not elsewhere classified: Secondary | ICD-10-CM

## 2024-01-06 DIAGNOSIS — T148XXA Other injury of unspecified body region, initial encounter: Secondary | ICD-10-CM | POA: Diagnosis not present

## 2024-01-06 LAB — POCT INR: POC INR: 1

## 2024-01-06 NOTE — Progress Notes (Signed)
 Established Patient Office Visit   Subjective  Patient ID: Kristy Becker, female    DOB: 12/22/1950  Age: 73 y.o. MRN: [id number]  Chief Complaint  Patient presents with   Acute Visit    Patient came in today for Bruising on the left lower arm started a 1 day ago, patient denies any pain     Pt is a 73 year old female seen for acute concern.  Patient endorses bruising on left arm.  States occurred 1 day ago after bumping into a bush in her yard.  Patient denies pain but is concerned about the bruising.  Patient then mentions the area of bruising occurred prior to yesterday resolved then returned.  Patient is not on aspirin or anticoagulants.    Patient Active Problem List   Diagnosis Date Noted   Chronic rhinitis 08/14/2023   Hypertrophy of nasal turbinates 08/14/2023   Hepatic steatosis 04/06/2023   Colon cancer screening 04/21/2021   Diverticular disease of colon 04/21/2021   Family history of malignant neoplasm of digestive organs 04/21/2021   Fatigue 04/21/2021   Flatulence, eructation and gas pain 04/21/2021   Gastroesophageal reflux disease 04/21/2021   Lactose intolerance 04/21/2021   Left lower quadrant pain 04/21/2021   Lipoma of intra-abdominal organs 04/21/2021   Other specified conditions associated with female genital organs and menstrual cycle 04/21/2021   Glaucoma 09/17/2020   Pseudophakia of both eyes 03/25/2020   Primary open-angle glaucoma 07/11/2019   Age-related nuclear cataract of both eyes 07/10/2019   AKI (acute kidney injury) (HCC) 12/07/2016   Hyponatremia 12/07/2016   Syncope due to orthostatic hypotension    History of colonic polyps 02/26/2009   Allergic rhinitis 10/06/2007   Backache 08/21/2007   ECZEMA, HANDS 05/08/2007   Anxiety state 01/31/2007   Essential hypertension 01/31/2007   Past Medical History:  Diagnosis Date   ALLERGIC RHINITIS 10/06/2007   ANXIETY 01/31/2007   BACK PAIN 08/21/2007   COLONIC POLYPS, HX OF 02/26/2009    ECZEMA, HANDS 05/08/2007   Glaucoma    HYPERTENSION 01/31/2007   Past Surgical History:  Procedure Laterality Date   TONSILLECTOMY AND ADENOIDECTOMY     Social History   Tobacco Use   Smoking status: Never   Smokeless tobacco: Never  Substance Use Topics   Alcohol use: No   Drug use: No   History reviewed. No pertinent family history. Allergies  Allergen Reactions   Azithromycin     nausea   Cephalosporins Other (See Comments)    unknown   Ciprofloxacin  Hives   Clarithromycin Other (See Comments)    unknown   Doxycycline Hyclate Other (See Comments)   Omeprazole-Sodium Bicarbonate Other (See Comments)   Penicillins Hives   Sulfamethoxazole-Trimethoprim Other (See Comments)    ROS Negative unless stated above    Objective:     BP (!) 140/76 (BP Location: Left Arm, Patient Position: Sitting, Cuff Size: Normal)   Pulse 73   Temp 98.3 F (36.8 C) (Oral)   Ht 5' 2 (1.575 m)   Wt 157 lb 12.8 oz (71.6 kg)   SpO2 95%   BMI 28.86 kg/m  BP Readings from Last 3 Encounters:  01/06/24 (!) 140/76  12/08/23 (!) 144/76  08/18/23 136/82   Wt Readings from Last 3 Encounters:  01/06/24 157 lb 12.8 oz (71.6 kg)  12/08/23 157 lb 6.4 oz (71.4 kg)  09/26/23 157 lb (71.2 kg)      Physical Exam Constitutional:      General: She is not in acute  distress.    Appearance: Normal appearance.  HENT:     Head: Normocephalic and atraumatic.     Nose: Nose normal.     Mouth/Throat:     Mouth: Mucous membranes are moist.  Cardiovascular:     Rate and Rhythm: Normal rate and regular rhythm.     Heart sounds: Normal heart sounds. No murmur heard.    No gallop.  Pulmonary:     Effort: Pulmonary effort is normal. No respiratory distress.     Breath sounds: Normal breath sounds. No wheezing, rhonchi or rales.  Skin:    General: Skin is warm and dry.     Findings: Abrasion and ecchymosis present.         Comments: Left lateral forearm with 2 punctures with eschar and  surrounding ecchymosis.  A linear abrasion proximal to the area.  2 smaller areas of older ecchymosis on left upper arm.  Neurological:     Mental Status: She is alert and oriented to person, place, and time.        01/06/2024   11:52 AM 12/08/2023   10:35 AM 09/26/2023    2:13 PM  Depression screen PHQ 2/9  Decreased Interest 0 0 0  Down, Depressed, Hopeless 0 1 0  PHQ - 2 Score 0 1 0  Altered sleeping 0 0 0  Tired, decreased energy 0 0 0  Change in appetite 0 0 0  Feeling bad or failure about yourself  0 0 0  Trouble concentrating 0 0 0  Moving slowly or fidgety/restless 0 0 0  Suicidal thoughts 0 0 0  PHQ-9 Score 0 1 0  Difficult doing work/chores Not difficult at all Not difficult at all Not difficult at all      01/06/2024   11:52 AM 12/08/2023   10:35 AM 08/18/2023   11:13 AM 05/16/2023    2:54 PM  GAD 7 : Generalized Anxiety Score  Nervous, Anxious, on Edge 1 1 1 1   Control/stop worrying 1 0 0 1  Worry too much - different things 0 0 1 1  Trouble relaxing 0 0 0 0  Restless 0 0 0 0  Easily annoyed or irritable 0 0 0 0  Afraid - awful might happen 0 1 0 0  Total GAD 7 Score 2 2 2 3   Anxiety Difficulty Not difficult at all  Not difficult at all Not difficult at all     Results for orders placed or performed in visit on 01/06/24  POC INR  Result Value Ref Range   INR     POC INR 1.0       Assessment & Plan:   Ecchymosis of forearm -     POCT INR  Puncture wound  Ecchymosis of left forearm 2/2 puncture wound from a bush.  Continue supportive care.  Labs from 12/08/2023 including CMP, iron studies, CBC and TSH reviewed.  No obvious cause for increased bruising.  POC INR obtained in clinic, normal at 1.0.  Patient given precautions.  Pt advised to get Tdap  this visit, but declined.  Last given 01/15/2010.  Return if symptoms worsen or fail to improve.    Clotilda JONELLE Single, MD

## 2024-01-06 NOTE — Patient Instructions (Signed)
 Your INR was 1.0 in clinic.  This is normal and not contributing to your bruising.

## 2024-03-09 ENCOUNTER — Ambulatory Visit: Admitting: Family Medicine

## 2024-03-09 ENCOUNTER — Encounter: Payer: Self-pay | Admitting: Family Medicine

## 2024-03-09 VITALS — BP 118/60 | HR 60 | Temp 98.1°F | Ht 62.0 in | Wt 159.2 lb

## 2024-03-09 DIAGNOSIS — I10 Essential (primary) hypertension: Secondary | ICD-10-CM | POA: Diagnosis not present

## 2024-03-09 DIAGNOSIS — R29898 Other symptoms and signs involving the musculoskeletal system: Secondary | ICD-10-CM | POA: Diagnosis not present

## 2024-03-09 DIAGNOSIS — F411 Generalized anxiety disorder: Secondary | ICD-10-CM

## 2024-03-09 DIAGNOSIS — H04123 Dry eye syndrome of bilateral lacrimal glands: Secondary | ICD-10-CM

## 2024-03-09 DIAGNOSIS — J3089 Other allergic rhinitis: Secondary | ICD-10-CM | POA: Diagnosis not present

## 2024-03-09 NOTE — Progress Notes (Signed)
 Established Patient Office Visit   Subjective  Patient ID: Kristy Becker, female    DOB: Dec 27, 1950  Age: 73 y.o. MRN: [id number]  Chief Complaint  Patient presents with   Medical Management of Chronic Issues    Patient came in today for 3 month follow-up for Anxiety, and Blood Pressure(156/62 at home), hair loss, patient would like a flu shot     Pt is a 73 yo female seen for f/u.  Pt state bp has been elevated after the recent death of her brother.  Pt is still working on diet changes and is exercising with a trainer 2 x per wk.  She is considering exercising 3 times per wk.  Pt mentions her leg occasionally gives out while brushing her teeth or standing at the kitchen sink washing dishes.  Denies LLE pain, edema, arthritis, paresthesia, low back pain.  Dealing with dry eyes and is undergoing treatment at [facility], with a follow-up appointment scheduled for October 14th. The treatment involves addressing issues with her tear ducts, which have been described as having a 'toothpaste-like' consistency. The treatment is painful but has shown some improvement in her tear duct condition.    Patient Active Problem List   Diagnosis Date Noted   Chronic rhinitis 08/14/2023   Hypertrophy of nasal turbinates 08/14/2023   Hepatic steatosis 04/06/2023   Colon cancer screening 04/21/2021   Diverticular disease of colon 04/21/2021   Family history of malignant neoplasm of digestive organs 04/21/2021   Fatigue 04/21/2021   Flatulence, eructation and gas pain 04/21/2021   Gastroesophageal reflux disease 04/21/2021   Lactose intolerance 04/21/2021   Left lower quadrant pain 04/21/2021   Lipoma of intra-abdominal organs 04/21/2021   Other specified conditions associated with female genital organs and menstrual cycle 04/21/2021   Glaucoma 09/17/2020   Pseudophakia of both eyes 03/25/2020   Primary open-angle glaucoma 07/11/2019   Age-related nuclear cataract of both eyes 07/10/2019   AKI (acute  kidney injury) 12/07/2016   Hyponatremia 12/07/2016   Syncope due to orthostatic hypotension    History of colonic polyps 02/26/2009   Allergic rhinitis 10/06/2007   Backache 08/21/2007   ECZEMA, HANDS 05/08/2007   Anxiety state 01/31/2007   Essential hypertension 01/31/2007   Past Medical History:  Diagnosis Date   ALLERGIC RHINITIS 10/06/2007   ANXIETY 01/31/2007   BACK PAIN 08/21/2007   COLONIC POLYPS, HX OF 02/26/2009   ECZEMA, HANDS 05/08/2007   Glaucoma    HYPERTENSION 01/31/2007   Past Surgical History:  Procedure Laterality Date   TONSILLECTOMY AND ADENOIDECTOMY     Social History   Tobacco Use   Smoking status: Never   Smokeless tobacco: Never  Substance Use Topics   Alcohol use: No   Drug use: No   History reviewed. No pertinent family history. Allergies  Allergen Reactions   Azithromycin     nausea   Cephalosporins Other (See Comments)    unknown   Ciprofloxacin  Hives   Clarithromycin Other (See Comments)    unknown   Doxycycline Hyclate Other (See Comments)   Omeprazole-Sodium Bicarbonate Other (See Comments)   Penicillins Hives   Sulfamethoxazole-Trimethoprim Other (See Comments)    ROS Negative unless stated above    Objective:     BP 118/60 (BP Location: Right Arm, Patient Position: Sitting, Cuff Size: Normal)   Pulse 60   Temp 98.1 F (36.7 C) (Oral)   Ht 5' 2 (1.575 m)   Wt 159 lb 3.2 oz (72.2 kg)   SpO2 98%  BMI 29.12 kg/m  BP Readings from Last 3 Encounters:  03/09/24 118/60  01/06/24 (!) 140/76  12/08/23 (!) 144/76   Wt Readings from Last 3 Encounters:  03/09/24 159 lb 3.2 oz (72.2 kg)  01/06/24 157 lb 12.8 oz (71.6 kg)  12/08/23 157 lb 6.4 oz (71.4 kg)      Physical Exam Constitutional:      General: She is not in acute distress.    Appearance: Normal appearance.  HENT:     Head: Normocephalic and atraumatic.     Nose: Nose normal.     Mouth/Throat:     Mouth: Mucous membranes are moist.  Cardiovascular:     Rate  and Rhythm: Normal rate and regular rhythm.     Heart sounds: Normal heart sounds. No murmur heard.    No gallop.  Pulmonary:     Effort: Pulmonary effort is normal. No respiratory distress.     Breath sounds: Normal breath sounds. No wheezing, rhonchi or rales.  Musculoskeletal:        General: No swelling, tenderness or deformity. Normal range of motion.  Skin:    General: Skin is warm and dry.  Neurological:     Mental Status: She is alert and oriented to person, place, and time.        03/09/2024   11:19 AM 01/06/2024   11:52 AM 12/08/2023   10:35 AM  Depression screen PHQ 2/9  Decreased Interest 0 0 0  Down, Depressed, Hopeless 0 0 1  PHQ - 2 Score 0 0 1  Altered sleeping 0 0 0  Tired, decreased energy 0 0 0  Change in appetite 0 0 0  Feeling bad or failure about yourself  0 0 0  Trouble concentrating 0 0 0  Moving slowly or fidgety/restless 0 0 0  Suicidal thoughts 0 0 0  PHQ-9 Score 0 0 1  Difficult doing work/chores Not difficult at all Not difficult at all Not difficult at all      03/09/2024   11:19 AM 01/06/2024   11:52 AM 12/08/2023   10:35 AM 08/18/2023   11:13 AM  GAD 7 : Generalized Anxiety Score  Nervous, Anxious, on Edge 0 1 1 1   Control/stop worrying 0 1 0 0  Worry too much - different things 1 0 0 1  Trouble relaxing 0 0 0 0  Restless 0 0 0 0  Easily annoyed or irritable 0 0 0 0  Afraid - awful might happen 0 0 1 0  Total GAD 7 Score 1 2 2 2   Anxiety Difficulty Not difficult at all Not difficult at all  Not difficult at all     No results found for any visits on 03/09/24.    Assessment & Plan:   Environmental and seasonal allergies  GAD (generalized anxiety disorder)  Essential hypertension  Weakness of left lower extremity  Dry eyes  Increased allergic rhinitis symptoms.  Patient encouraged to reconsider allergy shots.  Continue current allergy medications including Singulair , Allegra , azelastine .  Consider switching to different OTC  p.o. antihistamine.  Patient encouraged to follow-up with allergist.  Anxiety stable.  GAD-7 score 1 this visit.  Likely increase anxiety/depression symptoms with recent death of her brother and with having to come into the office.  Continue self-care.  BP initially elevated.  On recheck controlled.  Anxiety likely contributing to elevation.  Continue current medications including benazepril -hydrochlorothiazide  20-12.5 mg daily.  Continue lifestyle modifications.  Continue to monitor BP at home and keep  a log bring with you to clinic.  Continue follow-up with [facility].  Continue current eyedrops including Miebo, refresh, Xiidra.  Return if symptoms worsen or fail to improve.   Clotilda JONELLE Single, MD

## 2024-03-29 DIAGNOSIS — H401133 Primary open-angle glaucoma, bilateral, severe stage: Secondary | ICD-10-CM | POA: Diagnosis not present

## 2024-04-17 ENCOUNTER — Ambulatory Visit: Payer: Self-pay

## 2024-04-17 NOTE — Telephone Encounter (Signed)
 FYI Only or Action Required?: Action required by provider: Can't remember if she took her BP medication today..  Patient was last seen in primary care on 03/09/2024 by Kristy Clotilda SAUNDERS, MD.  Called Nurse Triage reporting Medication question.  Symptoms began today.  Interventions attempted: Nothing.  Symptoms are: stable. Pt. Will monitor BP today. If it is elevated, will take her medication.  Triage Disposition: Home Care  Patient/caregiver understands and will follow disposition?: Yes    Copied from CRM #[id number]. Topic: Clinical - Medical Advice >> Apr 17, 2024  9:17 AM Kristy Becker wrote: Reason for CRM: patient stated she wasnt sure if she took her bp medicine today and wanted to know do she need to not take or take it again just in case. Reason for Disposition  [1] DOUBLE DOSE (an extra dose or lesser amount) of antibiotic drug AND [2] NO symptoms  Answer Assessment - Initial Assessment Questions 1. NAME of MEDICINE: What medicine(s) are you calling about?     BP medication 2. QUESTION: What is your question? (e.g., double dose of medicine, side effect)     Can't remember if she took today's dose. 3. PRESCRIBER: Who prescribed the medicine? Reason: if prescribed by specialist, call should be referred to that group.     [facility] 4. SYMPTOMS: Do you have any symptoms? If Yes, ask: What symptoms are you having?  How bad are the symptoms (e.g., mild, moderate, severe)     N/a 5. PREGNANCY:  Is there any chance that you are pregnant? When was your last menstrual period?     no  Protocols used: Medication Question Call-A-AH

## 2024-04-17 NOTE — Telephone Encounter (Signed)
 Called patient and left a VM, patient should wait till next dose to take Bp medication. To keep from double dosing medication.

## 2024-04-18 ENCOUNTER — Telehealth: Payer: Self-pay

## 2024-04-18 NOTE — Telephone Encounter (Signed)
 Per Dr. Mercer RSV is okay, patient is aware

## 2024-04-18 NOTE — Telephone Encounter (Signed)
 Copied from CRM #[id number]. Topic: Clinical - Medical Advice >> Apr 18, 2024  3:24 PM Mesmerise C wrote: Reason for CRM: Patient inquiring if she needs to get RSV vaccine, was told by pharmacist she needs to have it done and wants to check to make sure if she does if so states will go to the pharmacy on Friday patient would like a call back tomorrow to know

## 2024-06-08 ENCOUNTER — Telehealth: Payer: Self-pay | Admitting: Family Medicine

## 2024-06-08 NOTE — Telephone Encounter (Signed)
 Copied from CRM #[id number]. Topic: Referral - Request for Referral >> Jun 08, 2024 11:08 AM Robinson H wrote: Did the patient discuss referral with their provider in the last year? Yes, (If No - schedule appointment) (If Yes - send message)  Appointment offered? No  Type of order/referral and detailed reason for visit: Obgyne, patients current provider left all of a sudden, not having any issues just has to find a new provider for yearly visits  Preference of office, provider, location: Who ever provider recommends  If referral order, have you been seen by this specialty before? Yes, [facility] [address] but provider left (If Yes, this issue or another issue? When? Where?  [facility] [address] but provider left   Can we respond through MyChart? No

## 2024-06-12 NOTE — Telephone Encounter (Signed)
 Spoke with patient referred her to [facility]

## 2024-10-01 ENCOUNTER — Ambulatory Visit
